# Patient Record
Sex: Female | Born: 1950 | Race: White | Hispanic: No | Smoking: Never smoker
Health system: Southern US, Community
[De-identification: ages and names within clinical notes are randomized; demographics above are authoritative.]

## PROBLEM LIST (undated history)

## (undated) DIAGNOSIS — R739 Hyperglycemia, unspecified: Secondary | ICD-10-CM

## (undated) DIAGNOSIS — C4490 Unspecified malignant neoplasm of skin, unspecified: Secondary | ICD-10-CM

## (undated) DIAGNOSIS — K219 Gastro-esophageal reflux disease without esophagitis: Secondary | ICD-10-CM

## (undated) DIAGNOSIS — K635 Polyp of colon: Secondary | ICD-10-CM

## (undated) DIAGNOSIS — E559 Vitamin D deficiency, unspecified: Secondary | ICD-10-CM

## (undated) HISTORY — PX: DEBRIDEMENT TENNIS ELBOW: SHX1442

## (undated) HISTORY — PX: EYE SURGERY: SHX253

## (undated) HISTORY — DX: Gastro-esophageal reflux disease without esophagitis: K21.9

## (undated) HISTORY — DX: Unspecified malignant neoplasm of skin, unspecified: C44.90

## (undated) HISTORY — DX: Hyperglycemia, unspecified: R73.9

## (undated) HISTORY — DX: Polyp of colon: K63.5

## (undated) HISTORY — PX: MOHS SURGERY: SUR867

## (undated) HISTORY — DX: Vitamin D deficiency, unspecified: E55.9

## (undated) HISTORY — PX: BREAST BIOPSY: SHX20

---
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
## 2005-04-12 ENCOUNTER — Ambulatory Visit: Payer: Self-pay | Admitting: Unknown Physician Specialty

## 2005-05-11 ENCOUNTER — Ambulatory Visit: Payer: Self-pay | Admitting: Obstetrics and Gynecology

## 2005-12-05 ENCOUNTER — Ambulatory Visit: Payer: Self-pay | Admitting: Unknown Physician Specialty

## 2006-09-18 ENCOUNTER — Ambulatory Visit: Payer: Self-pay

## 2006-10-04 ENCOUNTER — Ambulatory Visit: Payer: Self-pay | Admitting: Physician Assistant

## 2006-10-29 ENCOUNTER — Encounter: Payer: Self-pay | Admitting: General Practice

## 2007-12-06 ENCOUNTER — Ambulatory Visit: Payer: Self-pay

## 2007-12-24 ENCOUNTER — Ambulatory Visit: Payer: Self-pay | Admitting: Otolaryngology

## 2007-12-25 ENCOUNTER — Ambulatory Visit: Payer: Self-pay

## 2008-12-30 ENCOUNTER — Ambulatory Visit: Payer: Self-pay

## 2010-03-29 ENCOUNTER — Ambulatory Visit: Payer: Self-pay

## 2011-05-22 ENCOUNTER — Ambulatory Visit: Payer: Self-pay

## 2011-07-20 ENCOUNTER — Ambulatory Visit: Payer: Self-pay | Admitting: Unknown Physician Specialty

## 2011-07-21 LAB — PATHOLOGY REPORT

## 2012-04-01 LAB — BASIC METABOLIC PANEL
BUN: 16 mg/dL (ref 4–21)
Sodium: 140 mmol/L (ref 137–147)

## 2012-04-01 LAB — LIPID PANEL
Cholesterol: 192 mg/dL (ref 0–200)
HDL: 54 mg/dL (ref 35–70)
Triglycerides: 75 mg/dL (ref 40–160)

## 2012-04-01 LAB — HM PAP SMEAR

## 2012-04-01 LAB — HEPATIC FUNCTION PANEL: Bilirubin, Total: 0.4 mg/dL

## 2012-05-28 ENCOUNTER — Ambulatory Visit: Payer: Self-pay

## 2012-08-02 ENCOUNTER — Encounter: Payer: Self-pay | Admitting: *Deleted

## 2012-08-05 ENCOUNTER — Ambulatory Visit: Payer: Self-pay | Admitting: Internal Medicine

## 2012-08-12 ENCOUNTER — Ambulatory Visit (INDEPENDENT_AMBULATORY_CARE_PROVIDER_SITE_OTHER): Payer: Managed Care, Other (non HMO) | Admitting: Internal Medicine

## 2012-08-12 ENCOUNTER — Encounter: Payer: Self-pay | Admitting: Internal Medicine

## 2012-08-12 VITALS — BP 122/76 | HR 70 | Temp 98.5°F | Ht 64.0 in | Wt 177.0 lb

## 2012-08-12 DIAGNOSIS — Z13220 Encounter for screening for lipoid disorders: Secondary | ICD-10-CM

## 2012-08-12 DIAGNOSIS — K219 Gastro-esophageal reflux disease without esophagitis: Secondary | ICD-10-CM

## 2012-08-12 DIAGNOSIS — R739 Hyperglycemia, unspecified: Secondary | ICD-10-CM

## 2012-08-12 DIAGNOSIS — Z86010 Personal history of colonic polyps: Secondary | ICD-10-CM

## 2012-08-12 DIAGNOSIS — R7309 Other abnormal glucose: Secondary | ICD-10-CM

## 2012-08-12 DIAGNOSIS — E559 Vitamin D deficiency, unspecified: Secondary | ICD-10-CM

## 2012-08-12 DIAGNOSIS — C4490 Unspecified malignant neoplasm of skin, unspecified: Secondary | ICD-10-CM

## 2012-08-13 ENCOUNTER — Encounter: Payer: Self-pay | Admitting: Internal Medicine

## 2012-08-13 DIAGNOSIS — K219 Gastro-esophageal reflux disease without esophagitis: Secondary | ICD-10-CM | POA: Insufficient documentation

## 2012-08-13 DIAGNOSIS — Z86010 Personal history of colonic polyps: Secondary | ICD-10-CM | POA: Insufficient documentation

## 2012-08-13 DIAGNOSIS — R739 Hyperglycemia, unspecified: Secondary | ICD-10-CM | POA: Insufficient documentation

## 2012-08-13 DIAGNOSIS — E559 Vitamin D deficiency, unspecified: Secondary | ICD-10-CM | POA: Insufficient documentation

## 2012-08-13 DIAGNOSIS — C4490 Unspecified malignant neoplasm of skin, unspecified: Secondary | ICD-10-CM | POA: Insufficient documentation

## 2012-08-13 NOTE — Assessment & Plan Note (Signed)
Symptoms controlled if she watches what she eats.  Takes ranitidine.  Follow.

## 2012-08-13 NOTE — Assessment & Plan Note (Signed)
Followed by dermatology

## 2012-08-13 NOTE — Assessment & Plan Note (Signed)
Check vitamin D level 

## 2012-08-13 NOTE — Progress Notes (Signed)
  Subjective:    Patient ID: Lori Guerrero, female    DOB: 1950/10/06, 62 y.o.   MRN: [id number]  HPI 62 year old female with past history of GERD, hyperglycemia and colonic polyps who comes in today to follow up on these issues as well as to establish care.  Has seen Dr Marguerite Olea previously.  Also sees GYN.  (seeing Acquanetta Belling).  Up to date with her gyn exams.  Last pelvic - 9/13.  Has never had an abnormal pap smear.  She reports noticing some occasional heart fluttering.  Rare.  Has had a stress test previously.  Negative.  No change in symptoms.  No sob.  No chest pain.  No acid reflux now.  Was previously on medication.  If she watches what she eats, no symptoms.  Has some issues with her left knee.  Has seen Dr Ernest Pine.   Takes glucosamine.  Helps.  Weight bearing - bothers.  Bowels regular.     Past Medical History  Diagnosis Date  . GERD (gastroesophageal reflux disease)   . Hyperglycemia   . Vitamin D deficiency   . Hyperplastic colon polyp   . Skin cancer     Current Outpatient Prescriptions on File Prior to Visit  Medication Sig Dispense Refill  . Multiple Vitamins-Minerals (MULTIVITAMIN PO) Take by mouth daily.      . naproxen sodium (ANAPROX) 220 MG tablet Take 220 mg by mouth as needed. 1 to 2 capsules as needed      . ranitidine (ZANTAC) 150 MG capsule Take 150 mg by mouth daily as needed.        No current facility-administered medications on file prior to visit.    Review of Systems Patient denies any headache, lightheadedness or dizziness.  No sinus or allergy symptoms.  No chest pain, tightness or significant palpitations.  No increased shortness of breath, cough or congestion.  No nausea or vomiting.  No significant acid reflux.  No abdominal pain or cramping.  No bowel change, such as diarrhea, constipation, BRBPR or melana.  No urine change.        Objective:   Physical Exam Filed Vitals:   08/12/12 0934  BP: 122/76  Pulse: 70  Temp: 98.5 F (66.52 C)   62  year old female in no acute distress.   HEENT:  Nares- clear.  Oropharynx - without lesions. NECK:  Supple.  Nontender.  No audible bruit.  HEART:  Appears to be regular. LUNGS:  No crackles or wheezing audible.  Respirations even and unlabored.  RADIAL PULSE:  Equal bilaterally.    BREASTS: performed through gyn.  ABDOMEN:  Soft, nontender.  Bowel sounds present and normal.  No audible abdominal bruit.  GU:  Performed through gyn.    EXTREMITIES:  No increased edema present.  DP pulses palpable and equal bilaterally.       MSK:  No significant pain to palpation over the knee.  No swelling.      Assessment & Plan:  MSK.  Intermittent knee pain.  Has seen Dr Ernest Pine.  Stable.  Desires no further intervention.   CARDIOVASCULAR.  Has had stress test.  Negative.  Occasional fluttering.  Rare.  No recent change in symptoms.  Follow.    HEALTH MAINTENANCE.  Had her physical with gyn 9/13.  Mammogram 05/28/12.  Colonoscopy 07/20/11.  Due follow up colonoscopy 2018.

## 2012-08-13 NOTE — Assessment & Plan Note (Signed)
Low carb diet and exercise.  Check metabolic panel and a1c.   

## 2012-08-13 NOTE — Assessment & Plan Note (Signed)
Up to date with colonoscopy.  See above.  Due follow up colonoscopy 2018.

## 2012-08-14 ENCOUNTER — Other Ambulatory Visit (INDEPENDENT_AMBULATORY_CARE_PROVIDER_SITE_OTHER): Payer: Managed Care, Other (non HMO)

## 2012-08-14 DIAGNOSIS — R7309 Other abnormal glucose: Secondary | ICD-10-CM

## 2012-08-14 DIAGNOSIS — Z13220 Encounter for screening for lipoid disorders: Secondary | ICD-10-CM

## 2012-08-14 DIAGNOSIS — R739 Hyperglycemia, unspecified: Secondary | ICD-10-CM

## 2012-08-14 LAB — CBC WITH DIFFERENTIAL/PLATELET
Basophils Absolute: 0 10*3/uL (ref 0.0–0.1)
HCT: 43.8 % (ref 36.0–46.0)
Hemoglobin: 15.1 g/dL — ABNORMAL HIGH (ref 12.0–15.0)
Lymphs Abs: 1.3 10*3/uL (ref 0.7–4.0)
MCHC: 34.4 g/dL (ref 30.0–36.0)
MCV: 93.3 fl (ref 78.0–100.0)
Monocytes Absolute: 0.3 10*3/uL (ref 0.1–1.0)
Monocytes Relative: 5.6 % (ref 3.0–12.0)
Neutro Abs: 3 10*3/uL (ref 1.4–7.7)
Platelets: 253 10*3/uL (ref 150.0–400.0)
RDW: 13.2 % (ref 11.5–14.6)

## 2012-08-14 LAB — BASIC METABOLIC PANEL
CO2: 27 mEq/L (ref 19–32)
Calcium: 9.4 mg/dL (ref 8.4–10.5)
Creatinine, Ser: 1 mg/dL (ref 0.4–1.2)
GFR: 62.16 mL/min (ref >60.00)
Sodium: 139 mEq/L (ref 135–145)

## 2012-08-14 LAB — LIPID PANEL
Cholesterol: 200 mg/dL (ref 0–200)
LDL Cholesterol: 128 mg/dL — ABNORMAL HIGH (ref 0–99)
Total CHOL/HDL Ratio: 4

## 2012-08-19 ENCOUNTER — Encounter: Payer: Self-pay | Admitting: *Deleted

## 2012-08-26 ENCOUNTER — Telehealth: Payer: Self-pay | Admitting: Internal Medicine

## 2012-08-26 NOTE — Telephone Encounter (Signed)
Called patient back and gave her the number for billing.

## 2012-08-26 NOTE — Telephone Encounter (Signed)
Pt states she received a bill after we filed her visit with [employer].  Pt states ins states it is the way it was billed/coding issue.  Offered pt billing phone number but  She refused as she states it is something Dr. Stann Mainland have to code differently.  Please advise.  Please call pt on cell [phone].

## 2013-03-12 ENCOUNTER — Ambulatory Visit: Payer: Managed Care, Other (non HMO) | Admitting: Internal Medicine

## 2013-04-06 ENCOUNTER — Encounter: Payer: Self-pay | Admitting: Internal Medicine

## 2013-04-14 ENCOUNTER — Ambulatory Visit (INDEPENDENT_AMBULATORY_CARE_PROVIDER_SITE_OTHER): Payer: Managed Care, Other (non HMO) | Admitting: Internal Medicine

## 2013-04-14 ENCOUNTER — Encounter: Payer: Self-pay | Admitting: Internal Medicine

## 2013-04-14 VITALS — BP 122/80 | HR 64 | Temp 98.1°F | Ht 64.0 in | Wt 174.8 lb

## 2013-04-14 DIAGNOSIS — Z8379 Family history of other diseases of the digestive system: Secondary | ICD-10-CM

## 2013-04-14 DIAGNOSIS — Z860100 Personal history of colon polyps, unspecified: Secondary | ICD-10-CM

## 2013-04-14 DIAGNOSIS — C4490 Unspecified malignant neoplasm of skin, unspecified: Secondary | ICD-10-CM

## 2013-04-14 DIAGNOSIS — R109 Unspecified abdominal pain: Secondary | ICD-10-CM | POA: Insufficient documentation

## 2013-04-14 DIAGNOSIS — R7309 Other abnormal glucose: Secondary | ICD-10-CM

## 2013-04-14 DIAGNOSIS — R739 Hyperglycemia, unspecified: Secondary | ICD-10-CM

## 2013-04-14 DIAGNOSIS — E559 Vitamin D deficiency, unspecified: Secondary | ICD-10-CM

## 2013-04-14 DIAGNOSIS — K219 Gastro-esophageal reflux disease without esophagitis: Secondary | ICD-10-CM

## 2013-04-14 DIAGNOSIS — R1030 Lower abdominal pain, unspecified: Secondary | ICD-10-CM | POA: Insufficient documentation

## 2013-04-14 LAB — BASIC METABOLIC PANEL
BUN: 17 mg/dL (ref 6–23)
Calcium: 9.1 mg/dL (ref 8.4–10.5)
Chloride: 109 mEq/L (ref 96–112)
GFR: 74.25 mL/min (ref >60.00)
Glucose, Bld: 109 mg/dL — ABNORMAL HIGH (ref 70–99)
Sodium: 142 mEq/L (ref 135–145)

## 2013-04-14 LAB — HEPATIC FUNCTION PANEL
ALT: 22 U/L (ref 0–35)
Albumin: 3.8 g/dL (ref 3.5–5.2)
Alkaline Phosphatase: 91 U/L (ref 39–117)
Total Bilirubin: 0.7 mg/dL (ref 0.3–1.2)

## 2013-04-14 LAB — HEMOGLOBIN A1C: Hgb A1c MFr Bld: 5.9 % (ref 4.6–6.5)

## 2013-04-14 NOTE — Assessment & Plan Note (Signed)
Followed by dermatology

## 2013-04-14 NOTE — Assessment & Plan Note (Signed)
Low carb diet and exercise.  Check metabolic panel and a1c.   

## 2013-04-14 NOTE — Progress Notes (Signed)
  Subjective:    Patient ID: Lori Guerrero, female    DOB: 10-Oct-1950, 62 y.o.   MRN: [id number]  HPI 62 year old female with past history of GERD, hyperglycemia and colonic polyps who comes in today for a scheduled follow up.  Sees GYN.  (seeing Acquanetta Belling).  Up to date with her gyn exams.  Last pelvic - 9/14.  Has never had an abnormal pap smear.  Has had a stress test previously.  Negative.  No change in symptoms.  No sob.  No chest pain.  No acid reflux now.  Was previously on medication.  If she watches what she eats, no symptoms.   Bowels regular.  She reports some intermittent lower abdominal pain.  Flares intermittently.  May last hours and then resolves.  No precipitating factors.  No bowel change associated.  Just evaluated by gyn.  States everything checked out fine.  She also reports her sister and daughter have some liver condition.  She does not know name.      Past Medical History  Diagnosis Date  . GERD (gastroesophageal reflux disease)   . Hyperglycemia   . Vitamin D deficiency   . Hyperplastic colon polyp   . Skin cancer     Current Outpatient Prescriptions on File Prior to Visit  Medication Sig Dispense Refill  . Multiple Vitamins-Minerals (MULTIVITAMIN PO) Take by mouth daily.      . naproxen sodium (ANAPROX) 220 MG tablet Take 220 mg by mouth as needed. 1 to 2 capsules as needed      . ranitidine (ZANTAC) 150 MG capsule Take 150 mg by mouth daily as needed.       Marland Kitchen GARCINIA CAMBOGIA-CHROMIUM PO Take 2 by mouth three times a day       No current facility-administered medications on file prior to visit.    Review of Systems Patient denies any headache, lightheadedness or dizziness.  No sinus or allergy symptoms.  No chest pain, tightness or palpitations.  No increased shortness of breath, cough or congestion.  No nausea or vomiting.  No significant acid reflux. Intermittent flares of lower abdominal discomfort.   No bowel change, such as diarrhea, constipation, BRBPR  or melana.  No urine change.  Recent gyn evaluation ok.      Objective:   Physical Exam  Filed Vitals:   04/14/13 0827  BP: 122/80  Pulse: 64  Temp: 98.1 F (36.7 C)   Blood pressure recheck:  120/76, pulse 38  62 year old female in no acute distress.   HEENT:  Nares- clear.  Oropharynx - without lesions. NECK:  Supple.  Nontender.  No audible bruit.  HEART:  Appears to be regular. LUNGS:  No crackles or wheezing audible.  Respirations even and unlabored.  RADIAL PULSE:  Equal bilaterally.   ABDOMEN:  Soft, nontender.  Bowel sounds present and normal.  No audible abdominal bruit.   EXTREMITIES:  No increased edema present.  DP pulses palpable and equal bilaterally.      MSK:  No significant pain to palpation over the knee.  No swelling.      Assessment & Plan:  MSK.  Intermittent knee pain.  Has seen Dr Ernest Pine.  Stable.  Desires no further intervention.   CARDIOVASCULAR.  Has had stress test.  Negative.  No recent change in symptoms.  Follow.    HEALTH MAINTENANCE.  Had her physical with gyn 9/14.  Mammogram 05/28/12.  Colonoscopy 07/20/11.  Due follow up colonoscopy 2018.

## 2013-04-14 NOTE — Assessment & Plan Note (Signed)
Persistent intermittent pain.  Last episode - last week.  Discussed further w/up including pelvic ultrasound.  She thinks she has had a recent pelvic ultrasound and wants to hold on further w/up at this time.  Will notify me if she changes her mind.  Will monitor for possible triggers.

## 2013-04-14 NOTE — Assessment & Plan Note (Signed)
Check vitamin D level 

## 2013-04-14 NOTE — Assessment & Plan Note (Signed)
Up to date with colonoscopy.  See above.  Due follow up colonoscopy 2018.

## 2013-04-14 NOTE — Assessment & Plan Note (Signed)
Symptoms controlled if she watches what she eats.  Takes ranitidine.  Follow.

## 2013-04-15 ENCOUNTER — Encounter: Payer: Self-pay | Admitting: Internal Medicine

## 2013-04-21 NOTE — Telephone Encounter (Signed)
Mailed unread message to pt  

## 2013-04-24 ENCOUNTER — Telehealth: Payer: Self-pay | Admitting: Internal Medicine

## 2013-04-24 NOTE — Telephone Encounter (Signed)
noted 

## 2013-04-24 NOTE — Telephone Encounter (Signed)
Patient Information:  Caller Name: Matti  Phone: [phone]  Patient: Lori Guerrero, Lori Guerrero  Gender: Female  DOB: [birth year]  Age: 62 Years  PCP: Dale Isleton  Office Follow Up:  Does the office need to follow up with this patient?: No  Instructions For The Office: N/A   Symptoms  Reason For Call & Symptoms: Patient calling, Reports she has insect bite  on right foot from  10/18; it is swollen and itchy. Dime sized red puffy area reported.  She mentioned that she had  been pulling weeds in the yard when this happened.  Emergent symptoms ruled out.  Home care per Insect Bite guideline due to Itchy Insect bite.  Caller voiced understanding.  Reviewed Health History In EMR: Yes  Reviewed Medications In EMR: Yes  Reviewed Allergies In EMR: Yes  Reviewed Surgeries / Procedures: Yes  Date of Onset of Symptoms: 04/19/2013  Treatments Tried: Witch Hazel  Treatments Tried Worked: No  Guideline(s) Used:  IT sales professional  Disposition Per Guideline:   Home Care  Reason For Disposition Reached:   Itchy insect bite  Advice Given:  Local Treatment - Itchy Insect Bites  Apply calamine lotion or a baking soda paste.  If the itch is severe, use 1% hydrocortisone cream. Apply 4 times a day until the itch is less severe, then switch to calamine lotion.  Oral Antihistamine Medication for Severe Itching:   Antihistamines may cause sleepiness. Do not drink, drive, or operate dangerous machinery while taking antihistamines.  Expected Course:  Most insect bites are itchy and puffy for several days.  Call Back If:  Severe pain lasts over 2 hours after pain medicine  Bite looks infected (redness, red streaks, increased tenderness)  Redness getting larger and more than 48 hours after the bite  Infected scab doesn't look better after 48 hours of antibiotic ointment  You become worse.  Patient Will Follow Care Advice:  YES

## 2013-04-24 NOTE — Telephone Encounter (Signed)
FYI

## 2013-04-25 ENCOUNTER — Encounter: Payer: Self-pay | Admitting: Internal Medicine

## 2013-05-12 NOTE — Telephone Encounter (Signed)
Mailed unread message to pt  

## 2013-06-10 ENCOUNTER — Ambulatory Visit: Payer: Self-pay

## 2014-04-17 ENCOUNTER — Other Ambulatory Visit: Payer: Self-pay

## 2014-06-23 ENCOUNTER — Ambulatory Visit: Payer: Self-pay

## 2014-06-23 LAB — HM MAMMOGRAPHY: HM Mammogram: NEGATIVE

## 2015-04-05 ENCOUNTER — Encounter: Payer: Self-pay | Admitting: Internal Medicine

## 2015-04-05 ENCOUNTER — Ambulatory Visit (INDEPENDENT_AMBULATORY_CARE_PROVIDER_SITE_OTHER): Payer: Managed Care, Other (non HMO) | Admitting: Internal Medicine

## 2015-04-05 VITALS — BP 120/80 | HR 50 | Temp 98.2°F | Ht 63.75 in | Wt 170.5 lb

## 2015-04-05 DIAGNOSIS — R739 Hyperglycemia, unspecified: Secondary | ICD-10-CM | POA: Diagnosis not present

## 2015-04-05 DIAGNOSIS — K219 Gastro-esophageal reflux disease without esophagitis: Secondary | ICD-10-CM | POA: Diagnosis not present

## 2015-04-05 DIAGNOSIS — E559 Vitamin D deficiency, unspecified: Secondary | ICD-10-CM

## 2015-04-05 DIAGNOSIS — Z658 Other specified problems related to psychosocial circumstances: Secondary | ICD-10-CM | POA: Diagnosis not present

## 2015-04-05 DIAGNOSIS — Z0000 Encounter for general adult medical examination without abnormal findings: Secondary | ICD-10-CM

## 2015-04-05 DIAGNOSIS — F439 Reaction to severe stress, unspecified: Secondary | ICD-10-CM | POA: Insufficient documentation

## 2015-04-05 DIAGNOSIS — Z86010 Personal history of colonic polyps: Secondary | ICD-10-CM

## 2015-04-05 MED ORDER — ALPRAZOLAM 0.25 MG PO TABS: 0.2500 mg | ORAL_TABLET | Freq: Every day | ORAL | Status: DC | PRN

## 2015-04-05 NOTE — Assessment & Plan Note (Addendum)
Low carb diet and exercise.  Follow met b and a1c.  Has adjusted her diet.  Lost weight.   

## 2015-04-05 NOTE — Progress Notes (Signed)
Patient ID: Lori Guerrero, female   DOB: 14-Feb-1951, 64 y.o.   MRN: [id number]   Subjective:    Patient ID: Lori Guerrero, female    DOB: 07-Apr-1951, 64 y.o.   MRN: [id number]  HPI  Patient with past history of GERD, hyperglycemia and colonic polyps.  She comes in today to follow up on these issues as well as for a complete physical exam.  She reports she is doing relatively well.  Her husband of 35 years passed away 16-Nov-2013.  She still has some grief over this.  Increased stress at work.  The best way she deals with stress is exercise.  No chest pain or tightness.  No sob. No acid reflux symptoms reported.  No abdominal pain or cramping.  No bowel change.  States at times she feels she can't remember as well, but relates this more to concentration and stress.  Feels needs some occasional xanax to have if needed.     Past Medical History  Diagnosis Date  . GERD (gastroesophageal reflux disease)   . Hyperglycemia   . Vitamin D deficiency   . Hyperplastic colon polyp   . Skin cancer    Past Surgical History  Procedure Laterality Date  . Debridement tennis elbow    . Breast biopsy    . Mohs surgery      skin cancer   Family History  Problem Relation Age of Onset  . Diabetes Mother   . Heart disease Mother   . Cancer Paternal Grandfather     colon  . Cancer Paternal Aunt     Breast cancer   Social History   Social History  . Marital Status: Widowed    Spouse Name: N/A  . Number of Children: 1  . Years of Education: N/A   Social History Main Topics  . Smoking status: Never Smoker   . Smokeless tobacco: Never Used  . Alcohol Use: 0.0 oz/week    0 Standard drinks or equivalent per week     Comment: occassionally  . Drug Use: No  . Sexual Activity: Not Asked   Other Topics Concern  . None   Social History Narrative    Outpatient Encounter Prescriptions as of 04/05/2015  Medication Sig  . Multiple Vitamins-Minerals (MULTIVITAMIN PO) Take by mouth daily.  .  naproxen sodium (ANAPROX) 220 MG tablet Take 220 mg by mouth as needed. 1 to 2 capsules as needed  . ranitidine (ZANTAC) 150 MG capsule Take 150 mg by mouth daily as needed.   . ALPRAZolam (XANAX) 0.25 MG tablet Take 1 tablet (0.25 mg total) by mouth daily as needed for anxiety.  Marland Kitchen GARCINIA CAMBOGIA-CHROMIUM PO Take 2 by mouth three times a day   No facility-administered encounter medications on file as of 04/05/2015.    Review of Systems  Constitutional: Negative for appetite change and unexpected weight change.  HENT: Negative for congestion and sinus pressure.   Eyes: Negative for pain and visual disturbance.  Respiratory: Negative for cough, chest tightness and shortness of breath.   Cardiovascular: Negative for chest pain, palpitations and leg swelling.  Gastrointestinal: Negative for nausea, vomiting, abdominal pain and diarrhea.  Genitourinary: Negative for frequency and difficulty urinating.  Musculoskeletal: Negative for back pain and joint swelling.  Skin: Negative for color change and rash.  Neurological: Negative for dizziness, light-headedness and headaches.  Hematological: Negative for adenopathy. Does not bruise/bleed easily.  Psychiatric/Behavioral: Negative for dysphoric mood and agitation.  Increased stress as outlined.         Objective:    Physical Exam  Constitutional: She is oriented to person, place, and time. She appears well-developed and well-nourished.  HENT:  Nose: Nose normal.  Mouth/Throat: Oropharynx is clear and moist.  Eyes: Right eye exhibits no discharge. Left eye exhibits no discharge. No scleral icterus.  Neck: Neck supple. No thyromegaly present.  Cardiovascular: Normal rate and regular rhythm.   Pulmonary/Chest: Breath sounds normal. No accessory muscle usage. No tachypnea. No respiratory distress. She has no decreased breath sounds. She has no wheezes. She has no rhonchi. Right breast exhibits no inverted nipple, no mass, no nipple  discharge and no tenderness (no axillary adenopathy). Left breast exhibits no inverted nipple, no mass, no nipple discharge and no tenderness (no axilarry adenopathy).  Abdominal: Soft. Bowel sounds are normal. There is no tenderness.  Genitourinary:  Performed by gyn.   Musculoskeletal: She exhibits no edema or tenderness.  Lymphadenopathy:    She has no cervical adenopathy.  Neurological: She is alert and oriented to person, place, and time.  Skin: Skin is warm. No rash noted. No erythema.  Psychiatric: She has a normal mood and affect. Her behavior is normal.    BP 120/80 mmHg  Pulse 50  Temp(Src) 98.2 F (36.8 C) (Oral)  Ht 5' 3.75" (1.619 m)  Wt 170 lb 8 oz (77.338 kg)  BMI 29.51 kg/m2  SpO2 98% Wt Readings from Last 3 Encounters:  04/05/15 170 lb 8 oz (77.338 kg)  04/14/13 174 lb 12 oz (79.266 kg)  08/12/12 177 lb (80.287 kg)     Lab Results  Component Value Date   WBC 4.7 08/14/2012   HGB 15.1* 08/14/2012   HCT 43.8 08/14/2012   PLT 253.0 08/14/2012   GLUCOSE 109* 04/14/2013   CHOL 200 08/14/2012   TRIG 112.0 08/14/2012   HDL 50.00 08/14/2012   LDLCALC 128* 08/14/2012   ALT 22 04/14/2013   AST 23 04/14/2013   NA 142 04/14/2013   K 3.9 04/14/2013   CL 109 04/14/2013   CREATININE 0.8 04/14/2013   BUN 17 04/14/2013   CO2 25 04/14/2013   HGBA1C 5.9 04/14/2013       Assessment & Plan:   Problem List Items Addressed This Visit    GERD (gastroesophageal reflux disease) - Primary    On zantac.  Upper symptoms controlled.        Health care maintenance    Was scheduled for her physical today.  Gets her pelvic, pap smears and mammograms through [facility].  Obtain records.  States she is up to date.  Follow.  Will let me know if wants to start getting physicals here.  Colonoscopy 07/20/11.  F/u colonoscopy recommended 2018.        Hyperglycemia    Low carb diet and exercise.  Follow met b and a1c.  Has adjusted her diet.  Lost weight.        Personal history  of colonic polyps    Up to date with colonoscopy.  Last 07/20/11.  Due f/u colonoscopy 2018.        Stress    Increased stress as outlined.  Gave her [employer] - for counseling.  rx for xanax to have if needed.        Vitamin D deficiency    Recheck vitamin D level with next labs.           Einar Pheasant, MD

## 2015-04-05 NOTE — Assessment & Plan Note (Signed)
Increased stress as outlined.  Gave her [employer] - for counseling.  rx for xanax to have if needed.

## 2015-04-05 NOTE — Progress Notes (Signed)
Pre-visit discussion using our clinic review tool. No additional management support is needed unless otherwise documented below in the visit note.  

## 2015-04-05 NOTE — Assessment & Plan Note (Signed)
On zantac.  Upper symptoms controlled.

## 2015-04-05 NOTE — Assessment & Plan Note (Signed)
Recheck vitamin D level with next labs.  

## 2015-04-05 NOTE — Assessment & Plan Note (Signed)
Was scheduled for her physical today.  Gets her pelvic, pap smears and mammograms through [facility].  Obtain records.  States she is up to date.  Follow.  Will let me know if wants to start getting physicals here.  Colonoscopy 07/20/11.  F/u colonoscopy recommended 2018.

## 2015-04-05 NOTE — Assessment & Plan Note (Signed)
Up to date with colonoscopy.  Last 07/20/11.  Due f/u colonoscopy 2018.

## 2015-04-13 ENCOUNTER — Encounter: Payer: Self-pay | Admitting: Internal Medicine

## 2015-04-15 ENCOUNTER — Other Ambulatory Visit: Payer: Managed Care, Other (non HMO)

## 2015-04-22 ENCOUNTER — Other Ambulatory Visit (INDEPENDENT_AMBULATORY_CARE_PROVIDER_SITE_OTHER): Payer: Managed Care, Other (non HMO)

## 2015-04-22 ENCOUNTER — Telehealth: Payer: Self-pay | Admitting: *Deleted

## 2015-04-22 DIAGNOSIS — K219 Gastro-esophageal reflux disease without esophagitis: Secondary | ICD-10-CM | POA: Diagnosis not present

## 2015-04-22 DIAGNOSIS — Z13220 Encounter for screening for lipoid disorders: Secondary | ICD-10-CM

## 2015-04-22 DIAGNOSIS — R739 Hyperglycemia, unspecified: Secondary | ICD-10-CM | POA: Diagnosis not present

## 2015-04-22 DIAGNOSIS — E559 Vitamin D deficiency, unspecified: Secondary | ICD-10-CM

## 2015-04-22 LAB — HEMOGLOBIN A1C: HEMOGLOBIN A1C: 5.7 % (ref 4.6–6.5)

## 2015-04-22 LAB — CBC WITH DIFFERENTIAL/PLATELET
BASOS PCT: 0.7 % (ref 0.0–3.0)
Basophils Absolute: 0 10*3/uL (ref 0.0–0.1)
EOS ABS: 0.1 10*3/uL (ref 0.0–0.7)
Eosinophils Relative: 1.6 % (ref 0.0–5.0)
HEMATOCRIT: 42.5 % (ref 36.0–46.0)
Hemoglobin: 14.5 g/dL (ref 12.0–15.0)
LYMPHS PCT: 27 % (ref 12.0–46.0)
Lymphs Abs: 1.2 10*3/uL (ref 0.7–4.0)
MCHC: 34 g/dL (ref 30.0–36.0)
MCV: 94.3 fl (ref 78.0–100.0)
Monocytes Absolute: 0.2 10*3/uL (ref 0.1–1.0)
Monocytes Relative: 5.1 % (ref 3.0–12.0)
NEUTROS ABS: 3 10*3/uL (ref 1.4–7.7)
Neutrophils Relative %: 65.6 % (ref 43.0–77.0)
Platelets: 244 10*3/uL (ref 150.0–400.0)
RBC: 4.51 Mil/uL (ref 3.87–5.11)
RDW: 12.8 % (ref 11.5–15.5)
WBC: 4.5 10*3/uL (ref 4.0–10.5)

## 2015-04-22 LAB — BASIC METABOLIC PANEL
BUN: 17 mg/dL (ref 6–23)
CO2: 23 mEq/L (ref 19–32)
Calcium: 9.3 mg/dL (ref 8.4–10.5)
Chloride: 106 mEq/L (ref 96–112)
Creatinine, Ser: 0.82 mg/dL (ref 0.40–1.20)
GFR: 74.8 mL/min (ref >60.00)
Glucose, Bld: 97 mg/dL (ref 70–99)
POTASSIUM: 4.1 meq/L (ref 3.5–5.1)
SODIUM: 140 meq/L (ref 135–145)

## 2015-04-22 LAB — LIPID PANEL
CHOLESTEROL: 208 mg/dL — AB (ref 0–200)
HDL: 57.9 mg/dL (ref >39.00)
LDL Cholesterol: 134 mg/dL — ABNORMAL HIGH (ref 0–99)
NONHDL: 149.61
TRIGLYCERIDES: 77 mg/dL (ref 0.0–149.0)
Total CHOL/HDL Ratio: 4
VLDL: 15.4 mg/dL (ref 0.0–40.0)

## 2015-04-22 LAB — HEPATIC FUNCTION PANEL
ALBUMIN: 4.2 g/dL (ref 3.5–5.2)
ALK PHOS: 103 U/L (ref 39–117)
ALT: 22 U/L (ref 0–35)
AST: 23 U/L (ref 0–37)
Bilirubin, Direct: 0.1 mg/dL (ref 0.0–0.3)
TOTAL PROTEIN: 6.5 g/dL (ref 6.0–8.3)
Total Bilirubin: 0.5 mg/dL (ref 0.2–1.2)

## 2015-04-22 NOTE — Telephone Encounter (Signed)
Order placed for labs.

## 2015-04-22 NOTE — Telephone Encounter (Signed)
Labs and dx?  

## 2015-04-23 ENCOUNTER — Encounter: Payer: Self-pay | Admitting: Internal Medicine

## 2015-04-23 LAB — VITAMIN D 25 HYDROXY (VIT D DEFICIENCY, FRACTURES): VITD: 38.6 ng/mL (ref 30.00–100.00)

## 2015-04-23 LAB — TSH: TSH: 1.39 u[IU]/mL (ref 0.35–4.50)

## 2015-07-09 ENCOUNTER — Ambulatory Visit: Payer: Managed Care, Other (non HMO) | Admitting: Internal Medicine

## 2015-11-22 ENCOUNTER — Encounter: Payer: Self-pay | Admitting: Internal Medicine

## 2015-11-22 NOTE — Telephone Encounter (Signed)
Patient was called and stated the area is above anal sphincter. She stated that pain and bleeding have been happening for 2 weeks. I scheduled her for tomorrow at 530 p.m with Mable Paris, NP.

## 2015-11-23 ENCOUNTER — Ambulatory Visit: Payer: Managed Care, Other (non HMO) | Admitting: Family

## 2015-11-25 ENCOUNTER — Ambulatory Visit: Payer: Managed Care, Other (non HMO) | Admitting: Internal Medicine

## 2016-04-11 ENCOUNTER — Encounter: Payer: Managed Care, Other (non HMO) | Admitting: Internal Medicine

## 2016-04-25 ENCOUNTER — Encounter: Payer: Self-pay | Admitting: Internal Medicine

## 2016-04-25 ENCOUNTER — Ambulatory Visit (INDEPENDENT_AMBULATORY_CARE_PROVIDER_SITE_OTHER): Payer: Managed Care, Other (non HMO) | Admitting: Internal Medicine

## 2016-04-25 VITALS — BP 120/78 | HR 70 | Temp 98.4°F | Ht 63.5 in | Wt 175.0 lb

## 2016-04-25 DIAGNOSIS — Z0000 Encounter for general adult medical examination without abnormal findings: Secondary | ICD-10-CM

## 2016-04-25 DIAGNOSIS — F439 Reaction to severe stress, unspecified: Secondary | ICD-10-CM

## 2016-04-25 DIAGNOSIS — K625 Hemorrhage of anus and rectum: Secondary | ICD-10-CM

## 2016-04-25 DIAGNOSIS — Z124 Encounter for screening for malignant neoplasm of cervix: Secondary | ICD-10-CM

## 2016-04-25 DIAGNOSIS — R739 Hyperglycemia, unspecified: Secondary | ICD-10-CM | POA: Diagnosis not present

## 2016-04-25 DIAGNOSIS — Z1231 Encounter for screening mammogram for malignant neoplasm of breast: Secondary | ICD-10-CM | POA: Diagnosis not present

## 2016-04-25 DIAGNOSIS — E559 Vitamin D deficiency, unspecified: Secondary | ICD-10-CM | POA: Diagnosis not present

## 2016-04-25 DIAGNOSIS — Z1159 Encounter for screening for other viral diseases: Secondary | ICD-10-CM

## 2016-04-25 DIAGNOSIS — Z86010 Personal history of colonic polyps: Secondary | ICD-10-CM

## 2016-04-25 DIAGNOSIS — Z1211 Encounter for screening for malignant neoplasm of colon: Secondary | ICD-10-CM

## 2016-04-25 DIAGNOSIS — Z1239 Encounter for other screening for malignant neoplasm of breast: Secondary | ICD-10-CM

## 2016-04-25 LAB — CBC WITH DIFFERENTIAL/PLATELET
BASOS PCT: 0.6 % (ref 0.0–3.0)
Basophils Absolute: 0 10*3/uL (ref 0.0–0.1)
EOS ABS: 0.1 10*3/uL (ref 0.0–0.7)
Eosinophils Relative: 2.2 % (ref 0.0–5.0)
HCT: 41.6 % (ref 36.0–46.0)
Hemoglobin: 14.3 g/dL (ref 12.0–15.0)
LYMPHS ABS: 1 10*3/uL (ref 0.7–4.0)
Lymphocytes Relative: 21.1 % (ref 12.0–46.0)
MCHC: 34.4 g/dL (ref 30.0–36.0)
MCV: 93.7 fl (ref 78.0–100.0)
MONO ABS: 0.2 10*3/uL (ref 0.1–1.0)
Monocytes Relative: 4.8 % (ref 3.0–12.0)
NEUTROS ABS: 3.3 10*3/uL (ref 1.4–7.7)
Neutrophils Relative %: 71.3 % (ref 43.0–77.0)
PLATELETS: 249 10*3/uL (ref 150.0–400.0)
RBC: 4.44 Mil/uL (ref 3.87–5.11)
RDW: 13.2 % (ref 11.5–15.5)
WBC: 4.6 10*3/uL (ref 4.0–10.5)

## 2016-04-25 LAB — LIPID PANEL
CHOL/HDL RATIO: 3
CHOLESTEROL: 189 mg/dL (ref 0–200)
HDL: 54.1 mg/dL (ref >39.00)
LDL Cholesterol: 113 mg/dL — ABNORMAL HIGH (ref 0–99)
NonHDL: 134.75
TRIGLYCERIDES: 108 mg/dL (ref 0.0–149.0)
VLDL: 21.6 mg/dL (ref 0.0–40.0)

## 2016-04-25 LAB — COMPREHENSIVE METABOLIC PANEL
ALT: 19 U/L (ref 0–35)
AST: 20 U/L (ref 0–37)
Albumin: 4.1 g/dL (ref 3.5–5.2)
Alkaline Phosphatase: 95 U/L (ref 39–117)
BUN: 20 mg/dL (ref 6–23)
CHLORIDE: 108 meq/L (ref 96–112)
CO2: 25 meq/L (ref 19–32)
CREATININE: 0.82 mg/dL (ref 0.40–1.20)
Calcium: 9.4 mg/dL (ref 8.4–10.5)
GFR: 74.56 mL/min (ref >60.00)
Glucose, Bld: 103 mg/dL — ABNORMAL HIGH (ref 70–99)
Potassium: 4.1 mEq/L (ref 3.5–5.1)
SODIUM: 139 meq/L (ref 135–145)
Total Bilirubin: 0.4 mg/dL (ref 0.2–1.2)
Total Protein: 6.6 g/dL (ref 6.0–8.3)

## 2016-04-25 LAB — TSH: TSH: 1.67 u[IU]/mL (ref 0.35–4.50)

## 2016-04-25 LAB — VITAMIN D 25 HYDROXY (VIT D DEFICIENCY, FRACTURES): VITD: 36.47 ng/mL (ref 30.00–100.00)

## 2016-04-25 LAB — HEMOGLOBIN A1C: Hgb A1c MFr Bld: 5.7 % (ref 4.6–6.5)

## 2016-04-25 NOTE — Progress Notes (Signed)
Pre visit review using our clinic review tool, if applicable. No additional management support is needed unless otherwise documented below in the visit note. 

## 2016-04-25 NOTE — Assessment & Plan Note (Signed)
Was scheduled for physical today 04/25/16.  Gets her breast exams, mammograms and pap smears at [facility]  Obtain recoreds.  Colonoscopy 07/20/11.  Recommended f/u in 07/2016.  Overdue mammogram per our records.

## 2016-04-25 NOTE — Progress Notes (Signed)
Patient ID: KIERSTEN BLAESER, female   DOB: June 20, 1951, 65 y.o.   MRN: [id number]   Subjective:    Patient ID: Lori Guerrero, female    DOB: 11/05/1950, 65 y.o.   MRN: [id number]  HPI  Patient here for a physical exam.  She is doing relatively well.  Tries to stay active.  No chest pain.  No sob.  No acid reflux.  No abdominal pain or cramping.  Bowels stable.  Has noticed blood in her stool at times.  Due for colonoscopy.  Gets her gyn exams through gyn.     Past Medical History:  Diagnosis Date  . GERD (gastroesophageal reflux disease)   . Hyperglycemia   . Hyperplastic colon polyp   . Skin cancer   . Vitamin D deficiency    Past Surgical History:  Procedure Laterality Date  . BREAST BIOPSY    . DEBRIDEMENT TENNIS ELBOW    . MOHS SURGERY     skin cancer   Family History  Problem Relation Age of Onset  . Diabetes Mother   . Heart disease Mother   . Cancer Paternal Grandfather     colon  . Cancer Paternal Aunt     Breast cancer   Social History   Social History  . Marital status: Widowed    Spouse name: N/A  . Number of children: 1  . Years of education: N/A   Social History Main Topics  . Smoking status: Never Smoker  . Smokeless tobacco: Never Used  . Alcohol use 0.0 oz/week     Comment: occassionally  . Drug use: No  . Sexual activity: Not Asked   Other Topics Concern  . None   Social History Narrative  . None    Outpatient Encounter Prescriptions as of 04/25/2016  Medication Sig  . ALPRAZolam (XANAX) 0.25 MG tablet Take 1 tablet (0.25 mg total) by mouth daily as needed for anxiety.  . Multiple Vitamins-Minerals (MULTIVITAMIN PO) Take by mouth daily.  . naproxen sodium (ANAPROX) 220 MG tablet Take 220 mg by mouth as needed. 1 to 2 capsules as needed  . ranitidine (ZANTAC) 150 MG capsule Take 150 mg by mouth daily as needed.   . [DISCONTINUED] GARCINIA CAMBOGIA-CHROMIUM PO Take 2 by mouth three times a day   No facility-administered encounter  medications on file as of 04/25/2016.     Review of Systems  Constitutional: Negative for appetite change and unexpected weight change.  HENT: Negative for congestion and sinus pressure.   Eyes: Negative for pain and visual disturbance.  Respiratory: Negative for cough, chest tightness and shortness of breath.   Cardiovascular: Negative for chest pain, palpitations and leg swelling.  Gastrointestinal: Negative for abdominal pain, diarrhea, nausea and vomiting.  Genitourinary: Negative for difficulty urinating and dysuria.  Musculoskeletal: Negative for back pain and joint swelling.  Skin: Negative for color change and rash.  Neurological: Negative for dizziness, light-headedness and headaches.  Hematological: Negative for adenopathy. Does not bruise/bleed easily.  Psychiatric/Behavioral: Negative for decreased concentration and dysphoric mood.       Objective:    Physical Exam  Constitutional: She is oriented to person, place, and time. She appears well-developed and well-nourished. No distress.  HENT:  Nose: Nose normal.  Mouth/Throat: Oropharynx is clear and moist.  Eyes: Right eye exhibits no discharge. Left eye exhibits no discharge. No scleral icterus.  Neck: Neck supple. No thyromegaly present.  Cardiovascular: Normal rate and regular rhythm.   Pulmonary/Chest: Breath sounds normal. No accessory  muscle usage. No tachypnea. No respiratory distress. She has no decreased breath sounds. She has no wheezes. She has no rhonchi. Right breast exhibits no inverted nipple, no mass, no nipple discharge and no tenderness (no axillary adenopathy). Left breast exhibits no inverted nipple, no mass, no nipple discharge and no tenderness (no axilarry adenopathy).  Abdominal: Soft. Bowel sounds are normal. There is no tenderness.  Musculoskeletal: She exhibits no edema or tenderness.  Lymphadenopathy:    She has no cervical adenopathy.  Neurological: She is alert and oriented to person, place,  and time.  Skin: Skin is warm. No rash noted. No erythema.  Psychiatric: She has a normal mood and affect. Her behavior is normal.    BP 120/78   Pulse 70   Temp 98.4 F (36.9 C) (Oral)   Ht 5' 3.5" (1.613 m)   Wt 175 lb (79.4 kg)   SpO2 96%   BMI 30.51 kg/m  Wt Readings from Last 3 Encounters:  04/25/16 175 lb (79.4 kg)  04/05/15 170 lb 8 oz (77.3 kg)  04/14/13 174 lb 12 oz (79.3 kg)     Lab Results  Component Value Date   WBC 4.5 04/22/2015   HGB 14.5 04/22/2015   HCT 42.5 04/22/2015   PLT 244.0 04/22/2015   GLUCOSE 97 04/22/2015   CHOL 208 (H) 04/22/2015   TRIG 77.0 04/22/2015   HDL 57.90 04/22/2015   LDLCALC 134 (H) 04/22/2015   ALT 22 04/22/2015   AST 23 04/22/2015   NA 140 04/22/2015   K 4.1 04/22/2015   CL 106 04/22/2015   CREATININE 0.82 04/22/2015   BUN 17 04/22/2015   CO2 23 04/22/2015   TSH 1.39 04/22/2015   HGBA1C 5.7 04/22/2015       Assessment & Plan:   Problem List Items Addressed This Visit    None    Visit Diagnoses   None.      Einar Pheasant, MD

## 2016-04-26 ENCOUNTER — Encounter: Payer: Self-pay | Admitting: Internal Medicine

## 2016-04-26 LAB — HEPATITIS C ANTIBODY: HCV Ab: NEGATIVE

## 2016-04-30 ENCOUNTER — Encounter: Payer: Self-pay | Admitting: Internal Medicine

## 2016-04-30 NOTE — Assessment & Plan Note (Signed)
Continue vitamin d supplements.  Follow vitamin D level.   

## 2016-04-30 NOTE — Assessment & Plan Note (Addendum)
Last colonoscopy 07/2011.  Due f/u colonoscopy 07/2016.   Has noticed some bleeding intermittently.  Get back in with GI for evaluation.

## 2016-04-30 NOTE — Assessment & Plan Note (Signed)
Handling stress.  Does not feel needs anything more at this time.  Follow.   

## 2016-04-30 NOTE — Assessment & Plan Note (Signed)
Low carb diet and exercise.  Follow met b and a1c.   

## 2016-05-04 ENCOUNTER — Encounter: Payer: Self-pay | Admitting: Internal Medicine

## 2016-05-20 ENCOUNTER — Encounter: Payer: Self-pay | Admitting: Internal Medicine

## 2016-05-30 ENCOUNTER — Other Ambulatory Visit: Payer: Self-pay | Admitting: Internal Medicine

## 2016-05-30 ENCOUNTER — Ambulatory Visit
Admission: RE | Admit: 2016-05-30 | Discharge: 2016-05-30 | Disposition: A | Discharge status: Home / Self Care | Payer: Managed Care, Other (non HMO) | Source: Ambulatory Visit | Attending: Internal Medicine | Admitting: Internal Medicine

## 2016-05-30 DIAGNOSIS — Z1231 Encounter for screening mammogram for malignant neoplasm of breast: Secondary | ICD-10-CM | POA: Diagnosis present

## 2016-05-30 DIAGNOSIS — Z1239 Encounter for other screening for malignant neoplasm of breast: Secondary | ICD-10-CM

## 2016-07-25 LAB — HM COLONOSCOPY

## 2016-09-10 ENCOUNTER — Encounter: Payer: Self-pay | Admitting: Internal Medicine

## 2016-09-11 NOTE — Telephone Encounter (Signed)
Called patient left message for her to call , advised her that we were closing early today and would re open at 10:00am .

## 2016-09-11 NOTE — Telephone Encounter (Signed)
Pt called and wanted to let Dr. Nicki Reaper know [facility]'d [facility] with Shingles. Pt would like to know more information about shingles. Also does Dr. Nicki Reaper want to be seen. Please advise, thank you!  Call pt @ [phone]

## 2016-09-11 NOTE — Telephone Encounter (Signed)
Please call pt and let her know that the acyclovir is the medication she should be taking for the diagnosis.  She is taking the right medication.  Shingles is a virus and has to run its course.  If she desires me to check her we can schedule an appt.  Let her know we are closing early today.

## 2016-09-11 NOTE — Telephone Encounter (Signed)
Spoke with patient states she was given Acyclovir 800 mg 1 q 5 hours and Ibuprofen.   She states she is almost out of the Ibuprofen 800 mg  can you give some more due to still having back pain.   Educated patient on shingles as well. Please advise .

## 2016-09-11 NOTE — Telephone Encounter (Signed)
Noted.  Please f/u with pt.  Thanks

## 2016-09-12 NOTE — Telephone Encounter (Signed)
Called and spoke with patient taking ibuprofen 800 mg every 8 hours , only has  # 15 left . She is getting ready to run out of medication.   She is taking ibuprofen on regular schedule for pain would you refill medication . Please advise.

## 2016-09-12 NOTE — Telephone Encounter (Signed)
Please call and notify pt that I would like to see her on 09/14/16 at 11:00 to evaluate and see what is needed.  Please call pt and notify her of appt and place on schedule.  Thanks.

## 2016-09-12 NOTE — Telephone Encounter (Signed)
Called and spoke with patient of appointment 09/14/16 @11 :00am.  Would you please place her on schedule.  Thanks

## 2016-09-14 ENCOUNTER — Encounter: Payer: Self-pay | Admitting: Internal Medicine

## 2016-09-14 ENCOUNTER — Ambulatory Visit (INDEPENDENT_AMBULATORY_CARE_PROVIDER_SITE_OTHER): Payer: Managed Care, Other (non HMO) | Admitting: Internal Medicine

## 2016-09-14 VITALS — BP 116/72 | HR 65 | Temp 98.6°F | Resp 16 | Ht 64.0 in | Wt 178.2 lb

## 2016-09-14 DIAGNOSIS — B029 Zoster without complications: Secondary | ICD-10-CM | POA: Diagnosis not present

## 2016-09-14 DIAGNOSIS — K219 Gastro-esophageal reflux disease without esophagitis: Secondary | ICD-10-CM

## 2016-09-14 MED ORDER — GABAPENTIN 100 MG PO CAPS: ORAL_CAPSULE | ORAL | 1 refills | Status: DC

## 2016-09-14 NOTE — Progress Notes (Signed)
Pre-visit discussion using our clinic review tool. No additional management support is needed unless otherwise documented below in the visit note.  

## 2016-09-14 NOTE — Progress Notes (Signed)
Patient ID: Lori Guerrero, female   DOB: 1951/06/25, 66 y.o.   MRN: [id number]   Subjective:    Patient ID: Lori Guerrero, female    DOB: [birth year], 66 y.o.   MRN: [id number]  HPI  Patient here as a work in with recent diagnosis of shingles.  She was evaluated at urgent care.  Was having some back pain.  Thought from lifting.  Then developed some itching and then noticed rash.  To urgent care.  Diagnosed with shingles.  Placed on antiviral medication.  Taking ibuprofen 800mg  tid.  She is still having some pain.  Discussed taking gabapentin for the pain - instead of ibuprofen.  Starting to have some minimal stomach issues.  No fever.  Eating.     Past Medical History:  Diagnosis Date  . GERD (gastroesophageal reflux disease)   . Hyperglycemia   . Hyperplastic colon polyp   . Skin cancer   . Vitamin D deficiency    Past Surgical History:  Procedure Laterality Date  . BREAST BIOPSY     25 years ago  . DEBRIDEMENT TENNIS ELBOW    . MOHS SURGERY     skin cancer   Family History  Problem Relation Age of Onset  . Diabetes Mother   . Heart disease Mother   . Cancer Paternal Grandfather     colon  . Cancer Paternal Aunt     Breast cancer  . Breast cancer Paternal Aunt 90   Social History   Social History  . Marital status: Widowed    Spouse name: N/A  . Number of children: 1  . Years of education: N/A   Social History Main Topics  . Smoking status: Never Smoker  . Smokeless tobacco: Never Used  . Alcohol use 0.0 oz/week     Comment: occassionally  . Drug use: No  . Sexual activity: Not Asked   Other Topics Concern  . None   Social History Narrative  . None    Outpatient Encounter Prescriptions as of 09/14/2016  Medication Sig  . ALPRAZolam (XANAX) 0.25 MG tablet Take 1 tablet (0.25 mg total) by mouth daily as needed for anxiety.  . Multiple Vitamins-Minerals (MULTIVITAMIN PO) Take by mouth daily.  . naproxen sodium (ANAPROX) 220 MG tablet Take 220 mg by mouth as  needed. 1 to 2 capsules as needed  . ranitidine (ZANTAC) 150 MG capsule Take 150 mg by mouth daily as needed.   . gabapentin (NEURONTIN) 100 MG capsule Take one capsule bid prn.   No facility-administered encounter medications on file as of 09/14/2016.     Review of Systems  Constitutional: Negative for appetite change and fever.  Gastrointestinal: Negative for diarrhea and vomiting.       Pain from back and radiating around.    Musculoskeletal: Positive for back pain. Negative for joint swelling and myalgias.  Skin: Positive for rash.  Psychiatric/Behavioral: Negative for agitation and dysphoric mood.       Objective:    Physical Exam  Constitutional: She appears well-developed and well-nourished. No distress.  Cardiovascular: Normal rate and regular rhythm.   Pulmonary/Chest: Breath sounds normal. No respiratory distress. She has no wheezes.  Abdominal: Soft. Bowel sounds are normal. There is no tenderness.  Skin:  Erythematous based ras with small vesicles that appears c/w shingles - extending from back around to abdomen.      BP 116/72 (BP Location: Left Arm, Patient Position: Sitting, Cuff Size: Normal)   Pulse 65   Temp  98.6 F (37 C) (Oral)   Resp 16   Ht 5\' 4"  (1.626 m)   Wt 178 lb 3.2 oz (80.8 kg)   SpO2 97%   BMI 30.59 kg/m  Wt Readings from Last 3 Encounters:  09/14/16 178 lb 3.2 oz (80.8 kg)  04/25/16 175 lb (79.4 kg)  04/05/15 170 lb 8 oz (77.3 kg)     Lab Results  Component Value Date   WBC 4.6 04/25/2016   HGB 14.3 04/25/2016   HCT 41.6 04/25/2016   PLT 249.0 04/25/2016   GLUCOSE 103 (H) 04/25/2016   CHOL 189 04/25/2016   TRIG 108.0 04/25/2016   HDL 54.10 04/25/2016   LDLCALC 113 (H) 04/25/2016   ALT 19 04/25/2016   AST 20 04/25/2016   NA 139 04/25/2016   K 4.1 04/25/2016   CL 108 04/25/2016   CREATININE 0.82 04/25/2016   BUN 20 04/25/2016   CO2 25 04/25/2016   TSH 1.67 04/25/2016   HGBA1C 5.7 04/25/2016       Assessment & Plan:    Problem List Items Addressed This Visit    GERD (gastroesophageal reflux disease)    Has been taking increased ibuprofen for shingles.  Stop ibuprofen.  Start zantac daily.  Follow.  Notify me if persistent problems.         Other Visit Diagnoses    Herpes zoster without complication    -  Primary   Recently diagnosed.  Treated with acyclovir.  Stop ibuprofen.  Start gabapentin. Titrate as needed.  Follow.        Einar Pheasant, MD

## 2016-09-18 ENCOUNTER — Encounter: Payer: Self-pay | Admitting: Internal Medicine

## 2016-09-18 NOTE — Assessment & Plan Note (Signed)
Has been taking increased ibuprofen for shingles.  Stop ibuprofen.  Start zantac daily.  Follow.  Notify me if persistent problems.

## 2016-10-24 ENCOUNTER — Ambulatory Visit: Payer: Managed Care, Other (non HMO) | Admitting: Internal Medicine

## 2017-03-14 ENCOUNTER — Telehealth: Payer: Self-pay | Admitting: *Deleted

## 2017-03-14 MED ORDER — ALPRAZOLAM 0.25 MG PO TABS: 0.2500 mg | ORAL_TABLET | Freq: Every day | ORAL | 0 refills | Status: DC | PRN

## 2017-03-14 NOTE — Telephone Encounter (Signed)
ok'd rx for xanax #20 with no refills.

## 2017-03-14 NOTE — Telephone Encounter (Signed)
Script faxed.
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
## 2017-04-12 LAB — HM DIABETES EYE EXAM

## 2017-04-16 ENCOUNTER — Encounter: Payer: Self-pay | Admitting: Internal Medicine

## 2017-05-01 ENCOUNTER — Other Ambulatory Visit (HOSPITAL_COMMUNITY)
Admission: RE | Admit: 2017-05-01 | Discharge: 2017-05-01 | Disposition: A | Discharge status: Home / Self Care | Payer: 59 | Source: Ambulatory Visit | Attending: Internal Medicine | Admitting: Internal Medicine

## 2017-05-01 ENCOUNTER — Ambulatory Visit (INDEPENDENT_AMBULATORY_CARE_PROVIDER_SITE_OTHER): Payer: 59 | Admitting: Internal Medicine

## 2017-05-01 ENCOUNTER — Encounter: Payer: Self-pay | Admitting: Internal Medicine

## 2017-05-01 VITALS — BP 114/78 | HR 63 | Temp 97.8°F | Resp 15 | Ht 64.17 in | Wt 175.4 lb

## 2017-05-01 DIAGNOSIS — R739 Hyperglycemia, unspecified: Secondary | ICD-10-CM

## 2017-05-01 DIAGNOSIS — Z0001 Encounter for general adult medical examination with abnormal findings: Secondary | ICD-10-CM

## 2017-05-01 DIAGNOSIS — E559 Vitamin D deficiency, unspecified: Secondary | ICD-10-CM

## 2017-05-01 DIAGNOSIS — Z124 Encounter for screening for malignant neoplasm of cervix: Secondary | ICD-10-CM

## 2017-05-01 DIAGNOSIS — Z1231 Encounter for screening mammogram for malignant neoplasm of breast: Secondary | ICD-10-CM | POA: Diagnosis not present

## 2017-05-01 DIAGNOSIS — F439 Reaction to severe stress, unspecified: Secondary | ICD-10-CM

## 2017-05-01 DIAGNOSIS — K625 Hemorrhage of anus and rectum: Secondary | ICD-10-CM | POA: Diagnosis not present

## 2017-05-01 DIAGNOSIS — Z1239 Encounter for other screening for malignant neoplasm of breast: Secondary | ICD-10-CM

## 2017-05-01 DIAGNOSIS — N898 Other specified noninflammatory disorders of vagina: Secondary | ICD-10-CM | POA: Diagnosis not present

## 2017-05-01 DIAGNOSIS — Z0000 Encounter for general adult medical examination without abnormal findings: Secondary | ICD-10-CM

## 2017-05-01 MED ORDER — NYSTATIN 100000 UNIT/GM EX CREA: 1.0000 "application " | TOPICAL_CREAM | Freq: Two times a day (BID) | CUTANEOUS | 0 refills | Status: DC

## 2017-05-01 MED ORDER — HYDROCORTISONE ACE-PRAMOXINE 1-1 % RE CREA: 1.0000 "application " | TOPICAL_CREAM | Freq: Two times a day (BID) | RECTAL | 0 refills | Status: DC

## 2017-05-01 MED ORDER — HYDROCORTISONE ACETATE 25 MG RE SUPP: 25.0000 mg | Freq: Two times a day (BID) | RECTAL | 0 refills | Status: DC

## 2017-05-01 NOTE — Progress Notes (Signed)
Patient ID: Lori Guerrero, female   DOB: 1951-05-28, 66 y.o.   MRN: [id number]   Subjective:    Patient ID: Lori Guerrero, female    DOB: 08-04-1950, 66 y.o.   MRN: [id number]  HPI  Patient here for a physical exam. Still dealing with increased stress.  Husband passed away 08/29/2013.  Discussed with her today.  Discussed counseling.  She tries to stay active.  Has been exercising.  No chest pain.  No sob.  No acid reflux.  No abdominal pain.  Bowels moving.  She does report some rectal bleeding at times.  Relates to hemorrhoids.  Just had colonoscopy 07/25/16.  Had internal hemorrhoids.  Some perivaginal irritation at times.  No vaginal discharge.     Past Medical History:  Diagnosis Date  . GERD (gastroesophageal reflux disease)   . Hyperglycemia   . Hyperplastic colon polyp   . Skin cancer   . Vitamin D deficiency    Past Surgical History:  Procedure Laterality Date  . BREAST BIOPSY     25 years ago  . DEBRIDEMENT TENNIS ELBOW    . MOHS SURGERY     skin cancer   Family History  Problem Relation Age of Onset  . Diabetes Mother   . Heart disease Mother   . Cancer Paternal Grandfather        colon  . Cancer Paternal Aunt        Breast cancer  . Breast cancer Paternal Aunt 90   Social History   Social History  . Marital status: Widowed    Spouse name: N/A  . Number of children: 1  . Years of education: N/A   Social History Main Topics  . Smoking status: Never Smoker  . Smokeless tobacco: Never Used  . Alcohol use 0.0 oz/week     Comment: occassionally  . Drug use: No  . Sexual activity: Not Asked   Other Topics Concern  . None   Social History Narrative  . None    Outpatient Encounter Prescriptions as of 05/01/2017  Medication Sig  . ALPRAZolam (XANAX) 0.25 MG tablet Take 1 tablet (0.25 mg total) by mouth daily as needed for anxiety.  . Multiple Vitamins-Minerals (MULTIVITAMIN PO) Take by mouth daily.  . naproxen sodium (ANAPROX) 220 MG tablet Take 220 mg by  mouth as needed. 1 to 2 capsules as needed  . ranitidine (ZANTAC) 150 MG capsule Take 150 mg by mouth daily as needed.   . hydrocortisone (ANUSOL-HC) 25 MG suppository Place 1 suppository (25 mg total) rectally 2 (two) times daily.  Marland Kitchen nystatin cream (MYCOSTATIN) Apply 1 application topically 2 (two) times daily.  . [DISCONTINUED] gabapentin (NEURONTIN) 100 MG capsule Take one capsule bid prn.  . [DISCONTINUED] pramoxine-hydrocortisone (ANALPRAM-HC) 1-1 % rectal cream Place 1 application rectally 2 (two) times daily.   No facility-administered encounter medications on file as of 05/01/2017.     Review of Systems  Constitutional: Negative for appetite change and unexpected weight change.  HENT: Negative for congestion and sinus pressure.   Eyes: Negative for pain and visual disturbance.  Respiratory: Negative for cough, chest tightness and shortness of breath.   Cardiovascular: Negative for chest pain, palpitations and leg swelling.  Gastrointestinal: Negative for abdominal pain, diarrhea, nausea and vomiting.  Genitourinary: Negative for difficulty urinating and dysuria.  Musculoskeletal: Negative for back pain and joint swelling.  Skin: Positive for rash. Negative for color change.  Neurological: Negative for dizziness, light-headedness and headaches.  Hematological: Negative for  adenopathy. Does not bruise/bleed easily.  Psychiatric/Behavioral: Negative for agitation and dysphoric mood.       Objective:     Blood pressure rechecked by me:  132/78  Physical Exam  Constitutional: She is oriented to person, place, and time. She appears well-developed and well-nourished. No distress.  HENT:  Nose: Nose normal.  Mouth/Throat: Oropharynx is clear and moist.  Eyes: Right eye exhibits no discharge. Left eye exhibits no discharge. No scleral icterus.  Neck: Neck supple. No thyromegaly present.  Cardiovascular: Normal rate and regular rhythm.   Pulmonary/Chest: Breath sounds normal. No  accessory muscle usage. No tachypnea. No respiratory distress. She has no decreased breath sounds. She has no wheezes. She has no rhonchi. Right breast exhibits no inverted nipple, no mass, no nipple discharge and no tenderness (no axillary adenopathy). Left breast exhibits no inverted nipple, no mass, no nipple discharge and no tenderness (no axilarry adenopathy).  Abdominal: Soft. Bowel sounds are normal. There is no tenderness.  Genitourinary:  Genitourinary Comments: Normal external genitalia.  Vaginal vault without lesions.  Cervix identified.  Pap smear performed.  Could not appreciate any adnexal masses or tenderness.  Minimal erythema - peri vaginal region.  Appears to be c/w rash.  Rectal exam - heme negative.    Musculoskeletal: She exhibits no edema or tenderness.  Lymphadenopathy:    She has no cervical adenopathy.  Neurological: She is alert and oriented to person, place, and time.  Skin: Skin is warm. No erythema.  Psychiatric: She has a normal mood and affect. Her behavior is normal.    BP 114/78 (BP Location: Left Arm, Patient Position: Sitting, Cuff Size: Large)   Pulse 63   Temp 97.8 F (36.6 C) (Oral)   Resp 15   Ht 5' 4.17" (1.63 m)   Wt 175 lb 6.4 oz (79.6 kg)   SpO2 97%   BMI 29.95 kg/m  Wt Readings from Last 3 Encounters:  05/01/17 175 lb 6.4 oz (79.6 kg)  09/14/16 178 lb 3.2 oz (80.8 kg)  04/25/16 175 lb (79.4 kg)     Lab Results  Component Value Date   WBC 4.6 04/25/2016   HGB 14.3 04/25/2016   HCT 41.6 04/25/2016   PLT 249.0 04/25/2016   GLUCOSE 103 (H) 04/25/2016   CHOL 189 04/25/2016   TRIG 108.0 04/25/2016   HDL 54.10 04/25/2016   LDLCALC 113 (H) 04/25/2016   ALT 19 04/25/2016   AST 20 04/25/2016   NA 139 04/25/2016   K 4.1 04/25/2016   CL 108 04/25/2016   CREATININE 0.82 04/25/2016   BUN 20 04/25/2016   CO2 25 04/25/2016   TSH 1.67 04/25/2016   HGBA1C 5.7 04/25/2016    Mm Screening Breast Tomo Bilateral  Result Date:  05/30/2016 CLINICAL DATA:  Screening. EXAM: 2D DIGITAL SCREENING BILATERAL MAMMOGRAM WITH CAD AND ADJUNCT TOMO COMPARISON:  Previous exam(s). ACR Breast Density Category b: There are scattered areas of fibroglandular density. FINDINGS: There are no findings suspicious for malignancy. Images were processed with CAD. IMPRESSION: No mammographic evidence of malignancy. A result letter of this screening mammogram will be mailed directly to the patient. RECOMMENDATION: Screening mammogram in one year. (Code:SM-B-01Y) BI-RADS CATEGORY  1: Negative. Electronically Signed   By: Dorise Bullion III M.D   On: 05/30/2016 17:09       Assessment & Plan:   Problem List Items Addressed This Visit    Health care maintenance    Scheduled for physical today 05/01/17.  Has been getting her  breast exams and pap smears at [facility].  Performed here today.  Colonoscopy 07/2016 with pathology revealing sessile serrated adenoma and internal hemorrhoids.  Recommended f/u colonoscopy in 3 years.   Scheduled for mammogram.  Last mammogram 05/30/16 - Birads I.        Hyperglycemia    Low carb diet and exercise.  Follow met b and a1c.        Relevant Orders   CBC with Differential/Platelet   Comprehensive metabolic panel   TSH   Hemoglobin A1c   Lipid panel   Stress    Increased stress.  Discussed counseling.  Names given.  Does not feel needs any further intervention at this time.  Follow.        Vitamin D deficiency    Follow vitamin D level.        Relevant Orders   VITAMIN D 25 Hydroxy (Vit-D Deficiency, Fractures)    Other Visit Diagnoses    Breast cancer screening    -  Primary   Relevant Orders   MM DIGITAL SCREENING BILATERAL   Cervical cancer screening       Relevant Orders   Cytology - PAP (Completed)   Rectal bleeding       Just had colonoscopy.  Internal hemorrhoids.  Annusol HC suppositories.  Follow.     Vaginal irritation       Peri vaginal rash.  Appears to be c/w yeast.  Nystatin Cream  as directed.         Einar Pheasant, MD

## 2017-05-01 NOTE — Assessment & Plan Note (Addendum)
Scheduled for physical today 05/01/17.  Has been getting her breast exams and pap smears at [facility] ([place]  Performed here today.  Colonoscopy 07/2016 with pathology revealing sessile serrated adenoma and internal hemorrhoids.  Recommended f/u colonoscopy in 3 years.   Scheduled for mammogram.  Last mammogram 05/30/16 - Birads I.

## 2017-05-02 LAB — CYTOLOGY - PAP
DIAGNOSIS: NEGATIVE
HPV (WINDOPATH): NOT DETECTED

## 2017-05-03 ENCOUNTER — Encounter: Payer: Self-pay | Admitting: Internal Medicine

## 2017-05-03 ENCOUNTER — Telehealth: Payer: Self-pay

## 2017-05-03 NOTE — Telephone Encounter (Signed)
Called patient she does not want cream. She called [facility] and they can mix up a 20mg  suppository for 42.00. I have looked at good [facility] and the lowest price  that I can find is for 64.00.

## 2017-05-03 NOTE — Telephone Encounter (Signed)
Called [facility] spoke to [facility]. Verbal was given to change patient to the 20mg  suppository #14 with no refills one bid. I have called patient and let her know about change as well.

## 2017-05-03 NOTE — Telephone Encounter (Signed)
Duplicate.  See attached.   

## 2017-05-03 NOTE — Telephone Encounter (Signed)
Ok to compound if comparable to med prescribed.

## 2017-05-03 NOTE — Telephone Encounter (Signed)
Received fax from pharmacy patient states that the cost of hydrocortisone supp was too expensive. Per pharmacy can be changed to Proctosol 2.5 % cream and it should be around $13 with insurance.

## 2017-05-03 NOTE — Telephone Encounter (Signed)
Pt would like a call back regarding the Rx pt wants to the supp not the cream. Please advise?   Call pt @ [phone] Thank you!

## 2017-05-04 ENCOUNTER — Encounter: Payer: Self-pay | Admitting: Internal Medicine

## 2017-05-04 NOTE — Assessment & Plan Note (Signed)
Increased stress.  Discussed counseling.  Names given.  Does not feel needs any further intervention at this time.  Follow.

## 2017-05-04 NOTE — Assessment & Plan Note (Signed)
Low carb diet and exercise.  Follow met b and a1c.   

## 2017-05-04 NOTE — Assessment & Plan Note (Signed)
Follow vitamin D level.  

## 2017-05-17 ENCOUNTER — Other Ambulatory Visit: Payer: 59

## 2017-05-18 ENCOUNTER — Other Ambulatory Visit (INDEPENDENT_AMBULATORY_CARE_PROVIDER_SITE_OTHER): Payer: 59

## 2017-05-18 DIAGNOSIS — R739 Hyperglycemia, unspecified: Secondary | ICD-10-CM

## 2017-05-18 DIAGNOSIS — E559 Vitamin D deficiency, unspecified: Secondary | ICD-10-CM | POA: Diagnosis not present

## 2017-05-18 LAB — COMPREHENSIVE METABOLIC PANEL
ALK PHOS: 93 U/L (ref 39–117)
ALT: 21 U/L (ref 0–35)
AST: 22 U/L (ref 0–37)
Albumin: 4 g/dL (ref 3.5–5.2)
BUN: 15 mg/dL (ref 6–23)
CO2: 25 mEq/L (ref 19–32)
Calcium: 9.4 mg/dL (ref 8.4–10.5)
Chloride: 105 mEq/L (ref 96–112)
Creatinine, Ser: 0.89 mg/dL (ref 0.40–1.20)
GFR: 67.61 mL/min (ref >60.00)
GLUCOSE: 102 mg/dL — AB (ref 70–99)
Potassium: 4.4 mEq/L (ref 3.5–5.1)
SODIUM: 139 meq/L (ref 135–145)
Total Bilirubin: 0.6 mg/dL (ref 0.2–1.2)
Total Protein: 6.5 g/dL (ref 6.0–8.3)

## 2017-05-18 LAB — LIPID PANEL
CHOL/HDL RATIO: 3
Cholesterol: 183 mg/dL (ref 0–200)
HDL: 56.3 mg/dL (ref >39.00)
LDL CALC: 110 mg/dL — AB (ref 0–99)
NonHDL: 126.22
Triglycerides: 80 mg/dL (ref 0.0–149.0)
VLDL: 16 mg/dL (ref 0.0–40.0)

## 2017-05-18 LAB — CBC WITH DIFFERENTIAL/PLATELET
BASOS ABS: 0 10*3/uL (ref 0.0–0.1)
Basophils Relative: 1.1 % (ref 0.0–3.0)
EOS PCT: 1.9 % (ref 0.0–5.0)
Eosinophils Absolute: 0.1 10*3/uL (ref 0.0–0.7)
HCT: 42.1 % (ref 36.0–46.0)
Hemoglobin: 14.2 g/dL (ref 12.0–15.0)
LYMPHS ABS: 1 10*3/uL (ref 0.7–4.0)
Lymphocytes Relative: 23.8 % (ref 12.0–46.0)
MCHC: 33.7 g/dL (ref 30.0–36.0)
MCV: 97.6 fl (ref 78.0–100.0)
MONO ABS: 0.2 10*3/uL (ref 0.1–1.0)
Monocytes Relative: 5.9 % (ref 3.0–12.0)
NEUTROS ABS: 2.8 10*3/uL (ref 1.4–7.7)
NEUTROS PCT: 67.3 % (ref 43.0–77.0)
PLATELETS: 227 10*3/uL (ref 150.0–400.0)
RBC: 4.32 Mil/uL (ref 3.87–5.11)
RDW: 12.7 % (ref 11.5–15.5)
WBC: 4.2 10*3/uL (ref 4.0–10.5)

## 2017-05-18 LAB — TSH: TSH: 1.91 u[IU]/mL (ref 0.35–4.50)

## 2017-05-18 LAB — HEMOGLOBIN A1C: HEMOGLOBIN A1C: 5.6 % (ref 4.6–6.5)

## 2017-05-18 LAB — VITAMIN D 25 HYDROXY (VIT D DEFICIENCY, FRACTURES): VITD: 40.12 ng/mL (ref 30.00–100.00)

## 2017-05-20 ENCOUNTER — Encounter: Payer: Self-pay | Admitting: Internal Medicine

## 2017-05-21 NOTE — Telephone Encounter (Signed)
See pts my chart note.  Need a copy of the colonoscopy report from Dr Dillard Essex office.  Also notify her that her cholesterol is actually slightly improved from last check and levels are ok.  Continue a low cholesterol diet and exercise.  Also, get name of her friend and info and forward info to [place].   Thanks

## 2017-05-28 NOTE — Telephone Encounter (Signed)
See my message on pt.

## 2017-06-11 ENCOUNTER — Encounter: Payer: Self-pay | Admitting: Internal Medicine

## 2017-06-15 ENCOUNTER — Ambulatory Visit
Admission: RE | Admit: 2017-06-15 | Discharge: 2017-06-15 | Disposition: A | Discharge status: Home / Self Care | Payer: 59 | Source: Ambulatory Visit | Attending: Internal Medicine | Admitting: Internal Medicine

## 2017-06-15 DIAGNOSIS — Z1231 Encounter for screening mammogram for malignant neoplasm of breast: Secondary | ICD-10-CM | POA: Diagnosis not present

## 2017-06-15 DIAGNOSIS — Z1239 Encounter for other screening for malignant neoplasm of breast: Secondary | ICD-10-CM

## 2017-08-01 ENCOUNTER — Encounter: Payer: Self-pay | Admitting: Internal Medicine

## 2017-08-01 ENCOUNTER — Ambulatory Visit (INDEPENDENT_AMBULATORY_CARE_PROVIDER_SITE_OTHER): Payer: BLUE CROSS/BLUE SHIELD | Admitting: Internal Medicine

## 2017-08-01 VITALS — BP 124/70 | HR 65 | Temp 98.3°F | Resp 16 | Wt 176.2 lb

## 2017-08-01 DIAGNOSIS — H6121 Impacted cerumen, right ear: Secondary | ICD-10-CM

## 2017-08-01 DIAGNOSIS — F439 Reaction to severe stress, unspecified: Secondary | ICD-10-CM | POA: Diagnosis not present

## 2017-08-01 DIAGNOSIS — R739 Hyperglycemia, unspecified: Secondary | ICD-10-CM

## 2017-08-01 DIAGNOSIS — R000 Tachycardia, unspecified: Secondary | ICD-10-CM | POA: Diagnosis not present

## 2017-08-01 DIAGNOSIS — K219 Gastro-esophageal reflux disease without esophagitis: Secondary | ICD-10-CM

## 2017-08-01 DIAGNOSIS — E559 Vitamin D deficiency, unspecified: Secondary | ICD-10-CM

## 2017-08-01 DIAGNOSIS — R0981 Nasal congestion: Secondary | ICD-10-CM

## 2017-08-01 MED ORDER — CARBAMIDE PEROXIDE 6.5 % OT SOLN: 5.0000 [drp] | Freq: Every day | OTIC | 0 refills | Status: DC

## 2017-08-01 NOTE — Patient Instructions (Signed)
nasacort nasal spray - 2 sprays each nostril one time per day.  Do this in the evening.    Saline nasal spray - flush nose at least 2-3x/day

## 2017-08-01 NOTE — Progress Notes (Signed)
Patient ID: Lori Guerrero, female   DOB: 03/28/1951, 67 y.o.   MRN: [id number]   Subjective:    Patient ID: Lori Guerrero, female    DOB: December 23, 1950, 68 y.o.   MRN: [id number]  HPI  Patient here for a scheduled follow up. She reports she is doing relatively well.  Overall coping with stress relatively well.  Does not feel needs anything more at this time.  No chest pain.  No sob.  No acid reflux.  No abdominal pain.  Bowels moving.  No urine change.  Does report increased drainage.  Started three days ago.  Irritated throat.  Some chest tightness associated with the above.  No chest pain or sob with increased activity or exertion.  Occasional heart racing, she relates to stress.     Past Medical History:  Diagnosis Date  . GERD (gastroesophageal reflux disease)   . Hyperglycemia   . Hyperplastic colon polyp   . Skin cancer   . Vitamin D deficiency    Past Surgical History:  Procedure Laterality Date  . BREAST BIOPSY     25 years ago  . DEBRIDEMENT TENNIS ELBOW    . MOHS SURGERY     skin cancer   Family History  Problem Relation Age of Onset  . Diabetes Mother   . Heart disease Mother   . Cancer Paternal Grandfather        colon  . Cancer Paternal Aunt        Breast cancer  . Breast cancer Paternal Aunt 15  . Breast cancer Maternal Aunt    Social History   Socioeconomic History  . Marital status: Widowed    Spouse name: None  . Number of children: 1  . Years of education: None  . Highest education level: None  Social Needs  . Financial resource strain: None  . Food insecurity - worry: None  . Food insecurity - inability: None  . Transportation needs - medical: None  . Transportation needs - non-medical: None  Occupational History  . None  Tobacco Use  . Smoking status: Never Smoker  . Smokeless tobacco: Never Used  Substance and Sexual Activity  . Alcohol use: Yes    Alcohol/week: 0.0 oz    Comment: occassionally  . Drug use: No  . Sexual activity: None    Other Topics Concern  . None  Social History Narrative  . None    Outpatient Encounter Medications as of 08/01/2017  Medication Sig  . ALPRAZolam (XANAX) 0.25 MG tablet Take 1 tablet (0.25 mg total) by mouth daily as needed for anxiety.  . hydrocortisone (ANUSOL-HC) 25 MG suppository Place 1 suppository (25 mg total) rectally 2 (two) times daily.  . Multiple Vitamins-Minerals (MULTIVITAMIN PO) Take by mouth daily.  . naproxen sodium (ANAPROX) 220 MG tablet Take 220 mg by mouth as needed. 1 to 2 capsules as needed  . nystatin cream (MYCOSTATIN) Apply 1 application topically 2 (two) times daily.  . ranitidine (ZANTAC) 150 MG capsule Take 150 mg by mouth daily as needed.   . carbamide peroxide (DEBROX) 6.5 % OTIC solution Place 5 drops into the right ear daily. Massage for approximately 5 minutes   No facility-administered encounter medications on file as of 08/01/2017.     Review of Systems  Constitutional: Negative for appetite change and unexpected weight change.  HENT: Positive for congestion and postnasal drip.        Irritated throat.    Respiratory: Positive for chest tightness. Negative  for cough and shortness of breath.   Cardiovascular: Negative for chest pain, palpitations and leg swelling.  Gastrointestinal: Negative for abdominal pain, diarrhea, nausea and vomiting.  Genitourinary: Negative for difficulty urinating and dysuria.  Musculoskeletal: Negative for joint swelling and myalgias.  Skin: Negative for color change and rash.  Neurological: Negative for dizziness, light-headedness and headaches.  Psychiatric/Behavioral: Negative for agitation and dysphoric mood.       Objective:    Physical Exam  Constitutional: She appears well-developed and well-nourished. No distress.  HENT:  Nose: Nose normal.  Mouth/Throat: Oropharynx is clear and moist.  Right cerumen impaction.   Neck: Neck supple. No thyromegaly present.  Cardiovascular: Normal rate and regular rhythm.   Pulmonary/Chest: Breath sounds normal. No respiratory distress. She has no wheezes.  Abdominal: Soft. Bowel sounds are normal. There is no tenderness.  Musculoskeletal: She exhibits no edema or tenderness.  Lymphadenopathy:    She has no cervical adenopathy.  Skin: No rash noted. No erythema.  Psychiatric: She has a normal mood and affect. Her behavior is normal.    BP 124/70 (BP Location: Right Arm, Patient Position: Sitting, Cuff Size: Large)   Pulse 65   Temp 98.3 F (36.8 C) (Oral)   Resp 16   Wt 176 lb 3.2 oz (79.9 kg)   SpO2 95%   BMI 30.08 kg/m  Wt Readings from Last 3 Encounters:  08/01/17 176 lb 3.2 oz (79.9 kg)  05/01/17 175 lb 6.4 oz (79.6 kg)  09/14/16 178 lb 3.2 oz (80.8 kg)     Lab Results  Component Value Date   WBC 4.2 05/18/2017   HGB 14.2 05/18/2017   HCT 42.1 05/18/2017   PLT 227.0 05/18/2017   GLUCOSE 102 (H) 05/18/2017   CHOL 183 05/18/2017   TRIG 80.0 05/18/2017   HDL 56.30 05/18/2017   LDLCALC 110 (H) 05/18/2017   ALT 21 05/18/2017   AST 22 05/18/2017   NA 139 05/18/2017   K 4.4 05/18/2017   CL 105 05/18/2017   CREATININE 0.89 05/18/2017   BUN 15 05/18/2017   CO2 25 05/18/2017   TSH 1.91 05/18/2017   HGBA1C 5.6 05/18/2017    Mm Screening Breast Tomo Bilateral  Result Date: 06/15/2017 CLINICAL DATA:  Screening. EXAM: 2D DIGITAL SCREENING BILATERAL MAMMOGRAM WITH CAD AND ADJUNCT TOMO COMPARISON:  Previous exam(s). ACR Breast Density Category b: There are scattered areas of fibroglandular density. FINDINGS: There are no findings suspicious for malignancy. Images were processed with CAD. IMPRESSION: No mammographic evidence of malignancy. A result letter of this screening mammogram will be mailed directly to the patient. RECOMMENDATION: Screening mammogram in one year. (Code:SM-B-01Y) BI-RADS CATEGORY  1: Negative. Electronically Signed   By: Marin Olp M.D.   On: 06/15/2017 09:46       Assessment & Plan:   Problem List Items Addressed  This Visit    Cerumen impaction    Cerumen impaction - right ear.  Debrox.  Return for ear irrigation.       GERD (gastroesophageal reflux disease)    No upper symptoms reported.  Follow.       Hyperglycemia    Low carb diet and exercise.  Follow met b and a1c.       Nasal congestion    Increased congestion and throat irritation.  nasacort nasal spray and saline nasal spray as outlined.  Follow.  Notify me if symptoms progress.        Stress    Increased stress as outlined.  Discussed  with her today.  Does not feel needs any further intervention.  Follow.        Vitamin D deficiency    Follow vitamin D level.        Other Visit Diagnoses    Racing heart beat    -  Primary   Notices when stressed per pt.  Some chest tightness with increased congestion recently.  Discussed further w/up.  She declines.  Declines EKG.         Einar Pheasant, MD

## 2017-08-04 ENCOUNTER — Encounter: Payer: Self-pay | Admitting: Internal Medicine

## 2017-08-04 DIAGNOSIS — R0981 Nasal congestion: Secondary | ICD-10-CM | POA: Insufficient documentation

## 2017-08-04 DIAGNOSIS — H6120 Impacted cerumen, unspecified ear: Secondary | ICD-10-CM | POA: Insufficient documentation

## 2017-08-04 NOTE — Assessment & Plan Note (Signed)
Increased congestion and throat irritation.  nasacort nasal spray and saline nasal spray as outlined.  Follow.  Notify me if symptoms progress.

## 2017-08-04 NOTE — Assessment & Plan Note (Signed)
Low carb diet and exercise.  Follow met b and a1c.  

## 2017-08-04 NOTE — Assessment & Plan Note (Signed)
Cerumen impaction - right ear.  Debrox.  Return for ear irrigation.

## 2017-08-04 NOTE — Assessment & Plan Note (Signed)
No upper symptoms reported.  Follow.   

## 2017-08-04 NOTE — Assessment & Plan Note (Signed)
Follow vitamin D level.  

## 2017-08-04 NOTE — Assessment & Plan Note (Signed)
Increased stress as outlined.  Discussed with her today.  Does not feel needs any further intervention.  Follow.   

## 2017-08-06 ENCOUNTER — Encounter: Payer: Self-pay | Admitting: Podiatry

## 2017-08-06 ENCOUNTER — Ambulatory Visit (INDEPENDENT_AMBULATORY_CARE_PROVIDER_SITE_OTHER): Payer: BLUE CROSS/BLUE SHIELD | Admitting: Podiatry

## 2017-08-06 VITALS — BP 120/62 | HR 71 | Temp 98.1°F | Resp 16

## 2017-08-06 DIAGNOSIS — M2041 Other hammer toe(s) (acquired), right foot: Secondary | ICD-10-CM

## 2017-08-06 DIAGNOSIS — Q828 Other specified congenital malformations of skin: Secondary | ICD-10-CM | POA: Diagnosis not present

## 2017-08-06 NOTE — Progress Notes (Signed)
Subjective:  Patient ID: Lori Guerrero, female    DOB: 1951/03/22,  MRN: [id number] HPI Chief Complaint  Patient presents with  . Callouses    Patient presents today for painful corn on medial side of rt 5th toe x 2-3  months.  She states its feels like something is sticking in her toe.  She has been wearing wider shoes to help with discomfort and soaking in Epson salt with some relief.  she reports it feels a little better than its been    67 y.o. female presents with the above complaint.     Past Medical History:  Diagnosis Date  . GERD (gastroesophageal reflux disease)   . Hyperglycemia   . Hyperplastic colon polyp   . Skin cancer   . Vitamin D deficiency    Past Surgical History:  Procedure Laterality Date  . BREAST BIOPSY     25 years ago  . DEBRIDEMENT TENNIS ELBOW    . MOHS SURGERY     skin cancer    Current Outpatient Medications:  .  ALPRAZolam (XANAX) 0.25 MG tablet, Take 1 tablet (0.25 mg total) by mouth daily as needed for anxiety., Disp: 20 tablet, Rfl: 0 .  carbamide peroxide (DEBROX) 6.5 % OTIC solution, Place 5 drops into the right ear daily. Massage for approximately 5 minutes, Disp: 15 mL, Rfl: 0 .  hydrocortisone (ANUSOL-HC) 25 MG suppository, Place 1 suppository (25 mg total) rectally 2 (two) times daily., Disp: 14 suppository, Rfl: 0 .  Multiple Vitamins-Minerals (MULTIVITAMIN PO), Take by mouth daily., Disp: , Rfl:  .  naproxen sodium (ANAPROX) 220 MG tablet, Take 220 mg by mouth as needed. 1 to 2 capsules as needed, Disp: , Rfl:  .  nystatin cream (MYCOSTATIN), Apply 1 application topically 2 (two) times daily., Disp: 30 g, Rfl: 0 .  ranitidine (ZANTAC) 150 MG capsule, Take 150 mg by mouth daily as needed. , Disp: , Rfl:   Allergies  Allergen Reactions  . Septra [Sulfamethoxazole-Trimethoprim]    Review of Systems Objective:   Vitals:   08/06/17 0834  BP: 120/62  Pulse: 71  Resp: 16  Temp: 98.1 F (36.7 C)    General: Well developed,  nourished, in no acute distress, alert and oriented x3   Dermatological: Skin is warm, dry and supple bilateral. Nails x 10 are well maintained; remaining integument appears unremarkable at this time. There are no open sores, no preulcerative lesions, no rash or signs of infection present.  Vascular: Dorsalis Pedis artery and Posterior Tibial artery pedal pulses are 2/4 bilateral with immedate capillary fill time. Pedal hair growth present. No varicosities and no lower extremity edema present bilateral.   Neruologic: Grossly intact via light touch bilateral. Vibratory intact via tuning fork bilateral. Protective threshold with Semmes Wienstein monofilament intact to all pedal sites bilateral. Patellar and Achilles deep tendon reflexes 2+ bilateral. No Babinski or clonus noted bilateral.   Musculoskeletal: No gross boney pedal deformities bilateral. No pain, crepitus, or limitation noted with foot and ankle range of motion bilateral. Muscular strength 5/5 in all groups tested bilateral.  Gait: Unassisted, Nonantalgic.    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   Plan: Debrided reactive hyperkeratosis placed padding.  Discussed the need for surgical intervention consisting of a hammertoe repair fourth digit right foot with exostectomy to the fifth digit and hammertoe repair fifth  digit.  We did talk about a screw to the fourth digit she will follow-up with me in the near future if she feels that this is necessary surgery.     Max T. [place], [place]

## 2017-08-07 ENCOUNTER — Ambulatory Visit (INDEPENDENT_AMBULATORY_CARE_PROVIDER_SITE_OTHER): Payer: BLUE CROSS/BLUE SHIELD | Admitting: *Deleted

## 2017-08-07 DIAGNOSIS — H6121 Impacted cerumen, right ear: Secondary | ICD-10-CM | POA: Diagnosis not present

## 2017-08-07 NOTE — Progress Notes (Addendum)
Per last OV patient has cerumen impaction of right used debrox nightly. Irrigated patient  ear for 5 minutes warm water small amount of cerumen removed . Patient stated she had used bobby pen to remove some wax advised patient not to put foreign objects in ear.  Reviewed.  Dr Nicki Reaper

## 2017-11-12 ENCOUNTER — Ambulatory Visit: Payer: Self-pay | Admitting: *Deleted

## 2017-11-12 NOTE — Telephone Encounter (Signed)
Pt states that on yesterday her family noted that she had a tick on her left upper shoulder. Pt does not now how long the tick had been attached but is was removed around lunch time on yesterday by her family members. Pt states that [place] and so does her mother. Pt states she is unable to give a description of how the tick looked because it was destroyed but the pt states that her family did note that it was flat. Pt states that her family had difficulty removing the tick and even used a lit match to the area.  Pt states the head and body of the tick was removed but it did take a while. Pt states she is not having any other symptoms at this time. Pt reports that she has a nickel sized area on her left shoulder that is red and swollen. Pt given home care advice and explained to pt that Dr. Nicki Reaper would be notified. Advised pt to call the office back if site starts to look infected or other symptoms develop. Pt verbalized understanding.  Reason for Disposition . Tick bite with no complications  Answer Assessment - Initial Assessment Questions 1. TYPE of TICK: "Is it a wood tick or a deer tick?" If unsure, ask: "What size was the tick?" "Did it look more like a watermelon seed or a poppy seed?"  Tick looked flat, pt states that it was destroyed so unable to give a description 2. LOCATION: "Where is the tick bite located?"      Left upper shoulder 3. ONSET: "How long do you think the tick was attached before you removed it?" (Hours or days)     Unsure how long tick was attached but it was found on yesteraday  Approximately around  lunch time  4. TETANUS: "When was the last tetanus booster?"      2015 5. PREGNANCY: "Is there any chance you are pregnant?" "When was your last menstrual period?"     n/a  Protocols used: TICK BITE-A-AH

## 2017-11-12 NOTE — Telephone Encounter (Signed)
Monitor for any fever, rash or joint aches.  If area is of concern or feels needs to be evaluated, let us know.

## 2017-11-12 NOTE — Telephone Encounter (Signed)
Pt notified of Dr. Bary Leriche note on 5/13. Pt verbalized understanding.

## 2017-11-12 NOTE — Telephone Encounter (Signed)
FYI

## 2017-11-12 NOTE — Telephone Encounter (Signed)
Please advise 

## 2017-12-04 ENCOUNTER — Ambulatory Visit: Payer: BLUE CROSS/BLUE SHIELD | Admitting: Internal Medicine

## 2018-01-24 ENCOUNTER — Ambulatory Visit (INDEPENDENT_AMBULATORY_CARE_PROVIDER_SITE_OTHER): Payer: BLUE CROSS/BLUE SHIELD | Admitting: Family Medicine

## 2018-01-24 ENCOUNTER — Encounter: Payer: Self-pay | Admitting: Family Medicine

## 2018-01-24 ENCOUNTER — Ambulatory Visit: Payer: Self-pay | Admitting: *Deleted

## 2018-01-24 VITALS — BP 114/60 | HR 79 | Temp 98.5°F | Wt 178.4 lb

## 2018-01-24 DIAGNOSIS — K219 Gastro-esophageal reflux disease without esophagitis: Secondary | ICD-10-CM

## 2018-01-24 DIAGNOSIS — R1084 Generalized abdominal pain: Secondary | ICD-10-CM | POA: Diagnosis not present

## 2018-01-24 DIAGNOSIS — N950 Postmenopausal bleeding: Secondary | ICD-10-CM

## 2018-01-24 MED ORDER — OMEPRAZOLE 20 MG PO CPDR: 20.0000 mg | DELAYED_RELEASE_CAPSULE | Freq: Every day | ORAL | 1 refills | Status: DC

## 2018-01-24 NOTE — Telephone Encounter (Signed)
Patient is calling to discuss the abdominal pain she has been having since 7/12. It started as a full feeling and has turned into lower abdominal pain that comes and goes. Patient reports bloating and she has had 1 episode of uterine spotting- 7/4. Patient does not relate the pain to eating or her bowels- but she feels it could be related to stress.  Reason for Disposition . Age > 60 years  Answer Assessment - Initial Assessment Questions 1. LOCATION: "Where does it hurt?"      Lower abdominal pain 2. RADIATION: "Does the pain shoot anywhere else?" (e.g., chest, back)     Sometimes into back and under R breast 3. ONSET: "When did the pain begin?" (e.g., minutes, hours or days ago)      7/12- patient started feeling full- 7/4 patient had blood show up-  4. SUDDEN: "Gradual or sudden onset?"     Gradual- comes and goes- patient thinks could be stress related 5. PATTERN "Does the pain come and go, or is it constant?"    - If constant: "Is it getting better, staying the same, or worsening?"      (Note: Constant means the pain never goes away completely; most serious pain is constant and it progresses)     - If intermittent: "How long does it last?" "Do you have pain now?"     (Note: Intermittent means the pain goes away completely between bouts)     Comes and goes- does linger for long periods of time- patient states it's almost like nerve pain 6. SEVERITY: "How bad is the pain?"  (e.g., Scale 1-10; mild, moderate, or severe)   - MILD (1-3): doesn't interfere with normal activities, abdomen soft and not tender to touch    - MODERATE (4-7): interferes with normal activities or awakens from sleep, tender to touch    - SEVERE (8-10): excruciating pain, doubled over, unable to do any normal activities      Aches and hurts-7-8 7. RECURRENT SYMPTOM: "Have you ever had this type of abdominal pain before?" If so, ask: "When was the last time?" and "What happened that time?"      no 8. CAUSE: "What do  you think is causing the abdominal pain?"     Stress relates- possible ulcer, liver problems 9. RELIEVING/AGGRAVATING FACTORS: "What makes it better or worse?" (e.g., movement, antacids, bowel movement)     Comes and goes- nothing makes it better 10. OTHER SYMPTOMS: "Has there been any vomiting, diarrhea, constipation, or urine problems?"       Bloating- increase in waist size, uterine spotting- 7/4 11. PREGNANCY: "Is there any chance you are pregnant?" "When was your last menstrual period?"       n/a  Protocols used: ABDOMINAL PAIN - [facility]

## 2018-01-24 NOTE — Patient Instructions (Addendum)
Please go to the lab before you leave today. Results of your lab work will be called to you within one week or sooner if needed.  A referral to gynecology has been placed for you. You will be called within one week for an appointment. If you do not hear about your appointment, please call this office.   Omeprazole has been sent to your pharmacy for reflux. Please take this 30 minutes before a meal. Monitor symptoms and avoid triggers that can worsen reflux.  If symptoms worsen, please seek medical attention.   Abdominal Pain, Adult Many things can cause belly (abdominal) pain. Most times, belly pain is not dangerous. Many cases of belly pain can be watched and treated at home. Sometimes belly pain is serious, though. Your doctor will try to find the cause of your belly pain. Follow these instructions at home:  Take over-the-counter and prescription medicines only as told by your doctor. Do not take medicines that help you poop (laxatives) unless told to by your doctor.  Drink enough fluid to keep your pee (urine) clear or pale yellow.  Watch your belly pain for any changes.  Keep all follow-up visits as told by your doctor. This is important. Contact a doctor if:  Your belly pain changes or gets worse.  You are not hungry, or you lose weight without trying.  You are having trouble pooping (constipated) or have watery poop (diarrhea) for more than 2-3 days.  You have pain when you pee or poop.  Your belly pain wakes you up at night.  Your pain gets worse with meals, after eating, or with certain foods.  You are throwing up and cannot keep anything down.  You have a fever. Get help right away if:  Your pain does not go away as soon as your doctor says it should.  You cannot stop throwing up.  Your pain is only in areas of your belly, such as the right side or the left lower part of the belly.  You have bloody or black poop, or poop that looks like tar.  You have very bad  pain, cramping, or bloating in your belly.  You have signs of not having enough fluid or water in your body (dehydration), such as: ? Dark pee, very little pee, or no pee. ? Cracked lips. ? Dry mouth. ? Sunken eyes. ? Sleepiness. ? Weakness. This information is not intended to replace advice given to you by your health care provider. Make sure you discuss any questions you have with your health care provider. Document Released: 12/06/2007 Document Revised: 01/07/2016 Document Reviewed: 12/01/2015 Elsevier Interactive Patient Education  2018 Normal for Gastroesophageal Reflux Disease, Adult When you have gastroesophageal reflux disease (GERD), the foods you eat and your eating habits are very important. Choosing the right foods can help ease your discomfort. What guidelines do I need to follow?  Choose fruits, vegetables, whole grains, and low-fat dairy products.  Choose low-fat meat, fish, and poultry.  Limit fats such as oils, salad dressings, butter, nuts, and avocado.  Keep a food diary. This helps you identify foods that cause symptoms.  Avoid foods that cause symptoms. These may be different for everyone.  Eat small meals often instead of 3 large meals a day.  Eat your meals slowly, in a place where you are relaxed.  Limit fried foods.  Cook foods using methods other than frying.  Avoid drinking alcohol.  Avoid drinking large amounts of liquids with  your meals.  Avoid bending over or lying down until 2-3 hours after eating. What foods are not recommended? These are some foods and drinks that may make your symptoms worse: Vegetables Tomatoes. Tomato juice. Tomato and spaghetti sauce. Chili peppers. Onion and garlic. Horseradish. Fruits Oranges, grapefruit, and lemon (fruit and juice). Meats High-fat meats, fish, and poultry. This includes hot dogs, ribs, ham, sausage, salami, and bacon. Dairy Whole milk and chocolate milk. Sour cream.  Cream. Butter. Ice cream. Cream cheese. Drinks Coffee and tea. Bubbly (carbonated) drinks or energy drinks. Condiments Hot sauce. Barbecue sauce. Sweets/Desserts Chocolate and cocoa. Donuts. Peppermint and spearmint. Fats and Oils High-fat foods. This includes [place] fries and potato chips. Other Vinegar. Strong spices. This includes black pepper, white pepper, red pepper, cayenne, curry powder, cloves, ginger, and chili powder. The items listed above may not be a complete list of foods and drinks to avoid. Contact your dietitian for more information. This information is not intended to replace advice given to you by your health care provider. Make sure you discuss any questions you have with your health care provider. Document Released: 12/19/2011 Document Revised: 11/25/2015 Document Reviewed: 04/23/2013 Elsevier Interactive Patient Education  2017 [employer].

## 2018-01-24 NOTE — Progress Notes (Signed)
Subjective:    Patient ID: Lori Guerrero, female    DOB: Feb 24, 1951, 67 y.o.   MRN: [id number]  HPI  Lori Guerrero is a 67 year old female who presents today with abdominal pain that has been present for approximately one month.   Abdominal Pain: Rated as a 7 or 8 yesterday and rated as a 5 or less today.  Described as an ache when this occurs. Pain occurs intermittently  Location: She is describing generalized pain Onset/Timing: One month Duration: Varies, she cannot quantify how long but states that she notices that it is "not there" Associated Symptoms: bloating and belching can occur but not always. Aggravated: With stress and she has been experiencing increased stress at work. Alleviated by: Rest  She has been taking ibuprofen as needed for foot discomfort with certain shoes She reports drinking wine yesterday but she did not notice symptoms at that time.  Loss of appetite: No Vomiting: No Diarrhea: No Rectal bleeding: No Fever: No Weight loss: No  History of GERD, she is not taking any medication currently. She denies significant dyspepsia, hoarseness, dysphagia, or extreme weight loss, melena, or rectal bleeding.   She reports going to the beach and noticing a small drop of bright red blood in the toilet and she noticed this with wiping that she believes that this was coming from her vagina. She believes that this may have occurred twice before but she is not sure how many times over the past month. She has a history of hemorrhoids but reports that this was not related to a hemorrhoid but was noticed from her vagina. No history of trauma, no sexual activity No change in bowel/bladder function No hormone therapy No dietary supplements  No history of breast, colon, or endometrial cancer. History of hyperplastic colon polyp. She is followed every 5 years. Last colonoscopy 07/25/16 Last Pap 05/01/17 was negative for lesions or malignancy.   Review of Systems  Constitutional:  Negative for chills, fatigue and fever.  Respiratory: Negative for cough, shortness of breath and wheezing.   Cardiovascular: Negative for chest pain and palpitations.  Gastrointestinal: Positive for abdominal pain. Negative for blood in stool, constipation, diarrhea, nausea and vomiting.  Genitourinary: Positive for vaginal bleeding. Negative for dysuria, flank pain, frequency, hematuria, pelvic pain, vaginal discharge and vaginal pain.  Musculoskeletal: Negative for myalgias.  Skin: Negative for rash.  Neurological: Negative for dizziness, weakness, light-headedness and headaches.  Psychiatric/Behavioral:       Denies depressed or anxious mood. She reports increased stress      Past Medical History:  Diagnosis Date  . GERD (gastroesophageal reflux disease)   . Hyperglycemia   . Hyperplastic colon polyp   . Skin cancer   . Vitamin D deficiency      Social History   Socioeconomic History  . Marital status: Widowed    Spouse name: Not on file  . Number of children: 1  . Years of education: Not on file  . Highest education level: Not on file  Occupational History  . Not on file  Social Needs  . Financial resource strain: Not on file  . Food insecurity:    Worry: Not on file    Inability: Not on file  . Transportation needs:    Medical: Not on file    Non-medical: Not on file  Tobacco Use  . Smoking status: Never Smoker  . Smokeless tobacco: Never Used  Substance and Sexual Activity  . Alcohol use: Yes  Alcohol/week: 0.0 oz    Comment: occassionally  . Drug use: No  . Sexual activity: Not on file  Lifestyle  . Physical activity:    Days per week: Not on file    Minutes per session: Not on file  . Stress: Not on file  Relationships  . Social connections:    Talks on phone: Not on file    Gets together: Not on file    Attends religious service: Not on file    Active member of club or organization: Not on file    Attends meetings of clubs or organizations: Not  on file    Relationship status: Not on file  . Intimate partner violence:    Fear of current or ex partner: Not on file    Emotionally abused: Not on file    Physically abused: Not on file    Forced sexual activity: Not on file  Other Topics Concern  . Not on file  Social History Narrative  . Not on file    Past Surgical History:  Procedure Laterality Date  . BREAST BIOPSY     25 years ago  . DEBRIDEMENT TENNIS ELBOW    . MOHS SURGERY     skin cancer    Family History  Problem Relation Age of Onset  . Diabetes Mother   . Heart disease Mother   . Cancer Paternal Grandfather        colon  . Cancer Paternal Aunt        Breast cancer  . Breast cancer Paternal Aunt 38  . Breast cancer Maternal Aunt     Allergies  Allergen Reactions  . Septra [Sulfamethoxazole-Trimethoprim]     Current Outpatient Medications on File Prior to Visit  Medication Sig Dispense Refill  . ALPRAZolam (XANAX) 0.25 MG tablet Take 1 tablet (0.25 mg total) by mouth daily as needed for anxiety. 20 tablet 0  . carbamide peroxide (DEBROX) 6.5 % OTIC solution Place 5 drops into the right ear daily. Massage for approximately 5 minutes 15 mL 0  . hydrocortisone (ANUSOL-HC) 25 MG suppository Place 1 suppository (25 mg total) rectally 2 (two) times daily. 14 suppository 0  . Multiple Vitamins-Minerals (MULTIVITAMIN PO) Take by mouth daily.    . naproxen sodium (ANAPROX) 220 MG tablet Take 220 mg by mouth as needed. 1 to 2 capsules as needed    . nystatin cream (MYCOSTATIN) Apply 1 application topically 2 (two) times daily. 30 g 0  . ranitidine (ZANTAC) 150 MG capsule Take 150 mg by mouth daily as needed.      No current facility-administered medications on file prior to visit.     BP 114/60   Pulse 79   Temp 98.5 F (36.9 C)   Wt 178 lb 6.4 oz (80.9 kg)   SpO2 97%   BMI 30.46 kg/m     Objective:   Physical Exam  Constitutional: She is oriented to person, place, and time. She appears  well-developed and well-nourished.  HENT:  Mouth/Throat: Oropharynx is clear and moist.  Eyes: Pupils are equal, round, and reactive to light. No scleral icterus.  Neck: Neck supple.  Cardiovascular: Normal rate, regular rhythm and intact distal pulses.  Pulmonary/Chest: Effort normal and breath sounds normal. She has no wheezes. She has no rales.  Abdominal: Soft. Normal appearance and bowel sounds are normal. There is generalized tenderness. There is no rebound, no CVA tenderness, no tenderness at McBurney's point and negative Murphy's sign.  Genitourinary:  Genitourinary Comments:  Politely declined pelvic exam.   Musculoskeletal: She exhibits no edema.  Lymphadenopathy:    She has no cervical adenopathy.  Neurological: She is alert and oriented to person, place, and time.  Skin: Skin is warm and dry. Capillary refill takes less than 2 seconds. No erythema.  Psychiatric: She has a normal mood and affect. Her behavior is normal. Judgment and thought content normal.       Assessment & Plan:  1. Generalized abdominal pain Exam is reassuring. No clear etiology or trigger for symptom noted. We discussed initiating lab work with close follow up. We also discussed ultrasound of abdomen that can be obtained. She would like to have lab work first and then results will determine next step which may likely be an ultrasound. Patient will also initiate treatment for GERD and keep a diary of her symptoms with any triggers noted. Provided close verbal and written return precautions. She is also due for a follow up with her PCP and she has agreed to schedule an appointment. -CMP - Lipase  2. Postmenopausal bleeding New problem, with bloating and generalized abdominal pain, we discussed options for further evaluation including pelvic exam and referral to gynecology. She politely declined pelvic exam.  Referral placed, patient requested a female provider. Referral to [facility] with a  female provider placed. Advised her to call this office if she has not heard within one week for her appointment.  - Ambulatory referral to Gynecology  3. Gastroesophageal reflux disease, esophagitis presence not specified Intermittent symptoms occur. She has not taken medication on a regular basis recently. With recent symptom of belching and generalized tenderness, will trial omeprazole and avoidance of food triggers. We discussed triggers to avoid including caffeine, alcohol, NSAIDs, spicy food, and late night eating. Provided list of food choices for symptoms. If symptoms persist, a referral to ENT can be considered.  - omeprazole (PRILOSEC) 20 MG capsule; Take 1 capsule (20 mg total) by mouth daily.  Dispense: 30 capsule; Refill: 1  Delano Metz, FNP-C

## 2018-01-25 LAB — COMPREHENSIVE METABOLIC PANEL
ALK PHOS: 102 U/L (ref 39–117)
ALT: 22 U/L (ref 0–35)
AST: 21 U/L (ref 0–37)
Albumin: 4.2 g/dL (ref 3.5–5.2)
BUN: 21 mg/dL (ref 6–23)
CO2: 25 meq/L (ref 19–32)
CREATININE: 0.99 mg/dL (ref 0.40–1.20)
Calcium: 9.5 mg/dL (ref 8.4–10.5)
Chloride: 104 mEq/L (ref 96–112)
GFR: 59.66 mL/min — ABNORMAL LOW (ref >60.00)
GLUCOSE: 99 mg/dL (ref 70–99)
Potassium: 4.3 mEq/L (ref 3.5–5.1)
Sodium: 139 mEq/L (ref 135–145)
Total Bilirubin: 0.4 mg/dL (ref 0.2–1.2)
Total Protein: 6.9 g/dL (ref 6.0–8.3)

## 2018-01-25 LAB — LIPASE: Lipase: 48 U/L (ref 11.0–59.0)

## 2018-01-26 ENCOUNTER — Encounter: Payer: Self-pay | Admitting: Family Medicine

## 2018-01-29 NOTE — Telephone Encounter (Signed)
Patient is due for a f/u with you, how soon would you like to see patient?

## 2018-01-30 NOTE — Telephone Encounter (Signed)
Pt scheduled for labs and f/u

## 2018-01-30 NOTE — Telephone Encounter (Signed)
LMTCB

## 2018-01-30 NOTE — Telephone Encounter (Signed)
Reviewed her my chart message back to me.  Please schedule her non fasting lab appt next week.  States she is still having pain.  I cannot tell by her message if she wants me to schedule the abdominal ultrasound now or wait to see Gyn.  Please confirm doing ok and see how she wants to proceed.  Also make sure she has f/u scheduled with me after sees gyn.  Thanks

## 2018-02-05 ENCOUNTER — Telehealth: Payer: Self-pay | Admitting: Radiology

## 2018-02-05 ENCOUNTER — Other Ambulatory Visit: Payer: Self-pay | Admitting: Internal Medicine

## 2018-02-05 DIAGNOSIS — R739 Hyperglycemia, unspecified: Secondary | ICD-10-CM

## 2018-02-05 DIAGNOSIS — E559 Vitamin D deficiency, unspecified: Secondary | ICD-10-CM

## 2018-02-05 NOTE — Telephone Encounter (Signed)
Pt coming in for labs tomorrow, please place future orders. Thank you.  

## 2018-02-05 NOTE — Telephone Encounter (Signed)
Orders placed for f/u labs.  

## 2018-02-05 NOTE — Progress Notes (Signed)
Orders placed for labs

## 2018-02-06 ENCOUNTER — Other Ambulatory Visit (INDEPENDENT_AMBULATORY_CARE_PROVIDER_SITE_OTHER): Payer: BLUE CROSS/BLUE SHIELD

## 2018-02-06 ENCOUNTER — Encounter: Payer: Self-pay | Admitting: Internal Medicine

## 2018-02-06 DIAGNOSIS — R739 Hyperglycemia, unspecified: Secondary | ICD-10-CM

## 2018-02-06 DIAGNOSIS — R7989 Other specified abnormal findings of blood chemistry: Secondary | ICD-10-CM

## 2018-02-06 LAB — HEPATIC FUNCTION PANEL
ALBUMIN: 4 g/dL (ref 3.5–5.2)
ALK PHOS: 98 U/L (ref 39–117)
ALT: 19 U/L (ref 0–35)
AST: 20 U/L (ref 0–37)
Bilirubin, Direct: 0.1 mg/dL (ref 0.0–0.3)
Total Bilirubin: 0.4 mg/dL (ref 0.2–1.2)
Total Protein: 6.4 g/dL (ref 6.0–8.3)

## 2018-02-06 LAB — BASIC METABOLIC PANEL
BUN: 19 mg/dL (ref 6–23)
CHLORIDE: 107 meq/L (ref 96–112)
CO2: 27 mEq/L (ref 19–32)
Calcium: 9.2 mg/dL (ref 8.4–10.5)
Creatinine, Ser: 0.9 mg/dL (ref 0.40–1.20)
GFR: 66.59 mL/min (ref >60.00)
Glucose, Bld: 124 mg/dL — ABNORMAL HIGH (ref 70–99)
POTASSIUM: 3.8 meq/L (ref 3.5–5.1)
SODIUM: 140 meq/L (ref 135–145)

## 2018-02-06 LAB — LIPID PANEL
CHOLESTEROL: 173 mg/dL (ref 0–200)
HDL: 50 mg/dL (ref >39.00)
LDL CALC: 85 mg/dL (ref 0–99)
NonHDL: 122.72
TRIGLYCERIDES: 188 mg/dL — AB (ref 0.0–149.0)
Total CHOL/HDL Ratio: 3
VLDL: 37.6 mg/dL (ref 0.0–40.0)

## 2018-02-06 LAB — HEMOGLOBIN A1C: Hgb A1c MFr Bld: 5.8 % (ref 4.6–6.5)

## 2018-02-07 ENCOUNTER — Telehealth: Payer: Self-pay

## 2018-02-07 ENCOUNTER — Other Ambulatory Visit (INDEPENDENT_AMBULATORY_CARE_PROVIDER_SITE_OTHER): Payer: BLUE CROSS/BLUE SHIELD

## 2018-02-07 DIAGNOSIS — R7989 Other specified abnormal findings of blood chemistry: Secondary | ICD-10-CM

## 2018-02-07 LAB — URINALYSIS, ROUTINE W REFLEX MICROSCOPIC
Bilirubin Urine: NEGATIVE
Hgb urine dipstick: NEGATIVE
Ketones, ur: NEGATIVE
Nitrite: NEGATIVE
RBC / HPF: NONE SEEN (ref 0–?)
SPECIFIC GRAVITY, URINE: 1.025 (ref 1.000–1.030)
Total Protein, Urine: NEGATIVE
Urine Glucose: NEGATIVE
Urobilinogen, UA: 0.2 (ref 0.0–1.0)
pH: 5.5 (ref 5.0–8.0)

## 2018-02-07 LAB — MICROALBUMIN / CREATININE URINE RATIO
CREATININE, U: 108.5 mg/dL
Microalb Creat Ratio: 0.6 mg/g (ref 0.0–30.0)

## 2018-02-07 NOTE — Telephone Encounter (Signed)
I have ordered the urine as pt requested in her my chart message.  (see her message).  Ok to schedule her to come in for appt.  Please call pt.

## 2018-02-07 NOTE — Telephone Encounter (Signed)
Pt scheduled  

## 2018-02-07 NOTE — Telephone Encounter (Signed)
Copied from [place] [id number]. Topic: Inquiry >> Feb 07, 2018  9:22 AM Lori Guerrero wrote: Reason for CRM: pt feels she needs to give a urine sample today; pt would like lab orders created to make an appt; contact pt to advise

## 2018-02-08 ENCOUNTER — Encounter: Payer: Self-pay | Admitting: Internal Medicine

## 2018-02-11 ENCOUNTER — Ambulatory Visit (INDEPENDENT_AMBULATORY_CARE_PROVIDER_SITE_OTHER): Payer: BLUE CROSS/BLUE SHIELD

## 2018-02-11 ENCOUNTER — Encounter: Payer: Self-pay | Admitting: Obstetrics & Gynecology

## 2018-02-11 ENCOUNTER — Ambulatory Visit (INDEPENDENT_AMBULATORY_CARE_PROVIDER_SITE_OTHER): Payer: BLUE CROSS/BLUE SHIELD | Admitting: Obstetrics & Gynecology

## 2018-02-11 VITALS — BP 124/80 | Ht 63.0 in | Wt 178.0 lb

## 2018-02-11 DIAGNOSIS — N952 Postmenopausal atrophic vaginitis: Secondary | ICD-10-CM | POA: Diagnosis not present

## 2018-02-11 DIAGNOSIS — N950 Postmenopausal bleeding: Secondary | ICD-10-CM

## 2018-02-11 NOTE — Patient Instructions (Signed)
1. Postmenopausal bleeding Probably due to menopausal atrophy of the vulva with trauma from small vibrator which patient used prior to PMB in early July.  Pelvic ultrasound completely normal with a thin normal endometrial line measured at 2.1 mm.  Patient reassured.  Will observe.  Recommend gentle care of the vulva.  If has vulvar discomfort, dryness or recurrent bleeding, will schedule a visit with me to reevaluate and possibly start on an estrogen cream.  May use Replens over-the-counter for moisturizing if experiences dryness. - US Transvaginal Non-OB  2. Post-menopausal atrophic vaginitis Postmenopausal atrophic vaginitis and menopausal atrophy of the vulva.  Decision to observe at this time.  Replens over-the-counter for moisturizing as needed.  Jacqlyn Larsen, it was a pleasure meeting you today!

## 2018-02-11 NOTE — Progress Notes (Signed)
    Lori Guerrero 07/25/1950 [id number]        66 y.o.  G1P1L1   RP: Postmenopausal bleeding 12/2017  HPI: Menopausal x about 10 yrs, never took HRT.  Used a small vibrator at the vaginal intoitus to stimulate, thinking that it would keep it strong and less dry.  [place]  Went to the bathroom and saw red blood drops in the toilet and on the paper.  Had mild browning spotting a few days after that.  No PMB since then.   C/O a Rt pelvic pain intermittently since that time.  Not using the small vibrator since the episode of bleeding.    OB History  Gravida Para Term Preterm AB Living  1 1       1   SAB TAB Ectopic Multiple Live Births               # Outcome Date GA Lbr Len/2nd Weight Sex Delivery Anes PTL Lv  1 Para             Past medical history,surgical history, problem list, medications, allergies, family history and social history were all reviewed and documented in the EPIC chart.   Directed ROS with pertinent positives and negatives documented in the history of present illness/assessment and plan.  Exam:  Vitals:   02/11/18 1058  BP: 124/80  Weight: 178 lb (80.7 kg)  Height: 5\' 3"  (1.6 m)   General appearance:  Normal  Abdomen: Normal  Gynecologic exam: Vulva:  Atrophy of menopause.  Left labia minora with small fissure at base, started bleeding with touch.  Speculum:  Atrophic vaginitis.  Cervix normal, no polyp/no lesion.  Bimanual exam:  Uterus AV, normal volume, NT, mobile.  No adnexal mass, NT bilaterally.  Pelvic US today: T/V images.  Uterus anteverted homogeneous measuring 5.89 x 4.26 x 3.62 cm.  Endometrial lining is thin and normal at 2.1 mm.  Right and left ovaries normal.  No apparent mass in the right or left adnexa.  No free fluid in the posterior cul-de-sac.   Assessment/Plan:  67 y.o. G1P1   1. Postmenopausal bleeding Probably due to menopausal atrophy of the vulva with trauma from small vibrator which patient used prior to  PMB in early July.  Pelvic ultrasound completely normal with a thin normal endometrial line measured at 2.1 mm.  Patient reassured.  Will observe.  Recommend gentle care of the vulva.  If has vulvar discomfort, dryness or recurrent bleeding, will schedule a visit with me to reevaluate and possibly start on an estrogen cream.  May use Replens over-the-counter for moisturizing if experiences dryness. - US Transvaginal Non-OB  2. Post-menopausal atrophic vaginitis Postmenopausal atrophic vaginitis and menopausal atrophy of the vulva.  Decision to observe at this time.  Replens over-the-counter for moisturizing as needed.  Counseling on above issues and coordination of care more than 50% for 45 minutes.  Princess Bruins MD, 11:21 AM 02/11/2018

## 2018-02-18 ENCOUNTER — Ambulatory Visit (INDEPENDENT_AMBULATORY_CARE_PROVIDER_SITE_OTHER): Payer: BLUE CROSS/BLUE SHIELD | Admitting: Podiatry

## 2018-02-18 ENCOUNTER — Encounter: Payer: Self-pay | Admitting: Podiatry

## 2018-02-18 ENCOUNTER — Ambulatory Visit (INDEPENDENT_AMBULATORY_CARE_PROVIDER_SITE_OTHER): Payer: BLUE CROSS/BLUE SHIELD

## 2018-02-18 ENCOUNTER — Encounter

## 2018-02-18 DIAGNOSIS — M7751 Other enthesopathy of right foot: Secondary | ICD-10-CM | POA: Diagnosis not present

## 2018-02-18 DIAGNOSIS — M778 Other enthesopathies, not elsewhere classified: Secondary | ICD-10-CM

## 2018-02-18 DIAGNOSIS — M779 Enthesopathy, unspecified: Principal | ICD-10-CM

## 2018-02-18 NOTE — Progress Notes (Signed)
She presents today chief complaint of pain across the top of her foot along the lateral side.  She states that she may have injured the foot back in March but cannot recall for sure.  She states that she has sharp pains when walking and it aches a lot.  She uses a compression anklet and Tylenol.  States that she has had shots along the lateral foot years ago.  Objective: Vital signs stable alert and oriented x3.  Pulses are palpable.  Neurologic sensorium is intact all tendons are intact have full range of motion as do the joints.  She has tenderness on palpation of the sinus tarsi and on end range of motion of the subtalar joint.  No pain on medial lateral compression of calcaneus radiographs taken today do not demonstrate any type of osseous abnormalities other than what appears to be an older fracture of the cuboid bone  Assessment: Subtalar joint capsulitis cannot rule out fracture cuboid bone right foot.  Plan: After sterile Betadine skin prep injected 20 mg Kenalog 5 mg Marcaine medial aspect of the right foot.  At the sinus tarsi.  She will take her naproxen as needed.

## 2018-02-28 ENCOUNTER — Ambulatory Visit: Payer: BLUE CROSS/BLUE SHIELD | Admitting: Internal Medicine

## 2018-02-28 ENCOUNTER — Telehealth: Payer: Self-pay

## 2018-02-28 NOTE — Telephone Encounter (Signed)
Patient was seen by Lori Guerrero about a month ago for abdominal pain and she recommended ultrasound. Patient is now wanting to have the ultrasound done. She started prilosec and it was some better but sx are still not resolved. Patient is requesting that we put the order in for her to have the abdominal ultrasound done.

## 2018-02-28 NOTE — Telephone Encounter (Signed)
If still having pain, I would like to evaluate her and determine next best test and further evaluation.  Please schedule appt.

## 2018-02-28 NOTE — Telephone Encounter (Signed)
Patient states that she needed to cancel today's appointment due to a dermatological procedure she had done and she is not able to be out in the sun light or florescent lights. She also wanting to know if she can get an order for an abdominal ultra sound. Please call patient back for more information she would really like to speak with Dr. Bary Leriche nurse.

## 2018-03-01 NOTE — Telephone Encounter (Signed)
Patient is going to try to call GI and see if they will order [place]. She does not want to pay the money for an OV if she knows she needs an [place]. Also said she did not want to wait weeks to be seen. Advised patient that I would see where you could work her in and offer that appt to her.

## 2018-03-01 NOTE — Telephone Encounter (Signed)
Patient stated she would call GI and let us know what they say

## 2018-03-01 NOTE — Telephone Encounter (Signed)
If she calls GI and they order ultrasound, make sure they send me a copy.  If agrees to needs me to evaluate, ok to schedule a work in appt.

## 2018-03-06 ENCOUNTER — Other Ambulatory Visit: Payer: Self-pay | Admitting: Gastroenterology

## 2018-03-06 DIAGNOSIS — R1011 Right upper quadrant pain: Secondary | ICD-10-CM

## 2018-03-08 ENCOUNTER — Ambulatory Visit
Admission: RE | Admit: 2018-03-08 | Discharge: 2018-03-08 | Disposition: A | Discharge status: Home / Self Care | Payer: BLUE CROSS/BLUE SHIELD | Source: Ambulatory Visit | Attending: Gastroenterology | Admitting: Gastroenterology

## 2018-03-08 DIAGNOSIS — R1011 Right upper quadrant pain: Secondary | ICD-10-CM

## 2018-03-13 ENCOUNTER — Other Ambulatory Visit: Payer: Self-pay | Admitting: Family Medicine

## 2018-03-13 DIAGNOSIS — K219 Gastro-esophageal reflux disease without esophagitis: Secondary | ICD-10-CM

## 2018-04-11 ENCOUNTER — Encounter: Payer: Self-pay | Admitting: Internal Medicine

## 2018-04-11 DIAGNOSIS — R944 Abnormal results of kidney function studies: Secondary | ICD-10-CM

## 2018-04-15 ENCOUNTER — Telehealth: Payer: Self-pay | Admitting: *Deleted

## 2018-04-15 NOTE — Telephone Encounter (Signed)
Copied from [place] [id number]. Topic: General - Inquiry >> Apr 15, 2018 10:58 AM Scherrie Gerlach wrote: Reason for CRM: pt would like to know if it might be possible to come by and give a urine sample to see if her GFR has changed since last specimen? if it has improved any.  then she will know if she needs to make appointment with kidney specialist.  Thanks. Pt sent a mychart message and had not heard anything back

## 2018-04-16 ENCOUNTER — Other Ambulatory Visit: Payer: BLUE CROSS/BLUE SHIELD

## 2018-04-16 NOTE — Telephone Encounter (Signed)
See her my chart message.  I have placed the order for met b.  I sent her a my chart message explaining her kidney function was ok, but she wanted to come in for a f/u met b.  (she stated she wanted to come in for a urine check, but to check her kidney function she would need a met b). Please schedule a non fasting lab appt.  Thanks.

## 2018-04-16 NOTE — Telephone Encounter (Signed)
Per patient DPR left detailed message to call office and schedule lab appointment.

## 2018-04-17 ENCOUNTER — Telehealth: Payer: Self-pay | Admitting: *Deleted

## 2018-04-17 ENCOUNTER — Other Ambulatory Visit (INDEPENDENT_AMBULATORY_CARE_PROVIDER_SITE_OTHER): Payer: BLUE CROSS/BLUE SHIELD

## 2018-04-17 DIAGNOSIS — R944 Abnormal results of kidney function studies: Secondary | ICD-10-CM

## 2018-04-17 LAB — BASIC METABOLIC PANEL
BUN: 20 mg/dL (ref 6–23)
CHLORIDE: 108 meq/L (ref 96–112)
CO2: 24 meq/L (ref 19–32)
Calcium: 9.1 mg/dL (ref 8.4–10.5)
Creatinine, Ser: 0.91 mg/dL (ref 0.40–1.20)
GFR: 65.71 mL/min (ref >60.00)
Glucose, Bld: 106 mg/dL — ABNORMAL HIGH (ref 70–99)
Potassium: 4.3 mEq/L (ref 3.5–5.1)
Sodium: 139 mEq/L (ref 135–145)

## 2018-04-17 NOTE — Telephone Encounter (Signed)
Copied from [place] [id number]. Topic: Quick Communication - See Telephone Encounter >> Apr 17, 2018 10:18 AM Marja Kays F wrote: [facility] is calling to clarify the instruction on the omeprazole 20 mg  Best number [phone]  Ref number [id number]

## 2018-04-17 NOTE — Telephone Encounter (Signed)
Clarified prescription with [facility]. Sent in omeprazole 20mg  q day #90 with no refills per Dr. Nicki Reaper

## 2018-04-18 ENCOUNTER — Encounter: Payer: Self-pay | Admitting: Internal Medicine

## 2018-04-18 DIAGNOSIS — N182 Chronic kidney disease, stage 2 (mild): Secondary | ICD-10-CM

## 2018-04-24 NOTE — Telephone Encounter (Signed)
It appears the information has been sent. Pt sent me a my chart message.  Do you mind updating her on the  appt.  Thanks    Dr Nicki Reaper

## 2018-04-29 NOTE — Telephone Encounter (Signed)
LMTCB

## 2018-04-29 NOTE — Telephone Encounter (Signed)
Patient calling and states that she has a low gfr reading and is trying to get appointment scheduled. States that she needs to talk to someone regarding this. Would like to talk to Dr Nicki Reaper or PA. States that she is getting very frustrated with this and would like to talk to someone on the phone, not through [place]. CB#: [phone]

## 2018-04-30 NOTE — Telephone Encounter (Signed)
Copied from [place] [id number]. Topic: Quick Communication - Office Called Patient (Clinic Use ONLY) >> Apr 30, 2018 10:00 AM Lennox Solders wrote: Reason for CRM: Pt is returning [place] call

## 2018-04-30 NOTE — Telephone Encounter (Signed)
Patient did receive call from [place]. She is unsure if she wants to see them. She may want to see someone with [facility] but doesn't know yet.. Advised pt to let us know if she decides she wants to see someone else. She is going to call [facility] and see when they can get her in

## 2018-05-22 ENCOUNTER — Ambulatory Visit (INDEPENDENT_AMBULATORY_CARE_PROVIDER_SITE_OTHER): Payer: BLUE CROSS/BLUE SHIELD | Admitting: Family Medicine

## 2018-05-22 ENCOUNTER — Encounter: Payer: Self-pay | Admitting: Family Medicine

## 2018-05-22 VITALS — BP 130/62 | HR 77 | Temp 98.0°F | Ht 63.0 in | Wt 178.0 lb

## 2018-05-22 DIAGNOSIS — B0229 Other postherpetic nervous system involvement: Secondary | ICD-10-CM | POA: Diagnosis not present

## 2018-05-22 DIAGNOSIS — F439 Reaction to severe stress, unspecified: Secondary | ICD-10-CM

## 2018-05-22 DIAGNOSIS — R1011 Right upper quadrant pain: Secondary | ICD-10-CM

## 2018-05-22 LAB — CBC WITH DIFFERENTIAL/PLATELET
BASOS ABS: 0 10*3/uL (ref 0.0–0.1)
Basophils Relative: 1.2 % (ref 0.0–3.0)
EOS PCT: 2 % (ref 0.0–5.0)
Eosinophils Absolute: 0.1 10*3/uL (ref 0.0–0.7)
HCT: 42.6 % (ref 36.0–46.0)
HEMOGLOBIN: 14.7 g/dL (ref 12.0–15.0)
LYMPHS ABS: 1 10*3/uL (ref 0.7–4.0)
Lymphocytes Relative: 23.6 % (ref 12.0–46.0)
MCHC: 34.5 g/dL (ref 30.0–36.0)
MCV: 93.5 fl (ref 78.0–100.0)
Monocytes Absolute: 0.2 10*3/uL (ref 0.1–1.0)
Monocytes Relative: 5.3 % (ref 3.0–12.0)
Neutro Abs: 2.8 10*3/uL (ref 1.4–7.7)
Neutrophils Relative %: 67.9 % (ref 43.0–77.0)
Platelets: 243 10*3/uL (ref 150.0–400.0)
RBC: 4.56 Mil/uL (ref 3.87–5.11)
RDW: 13 % (ref 11.5–15.5)
WBC: 4.2 10*3/uL (ref 4.0–10.5)

## 2018-05-22 LAB — COMPREHENSIVE METABOLIC PANEL
ALK PHOS: 109 U/L (ref 39–117)
ALT: 24 U/L (ref 0–35)
AST: 20 U/L (ref 0–37)
Albumin: 4.2 g/dL (ref 3.5–5.2)
BILIRUBIN TOTAL: 0.4 mg/dL (ref 0.2–1.2)
BUN: 22 mg/dL (ref 6–23)
CO2: 24 mEq/L (ref 19–32)
CREATININE: 0.87 mg/dL (ref 0.40–1.20)
Calcium: 9.5 mg/dL (ref 8.4–10.5)
Chloride: 106 mEq/L (ref 96–112)
GFR: 69.19 mL/min (ref >60.00)
Glucose, Bld: 127 mg/dL — ABNORMAL HIGH (ref 70–99)
Potassium: 4.1 mEq/L (ref 3.5–5.1)
Sodium: 139 mEq/L (ref 135–145)
TOTAL PROTEIN: 6.4 g/dL (ref 6.0–8.3)

## 2018-05-22 LAB — LIPASE: LIPASE: 41 U/L (ref 11.0–59.0)

## 2018-05-22 LAB — AMYLASE: Amylase: 50 U/L (ref 27–131)

## 2018-05-22 MED ORDER — GABAPENTIN 100 MG PO CAPS: 300.0000 mg | ORAL_CAPSULE | Freq: Every day | ORAL | 2 refills | Status: DC

## 2018-05-22 MED ORDER — ALPRAZOLAM 0.25 MG PO TABS: 0.2500 mg | ORAL_TABLET | Freq: Every day | ORAL | 0 refills | Status: DC | PRN

## 2018-05-22 NOTE — Progress Notes (Signed)
Subjective:    Patient ID: Lori Guerrero, female    DOB: [birth year], 67 y.o.   MRN: [id number]  HPI  Presents to clinic c/o right upper ABD pain since June-July 2019.  Patient had an complete abdominal ultrasound, I reviewed the ultrasound results and it was unremarkable.  Liver, gallbladder, pancreas, spleen, kidneys all were shown to be normal on imaging.  Patient also had an upper GI endoscopy at [facility] ambulatory GI center.  Patient brought this report with her.  The endoscopy found that the esophagus was normal, a medium size hiatal hernia present, size 3 to 4 cm.  Biopsies were taken for histology, And also to test for H. Pylori -the report patient has with her does not show pathology results, but as far as she knows they were negative.  Patient also reports she did have shingles approximately a year and a half ago.  States that shingles ran along her right upper quadrant underneath breast and wrapped around to right side of the back.  Patient wonders if this pain could be lingering pain from the shingles.  Patient would also like a refill of Xanax.  She states during the holiday season she has increased stress and uses a small dose as needed.  Most recent BMPs reviewed: BMP Latest Ref Rng & Units 04/17/2018 02/06/2018 01/24/2018  Glucose 70 - 99 mg/dL 106(H) 124(H) 99  BUN 6 - 23 mg/dL 20 19 21   Creatinine 0.40 - 1.20 mg/dL 0.91 0.90 0.99  Sodium 135 - 145 mEq/L 139 140 139  Potassium 3.5 - 5.1 mEq/L 4.3 3.8 4.3  Chloride 96 - 112 mEq/L 108 107 104  CO2 19 - 32 mEq/L 24 27 25   Calcium 8.4 - 10.5 mg/dL 9.1 9.2 9.5   Hepatic function panel from August 2019 reviewed and is normal.  Patient Active Problem List   Diagnosis Date Noted  . Nasal congestion 08/04/2017  . Cerumen impaction 08/04/2017  . Stress 04/05/2015  . Health care maintenance 04/05/2015  . Lower abdominal pain 04/14/2013  . Hyperglycemia 08/13/2012  . GERD (gastroesophageal reflux disease) 08/13/2012  .  History of colonic polyps 08/13/2012  . Vitamin D deficiency 08/13/2012  . Skin cancer 08/13/2012   Social History   Tobacco Use  . Smoking status: Never Smoker  . Smokeless tobacco: Never Used  Substance Use Topics  . Alcohol use: Yes    Alcohol/week: 0.0 standard drinks    Comment: occassionally   Review of Systems   Constitutional: Negative for chills, fatigue and fever.  HENT: Negative for congestion, ear pain, sinus pain and sore throat.   Eyes: Negative.   Respiratory: Negative for cough, shortness of breath and wheezing.   Cardiovascular: Negative for chest pain, palpitations and leg swelling.  Gastrointestinal: R upper ABD pain Genitourinary: Negative for dysuria, frequency and urgency.  Musculoskeletal: Negative for arthralgias and myalgias.  Skin: Negative for color change, pallor and rash.  Neurological: Negative for syncope, light-headedness and headaches.  Psychiatric/Behavioral: The patient is not nervous/anxious.       Objective:   Physical Exam  Constitutional: She is oriented to person, place, and time.  Non-toxic appearance. She does not appear ill.  HENT:  Head: Normocephalic and atraumatic.  Eyes: EOM are normal. No scleral icterus.  Cardiovascular: Normal rate and regular rhythm.  Pulmonary/Chest: Effort normal and breath sounds normal. No respiratory distress. She has no wheezes. She has no rhonchi. She has no rales.  Abdominal: Soft. Normal appearance and bowel sounds are normal.  She exhibits no distension, no ascites and no mass. There is no splenomegaly or hepatomegaly. There is tenderness in the right upper quadrant. There is no rigidity, no rebound and no guarding. No hernia.  Neurological: She is alert and oriented to person, place, and time.  Skin: Skin is warm and dry. No pallor.  Psychiatric: She has a normal mood and affect. Her behavior is normal.  Nursing note and vitals reviewed.     Vitals:   05/22/18 0811  BP: 130/62  Pulse: 77    Temp: 98 F (36.7 C)  SpO2: 96%    Assessment & Plan:   Right upper quadrant abdominal pain-patient reassured that all of her testing results from her ultrasound, recent lab work, upper endoscopy are reassuring that all of her abdominal organs are looking normal.  We will get new lab work today including CBC, CMP, amylase and lipase due to continued pain.  Patient will continue her omeprazole as she has been taking.  Postherpetic neuralgia-it is possible the pain is related to her past shingles.  We will trial a very low dose of gabapentin at bedtime to see if this makes any difference for patient.  Patient is weary of gabapentin due to seeing how it makes her mother seem tired, but she is agreeable to take the low-dose before bed and see how it goes.  Stress-patient has increased stress and anxiety around the holiday season.  Xanax refill given.  [facility] PMP registry checked and is appropriate for this refill.  Patient uses this medication sparingly.  Keep regularly scheduled follow-up with PCP as planned.  Return to clinic sooner if any issues arise or if current symptoms persist or worsen.

## 2018-05-23 ENCOUNTER — Encounter: Payer: Self-pay | Admitting: Internal Medicine

## 2018-05-23 ENCOUNTER — Other Ambulatory Visit: Payer: Self-pay | Admitting: Internal Medicine

## 2018-05-23 DIAGNOSIS — Z1239 Encounter for other screening for malignant neoplasm of breast: Secondary | ICD-10-CM

## 2018-05-24 NOTE — Telephone Encounter (Signed)
OK to call and schedule patient for screening mammogram or would you like for her to be evaluated?

## 2018-05-27 NOTE — Telephone Encounter (Signed)
Pt scheduled and aware of appt time

## 2018-05-27 NOTE — Telephone Encounter (Signed)
I have placed order for diagnostic mammogram (with orders for ultrasound).  See her my chart message.  She has noticed right breast larger and is due screening mammogram.  Please schedule and notify pt of appt date and time.  Thanks.

## 2018-06-04 ENCOUNTER — Telehealth: Payer: Self-pay | Admitting: *Deleted

## 2018-06-04 NOTE — Telephone Encounter (Signed)
Faxed notes and labs as requested.

## 2018-06-04 NOTE — Telephone Encounter (Signed)
Copied from [place] [id number]. Topic: General - Other >> Jun 04, 2018  9:13 AM Bea Graff, NT wrote: Reason for CRM: [facility] with [facility] calling to request lab report and office notes for this pt. Pt stated she is to be seen for low kidney function. Fax#: [phone] CB#: [phone] ext: [phone]

## 2018-06-19 ENCOUNTER — Ambulatory Visit
Admission: RE | Admit: 2018-06-19 | Discharge: 2018-06-19 | Disposition: A | Discharge status: Home / Self Care | Payer: BLUE CROSS/BLUE SHIELD | Source: Ambulatory Visit | Attending: Internal Medicine | Admitting: Internal Medicine

## 2018-06-19 DIAGNOSIS — Z1239 Encounter for other screening for malignant neoplasm of breast: Secondary | ICD-10-CM

## 2018-06-22 ENCOUNTER — Other Ambulatory Visit: Payer: Self-pay | Admitting: Internal Medicine

## 2018-06-22 DIAGNOSIS — K219 Gastro-esophageal reflux disease without esophagitis: Secondary | ICD-10-CM

## 2018-09-17 ENCOUNTER — Encounter: Payer: Self-pay | Admitting: Internal Medicine

## 2018-09-19 ENCOUNTER — Ambulatory Visit: Payer: BLUE CROSS/BLUE SHIELD | Admitting: Internal Medicine

## 2018-11-12 ENCOUNTER — Encounter: Payer: Self-pay | Admitting: *Deleted

## 2018-12-04 NOTE — Telephone Encounter (Signed)
Mailed unread mychart message

## 2018-12-09 ENCOUNTER — Encounter: Payer: Self-pay | Admitting: Internal Medicine

## 2018-12-12 NOTE — Telephone Encounter (Signed)
Lm for pt to back and schedule an appt

## 2019-04-18 ENCOUNTER — Ambulatory Visit (INDEPENDENT_AMBULATORY_CARE_PROVIDER_SITE_OTHER): Payer: 59 | Admitting: Internal Medicine

## 2019-04-18 ENCOUNTER — Other Ambulatory Visit: Payer: Self-pay

## 2019-04-18 DIAGNOSIS — Z86010 Personal history of colonic polyps: Secondary | ICD-10-CM | POA: Diagnosis not present

## 2019-04-18 DIAGNOSIS — L989 Disorder of the skin and subcutaneous tissue, unspecified: Secondary | ICD-10-CM

## 2019-04-18 DIAGNOSIS — R739 Hyperglycemia, unspecified: Secondary | ICD-10-CM

## 2019-04-18 DIAGNOSIS — K219 Gastro-esophageal reflux disease without esophagitis: Secondary | ICD-10-CM | POA: Diagnosis not present

## 2019-04-18 DIAGNOSIS — F439 Reaction to severe stress, unspecified: Secondary | ICD-10-CM

## 2019-04-18 DIAGNOSIS — M79601 Pain in right arm: Secondary | ICD-10-CM

## 2019-04-18 NOTE — Progress Notes (Signed)
Patient ID: Lori Guerrero, female   DOB: 05/26/1951, 68 y.o.   MRN: [id number]   Virtual Visit via telephone Note  This visit type was conducted due to national recommendations for restrictions regarding the COVID-19 pandemic (e.g. social distancing).  This format is felt to be most appropriate for this patient at this time.  All issues noted in this document were discussed and addressed.  No physical exam was performed (except for noted visual exam findings with Video Visits).   I connected with Mehak Roskelley by a video enabled telephone and verified that I am speaking with the correct person using two identifiers. Location patient: home Location provider: work Persons participating in the virtual visit: patient, provider  I discussed the limitations, risks, security and privacy concerns of performing an evaluation and management service by telephone and the availability of in person appointments.  The patient expressed understanding and agreed to proceed.   Reason for visit: scheduled follow up.   HPI: Increased stress with covid.  Discussed with her today.  Trying to stay active.  No chest pain.  No sob.  Off omeprazole - noticed acid and sore throat.  No dysphagia.  No abdominal pain.  Bowels moving.  Working in her yard.  Scraped her leg.  Some redness related to this.  Improved now.  No fever.  Also has noticed her right breast is larger than her left.  No nodules. Some right arm discomfort.  Involved - upper arm to shoulder.  Massage therapy.  Also has noticed some skin lesions right side of her neck.  Plans to get dermatology to evaluate.  No diarrhea.     ROS: See pertinent positives and negatives per HPI.  Past Medical History:  Diagnosis Date  . GERD (gastroesophageal reflux disease)   . Hyperglycemia   . Hyperplastic colon polyp   . Skin cancer   . Vitamin D deficiency     Past Surgical History:  Procedure Laterality Date  . BREAST BIOPSY     25 years ago  . DEBRIDEMENT  TENNIS ELBOW    . MOHS SURGERY     skin cancer    Family History  Problem Relation Age of Onset  . Heart disease Mother   . Cancer Paternal Grandfather        colon  . Cancer Paternal Aunt        Breast cancer  . Breast cancer Paternal Aunt 85  . Breast cancer Maternal Aunt   . Diabetes Maternal Grandmother     SOCIAL HX: reviewed.    Current Outpatient Medications:  .  ALPRAZolam (XANAX) 0.25 MG tablet, Take 1 tablet (0.25 mg total) by mouth daily as needed for anxiety., Disp: 20 tablet, Rfl: 0 .  gabapentin (NEURONTIN) 100 MG capsule, Take 3 capsules (300 mg total) by mouth at bedtime., Disp: 30 capsule, Rfl: 2 .  Multiple Vitamins-Minerals (MULTIVITAMIN PO), Take by mouth daily., Disp: , Rfl:  .  naproxen sodium (ANAPROX) 220 MG tablet, Take 220 mg by mouth as needed. 1 to 2 capsules as needed, Disp: , Rfl:  .  nystatin cream (MYCOSTATIN), Apply 1 application topically 2 (two) times daily., Disp: 30 g, Rfl: 0 .  omeprazole (PRILOSEC) 20 MG capsule, TAKE 1 CAPSULE DAILY, Disp: 90 capsule, Rfl: 4  EXAM:  GENERAL: alert.  Sounds to be in no acute distress.  Answering questions appropriately.    PSYCH/NEURO: pleasant and cooperative, no obvious depression or anxiety, speech and thought processing grossly intact  ASSESSMENT  AND PLAN:  Discussed the following assessment and plan:  GERD (gastroesophageal reflux disease) Omeprazole controls.  Follow.    History of colonic polyps Colonoscopy 07/2016 - recommended f/u in 3 years.    Hyperglycemia Low carb diet and exercise.  Follow met b and a1c.   Stress Increased stress.  Discussed with her today.  Overall she feels she is handling things relatively well.  Does not feel needs any further intervention.  Follow.    Right arm pain Discomfort as outlined.  Massage therapy - helps.  Desires no further evaluation at this time.  Follow.    Skin lesions Skin lesions right side neck:  Has appt with dermatology.      I  discussed the assessment and treatment plan with the patient. The patient was provided an opportunity to ask questions and all were answered. The patient agreed with the plan and demonstrated an understanding of the instructions.   The patient was advised to call back or seek an in-person evaluation if the symptoms worsen or if the condition fails to improve as anticipated.  I provided 30 minutes of non-face-to-face time during this encounter.   Einar Pheasant, MD

## 2019-04-23 ENCOUNTER — Encounter: Payer: Self-pay | Admitting: Internal Medicine

## 2019-04-23 DIAGNOSIS — L989 Disorder of the skin and subcutaneous tissue, unspecified: Secondary | ICD-10-CM | POA: Insufficient documentation

## 2019-04-23 DIAGNOSIS — M79601 Pain in right arm: Secondary | ICD-10-CM | POA: Insufficient documentation

## 2019-04-23 NOTE — Assessment & Plan Note (Signed)
Low carb diet and exercise.  Follow met b and a1c.  

## 2019-04-23 NOTE — Assessment & Plan Note (Signed)
Skin lesions right side neck:  Has appt with dermatology.

## 2019-04-23 NOTE — Assessment & Plan Note (Signed)
Increased stress.  Discussed with her today.  Overall she feels she is handling things relatively well.  Does not feel needs any further intervention.  Follow.

## 2019-04-23 NOTE — Assessment & Plan Note (Signed)
Colonoscopy 07/2016 - recommended f/u in 3 years.

## 2019-04-23 NOTE — Assessment & Plan Note (Signed)
Omeprazole controls.  Follow.

## 2019-04-23 NOTE — Assessment & Plan Note (Signed)
Discomfort as outlined.  Massage therapy - helps.  Desires no further evaluation at this time.  Follow.

## 2019-05-07 ENCOUNTER — Encounter: Payer: Self-pay | Admitting: Internal Medicine

## 2019-05-08 ENCOUNTER — Other Ambulatory Visit (INDEPENDENT_AMBULATORY_CARE_PROVIDER_SITE_OTHER): Payer: 59

## 2019-05-08 ENCOUNTER — Other Ambulatory Visit: Payer: Self-pay

## 2019-05-08 DIAGNOSIS — R739 Hyperglycemia, unspecified: Secondary | ICD-10-CM | POA: Diagnosis not present

## 2019-05-08 LAB — LIPID PANEL
Cholesterol: 192 mg/dL (ref 0–200)
HDL: 51.1 mg/dL (ref >39.00)
LDL Cholesterol: 125 mg/dL — ABNORMAL HIGH (ref 0–99)
NonHDL: 141.12
Total CHOL/HDL Ratio: 4
Triglycerides: 79 mg/dL (ref 0.0–149.0)
VLDL: 15.8 mg/dL (ref 0.0–40.0)

## 2019-05-08 LAB — CBC WITH DIFFERENTIAL/PLATELET
Basophils Absolute: 0 10*3/uL (ref 0.0–0.1)
Basophils Relative: 1.1 % (ref 0.0–3.0)
Eosinophils Absolute: 0.1 10*3/uL (ref 0.0–0.7)
Eosinophils Relative: 2.1 % (ref 0.0–5.0)
HCT: 41.3 % (ref 36.0–46.0)
Hemoglobin: 14 g/dL (ref 12.0–15.0)
Lymphocytes Relative: 23.7 % (ref 12.0–46.0)
Lymphs Abs: 0.8 10*3/uL (ref 0.7–4.0)
MCHC: 34 g/dL (ref 30.0–36.0)
MCV: 93.8 fl (ref 78.0–100.0)
Monocytes Absolute: 0.2 10*3/uL (ref 0.1–1.0)
Monocytes Relative: 6.5 % (ref 3.0–12.0)
Neutro Abs: 2.2 10*3/uL (ref 1.4–7.7)
Neutrophils Relative %: 66.6 % (ref 43.0–77.0)
Platelets: 222 10*3/uL (ref 150.0–400.0)
RBC: 4.4 Mil/uL (ref 3.87–5.11)
RDW: 13.2 % (ref 11.5–15.5)
WBC: 3.3 10*3/uL — ABNORMAL LOW (ref 4.0–10.5)

## 2019-05-08 LAB — COMPREHENSIVE METABOLIC PANEL
ALT: 16 U/L (ref 0–35)
AST: 19 U/L (ref 0–37)
Albumin: 4.1 g/dL (ref 3.5–5.2)
Alkaline Phosphatase: 92 U/L (ref 39–117)
BUN: 19 mg/dL (ref 6–23)
CO2: 23 mEq/L (ref 19–32)
Calcium: 9.1 mg/dL (ref 8.4–10.5)
Chloride: 107 mEq/L (ref 96–112)
Creatinine, Ser: 0.86 mg/dL (ref 0.40–1.20)
GFR: 65.78 mL/min (ref >60.00)
Glucose, Bld: 110 mg/dL — ABNORMAL HIGH (ref 70–99)
Potassium: 4.1 mEq/L (ref 3.5–5.1)
Sodium: 140 mEq/L (ref 135–145)
Total Bilirubin: 0.6 mg/dL (ref 0.2–1.2)
Total Protein: 6.1 g/dL (ref 6.0–8.3)

## 2019-05-08 LAB — HEMOGLOBIN A1C: Hgb A1c MFr Bld: 5.8 % (ref 4.6–6.5)

## 2019-05-08 LAB — TSH: TSH: 1.84 u[IU]/mL (ref 0.35–4.50)

## 2019-05-09 ENCOUNTER — Ambulatory Visit (INDEPENDENT_AMBULATORY_CARE_PROVIDER_SITE_OTHER): Payer: 59 | Admitting: Internal Medicine

## 2019-05-09 VITALS — BP 126/78 | HR 62 | Temp 96.8°F | Resp 16 | Ht 63.0 in | Wt 176.4 lb

## 2019-05-09 DIAGNOSIS — E2839 Other primary ovarian failure: Secondary | ICD-10-CM | POA: Diagnosis not present

## 2019-05-09 DIAGNOSIS — Z0000 Encounter for general adult medical examination without abnormal findings: Secondary | ICD-10-CM

## 2019-05-09 DIAGNOSIS — N6489 Other specified disorders of breast: Secondary | ICD-10-CM | POA: Diagnosis not present

## 2019-05-09 DIAGNOSIS — R739 Hyperglycemia, unspecified: Secondary | ICD-10-CM

## 2019-05-09 DIAGNOSIS — E559 Vitamin D deficiency, unspecified: Secondary | ICD-10-CM

## 2019-05-09 DIAGNOSIS — K219 Gastro-esophageal reflux disease without esophagitis: Secondary | ICD-10-CM

## 2019-05-09 DIAGNOSIS — Z1231 Encounter for screening mammogram for malignant neoplasm of breast: Secondary | ICD-10-CM

## 2019-05-09 DIAGNOSIS — F439 Reaction to severe stress, unspecified: Secondary | ICD-10-CM

## 2019-05-09 DIAGNOSIS — M79601 Pain in right arm: Secondary | ICD-10-CM

## 2019-05-09 NOTE — Progress Notes (Signed)
Patient ID: Lori Guerrero, female   DOB: 1951/05/23, 68 y.o.   MRN: [id number]   Subjective:    Patient ID: Lori Guerrero, female    DOB: 23-Mar-1951, 68 y.o.   MRN: [id number]  HPI  Patient here for her physical exam.  She reports she is doing relatively well.  has noticed her right breast is larger than left.  No distinct nodules. No nipple discharge reported.  Also noted some pain in her right arm and into her back.  Some discomfort with reaching posteriorly.  No known injury.   No chest pain. No sob.  No acid reflux.  No abdominal pain.  Bowels moving.     Past Medical History:  Diagnosis Date  . GERD (gastroesophageal reflux disease)   . Hyperglycemia   . Hyperplastic colon polyp   . Skin cancer   . Vitamin D deficiency    Past Surgical History:  Procedure Laterality Date  . BREAST BIOPSY     25 years ago  . DEBRIDEMENT TENNIS ELBOW    . MOHS SURGERY     skin cancer   Family History  Problem Relation Age of Onset  . Heart disease Mother   . Cancer Paternal Grandfather        colon  . Cancer Paternal Aunt        Breast cancer  . Breast cancer Paternal Aunt 28  . Breast cancer Maternal Aunt   . Diabetes Maternal Grandmother    Social History   Socioeconomic History  . Marital status: Widowed    Spouse name: Not on file  . Number of children: 1  . Years of education: Not on file  . Highest education level: Not on file  Occupational History  . Not on file  Social Needs  . Financial resource strain: Not on file  . Food insecurity    Worry: Not on file    Inability: Not on file  . Transportation needs    Medical: Not on file    Non-medical: Not on file  Tobacco Use  . Smoking status: Never Smoker  . Smokeless tobacco: Never Used  Substance and Sexual Activity  . Alcohol use: Yes    Alcohol/week: 0.0 standard drinks    Comment: occassionally  . Drug use: No  . Sexual activity: Not Currently    Comment: 1st intercourse- 19, partners- 2, widow   Lifestyle   . Physical activity    Days per week: Not on file    Minutes per session: Not on file  . Stress: Not on file  Relationships  . Social Herbalist on phone: Not on file    Gets together: Not on file    Attends religious service: Not on file    Active member of club or organization: Not on file    Attends meetings of clubs or organizations: Not on file    Relationship status: Not on file  Other Topics Concern  . Not on file  Social History Narrative  . Not on file    Outpatient Encounter Medications as of 05/09/2019  Medication Sig  . ALPRAZolam (XANAX) 0.25 MG tablet Take 1 tablet (0.25 mg total) by mouth daily as needed for anxiety.  . gabapentin (NEURONTIN) 100 MG capsule Take 3 capsules (300 mg total) by mouth at bedtime.  . Multiple Vitamins-Minerals (MULTIVITAMIN PO) Take by mouth daily.  . naproxen sodium (ANAPROX) 220 MG tablet Take 220 mg by mouth as needed. 1 to 2  capsules as needed  . nystatin cream (MYCOSTATIN) Apply 1 application topically 2 (two) times daily.  Marland Kitchen omeprazole (PRILOSEC) 20 MG capsule TAKE 1 CAPSULE DAILY   No facility-administered encounter medications on file as of 05/09/2019.    Review of Systems  Constitutional: Negative for appetite change and unexpected weight change.  HENT: Negative for congestion and sinus pressure.   Eyes: Negative for pain and visual disturbance.  Respiratory: Negative for cough, chest tightness and shortness of breath.   Cardiovascular: Negative for chest pain, palpitations and leg swelling.  Gastrointestinal: Negative for abdominal pain, diarrhea, nausea and vomiting.  Genitourinary: Negative for difficulty urinating and dysuria.  Musculoskeletal: Negative for joint swelling and myalgias.       Right arm/back discomfort as outlined.    Skin: Negative for color change and rash.  Neurological: Negative for dizziness, light-headedness and headaches.  Hematological: Negative for adenopathy. Does not bruise/bleed  easily.  Psychiatric/Behavioral: Negative for agitation and dysphoric mood.       Objective:    Physical Exam Constitutional:      General: She is not in acute distress.    Appearance: Normal appearance.  HENT:     Head: Normocephalic and atraumatic.     Right Ear: External ear normal.     Left Ear: External ear normal.  Eyes:     General: No scleral icterus.       Right eye: No discharge.        Left eye: No discharge.     Conjunctiva/sclera: Conjunctivae normal.  Neck:     Musculoskeletal: Neck supple. No muscular tenderness.     Thyroid: No thyromegaly.  Cardiovascular:     Rate and Rhythm: Normal rate and regular rhythm.  Pulmonary:     Effort: No respiratory distress.     Breath sounds: Normal breath sounds. No wheezing.     Comments: Breasts:  Right breast larger than left.  No nipple discharge or nipple retraction present.  Could not appreciate any distinct nodules or axillary adenopathy present.   Abdominal:     General: Bowel sounds are normal.     Palpations: Abdomen is soft.     Tenderness: There is no abdominal tenderness.  Musculoskeletal:        General: No swelling or tenderness.  Lymphadenopathy:     Cervical: No cervical adenopathy.  Skin:    Findings: No erythema or rash.  Neurological:     Mental Status: She is alert.  Psychiatric:        Behavior: Behavior normal.     BP 126/78   Pulse 62   Temp (!) 96.8 F (36 C)   Resp 16   Ht 5' 3"  (1.6 m)   Wt 176 lb 6.4 oz (80 kg)   SpO2 99%   BMI 31.25 kg/m  Wt Readings from Last 3 Encounters:  05/09/19 176 lb 6.4 oz (80 kg)  05/22/18 178 lb (80.7 kg)  02/11/18 178 lb (80.7 kg)     Lab Results  Component Value Date   WBC 3.3 (L) 05/08/2019   HGB 14.0 05/08/2019   HCT 41.3 05/08/2019   PLT 222.0 05/08/2019   GLUCOSE 110 (H) 05/08/2019   CHOL 192 05/08/2019   TRIG 79.0 05/08/2019   HDL 51.10 05/08/2019   LDLCALC 125 (H) 05/08/2019   ALT 16 05/08/2019   AST 19 05/08/2019   NA 140  05/08/2019   K 4.1 05/08/2019   CL 107 05/08/2019   CREATININE 0.86 05/08/2019  BUN 19 05/08/2019   CO2 23 05/08/2019   TSH 1.84 05/08/2019   HGBA1C 5.8 05/08/2019   MICROALBUR <0.7 02/07/2018    Mm Diag Breast Tomo Bilateral  Result Date: 06/19/2018 CLINICAL DATA:  Patient presents complaining that her right breast appears larger than her left.No reported breast lumps or pain. EXAM: DIGITAL DIAGNOSTIC BILATERAL MAMMOGRAM WITH CAD AND TOMO COMPARISON:  Previous exam(s). ACR Breast Density Category b: There are scattered areas of fibroglandular density. FINDINGS: There are no discrete masses, areas of architectural distortion, areas of significant asymmetry or suspicious calcifications. No mammographic change. Mammographic images were processed with CAD. IMPRESSION: Negative exam.  No evidence of breast carcinoma. RECOMMENDATION: Screening mammogram in one year.(Code:SM-B-01Y) I have discussed the findings and recommendations with the patient. Results were also provided in writing at the conclusion of the visit. If applicable, a reminder letter will be sent to the patient regarding the next appointment. BI-RADS CATEGORY  1: Negative. Electronically Signed   By: Lajean Manes M.D.   On: 06/19/2018 10:38       Assessment & Plan:   Problem List Items Addressed This Visit    Breast asymmetry    Right breast larger than left.  Schedule diagnostic mammogram.  Follow.        Relevant Orders   MM DIAG BREAST TOMO BILATERAL   US BREAST LTD UNI LEFT INC AXILLA   US BREAST LTD UNI RIGHT INC AXILLA   GERD (gastroesophageal reflux disease)    Controlled.       Health care maintenance    Physical today 05/09/19.  Schedule mammogram as outlined.  Colonoscopy 07/2016 - sessile serrated adenoma and internal hemorrhoids.  Recommended f/u colonoscopy in 3 years.        Hyperglycemia    Low carb diet and exercise.  Follow met b and a1c.       Right arm pain    Right arm/shoulder and back pain.   Discussed further w/up and evaluation.  Discussed ortho referral.  She declines at this time.  Wants to monitor.  Follow. Notify me if changes her mind.       Stress    Increased stress.  Overall handling things relatively well.  Follow.        Vitamin D deficiency    Follow vitamin D level.         Other Visit Diagnoses    Visit for screening mammogram    -  Primary   Relevant Orders   MM 3D SCREEN BREAST BILATERAL   Estrogen deficiency       Relevant Orders   DG Bone Density   Encounter for screening mammogram for malignant neoplasm of breast       Relevant Orders   MM DIAG BREAST TOMO BILATERAL   US BREAST LTD UNI LEFT INC AXILLA   US BREAST LTD UNI RIGHT INC AXILLA       Einar Pheasant, MD

## 2019-05-12 ENCOUNTER — Encounter: Payer: Self-pay | Admitting: Internal Medicine

## 2019-05-12 NOTE — Assessment & Plan Note (Signed)
Controlled.  

## 2019-05-12 NOTE — Assessment & Plan Note (Signed)
Follow vitamin D level.  

## 2019-05-12 NOTE — Assessment & Plan Note (Signed)
Right arm/shoulder and back pain.  Discussed further w/up and evaluation.  Discussed ortho referral.  She declines at this time.  Wants to monitor.  Follow. Notify me if changes her mind.

## 2019-05-12 NOTE — Assessment & Plan Note (Signed)
Low carb diet and exercise.  Follow met b and a1c.  

## 2019-05-12 NOTE — Assessment & Plan Note (Signed)
Physical today 05/09/19.  Schedule mammogram as outlined.  Colonoscopy 07/2016 - sessile serrated adenoma and internal hemorrhoids.  Recommended f/u colonoscopy in 3 years.

## 2019-05-12 NOTE — Assessment & Plan Note (Signed)
Increased stress.  Overall handling things relatively well.  Follow.

## 2019-05-12 NOTE — Assessment & Plan Note (Signed)
Right breast larger than left.  Schedule diagnostic mammogram.  Follow.

## 2019-05-14 ENCOUNTER — Encounter: Payer: Self-pay | Admitting: Internal Medicine

## 2019-05-14 DIAGNOSIS — D72819 Decreased white blood cell count, unspecified: Secondary | ICD-10-CM

## 2019-05-15 NOTE — Telephone Encounter (Signed)
Order placed for f/u cbc.  Schedule within the next 1-2 weeks.

## 2019-05-15 NOTE — Telephone Encounter (Signed)
When does she need to be scheduled? I did not see a note about this.

## 2019-05-16 ENCOUNTER — Other Ambulatory Visit: Payer: 59

## 2019-05-16 ENCOUNTER — Other Ambulatory Visit: Payer: Self-pay

## 2019-05-16 ENCOUNTER — Encounter: Payer: Self-pay | Admitting: Internal Medicine

## 2019-05-16 NOTE — Telephone Encounter (Signed)
Pt scheduled  

## 2019-05-20 ENCOUNTER — Other Ambulatory Visit (INDEPENDENT_AMBULATORY_CARE_PROVIDER_SITE_OTHER): Payer: 59

## 2019-05-20 ENCOUNTER — Other Ambulatory Visit: Payer: Self-pay

## 2019-05-20 DIAGNOSIS — D72819 Decreased white blood cell count, unspecified: Secondary | ICD-10-CM | POA: Diagnosis not present

## 2019-05-20 LAB — CBC WITH DIFFERENTIAL/PLATELET
Basophils Absolute: 0 10*3/uL (ref 0.0–0.1)
Basophils Relative: 0.4 % (ref 0.0–3.0)
Eosinophils Absolute: 0 10*3/uL (ref 0.0–0.7)
Eosinophils Relative: 0.4 % (ref 0.0–5.0)
HCT: 41.8 % (ref 36.0–46.0)
Hemoglobin: 14.3 g/dL (ref 12.0–15.0)
Lymphocytes Relative: 11.6 % — ABNORMAL LOW (ref 12.0–46.0)
Lymphs Abs: 1.1 10*3/uL (ref 0.7–4.0)
MCHC: 34.2 g/dL (ref 30.0–36.0)
MCV: 94.8 fl (ref 78.0–100.0)
Monocytes Absolute: 0.6 10*3/uL (ref 0.1–1.0)
Monocytes Relative: 6 % (ref 3.0–12.0)
Neutro Abs: 7.9 10*3/uL — ABNORMAL HIGH (ref 1.4–7.7)
Neutrophils Relative %: 81.6 % — ABNORMAL HIGH (ref 43.0–77.0)
Platelets: 295 10*3/uL (ref 150.0–400.0)
RBC: 4.41 Mil/uL (ref 3.87–5.11)
RDW: 13.2 % (ref 11.5–15.5)
WBC: 9.7 10*3/uL (ref 4.0–10.5)

## 2019-05-21 ENCOUNTER — Encounter: Payer: Self-pay | Admitting: Internal Medicine

## 2019-05-26 ENCOUNTER — Encounter: Payer: 59 | Admitting: Internal Medicine

## 2019-06-10 ENCOUNTER — Encounter: Payer: Self-pay | Admitting: Internal Medicine

## 2019-06-10 DIAGNOSIS — F439 Reaction to severe stress, unspecified: Secondary | ICD-10-CM

## 2019-06-11 MED ORDER — ALPRAZOLAM 0.25 MG PO TABS: 0.2500 mg | ORAL_TABLET | Freq: Every day | ORAL | 0 refills | Status: DC | PRN

## 2019-06-11 NOTE — Telephone Encounter (Signed)
rx sent in for xanax #20 with no refills.

## 2019-06-23 ENCOUNTER — Ambulatory Visit
Admission: RE | Admit: 2019-06-23 | Discharge: 2019-06-23 | Disposition: A | Discharge status: Home / Self Care | Payer: 59 | Source: Ambulatory Visit | Attending: Internal Medicine | Admitting: Internal Medicine

## 2019-06-23 ENCOUNTER — Encounter: Payer: Self-pay | Admitting: Internal Medicine

## 2019-06-23 DIAGNOSIS — N6489 Other specified disorders of breast: Secondary | ICD-10-CM

## 2019-06-23 DIAGNOSIS — Z1231 Encounter for screening mammogram for malignant neoplasm of breast: Secondary | ICD-10-CM

## 2019-06-23 DIAGNOSIS — E2839 Other primary ovarian failure: Secondary | ICD-10-CM | POA: Diagnosis not present

## 2019-06-24 NOTE — Telephone Encounter (Signed)
Can schedule an appt to discuss her bone density.

## 2019-06-25 ENCOUNTER — Ambulatory Visit: Payer: 59 | Admitting: Internal Medicine

## 2019-06-26 ENCOUNTER — Encounter: Payer: Self-pay | Admitting: Internal Medicine

## 2019-06-26 ENCOUNTER — Ambulatory Visit (INDEPENDENT_AMBULATORY_CARE_PROVIDER_SITE_OTHER): Payer: 59 | Admitting: Internal Medicine

## 2019-06-26 DIAGNOSIS — K219 Gastro-esophageal reflux disease without esophagitis: Secondary | ICD-10-CM | POA: Diagnosis not present

## 2019-06-26 DIAGNOSIS — M549 Dorsalgia, unspecified: Secondary | ICD-10-CM | POA: Diagnosis not present

## 2019-06-26 NOTE — Progress Notes (Signed)
Patient ID: Lori Guerrero, female   DOB: 1950/08/21, 68 y.o.   MRN: [id number]   Virtual Visit via Note  This visit type was conducted due to national recommendations for restrictions regarding the COVID-19 pandemic (e.g. social distancing).  This format is felt to be most appropriate for this patient at this time.  All issues noted in this document were discussed and addressed.  No physical exam was performed (except for noted visual exam findings with Video Visits).   I connected with Kanon Thrall by a video enabled telemedicine application and verified that I am speaking with the correct person using two identifiers. Location patient: home Location provider: work Persons participating in the virtual visit: patient, provider  I discussed the limitations, risks, security and privacy concerns of performing an evaluation and management service by video and the availability of in person appointments. The patient expressed understanding and agreed to proceed.   Reason for visit: work in appt.  HPI: She had questions about her bone density.  Had questions and concerns regarding the report.  Discussed bone density results.  Bone density is normal.  Questions answered.  Recently was down on her knees and reached sideways.  Felt something pulling.  Has noticed some left side back discomfort.  No numbness or tingling.  Discussed stretches.  Discussed taking low dose tylenol/antiinflammatories.  No chest pain or tightness reported.  No abdominal pain reported.     ROS: See pertinent positives and negatives per HPI.  Past Medical History:  Diagnosis Date  . GERD (gastroesophageal reflux disease)   . Hyperglycemia   . Hyperplastic colon polyp   . Skin cancer   . Vitamin D deficiency     Past Surgical History:  Procedure Laterality Date  . BREAST BIOPSY     25 years ago  . DEBRIDEMENT TENNIS ELBOW    . MOHS SURGERY     skin cancer    Family History  Problem Relation Age of Onset  . Heart  disease Mother   . Cancer Paternal Grandfather        colon  . Cancer Paternal Aunt        Breast cancer  . Breast cancer Paternal Aunt 40  . Breast cancer Maternal Aunt   . Diabetes Maternal Grandmother     SOCIAL HX: reviewed.    Current Outpatient Medications:  .  ALPRAZolam (XANAX) 0.25 MG tablet, Take 1 tablet (0.25 mg total) by mouth daily as needed for anxiety., Disp: 20 tablet, Rfl: 0 .  Multiple Vitamins-Minerals (MULTIVITAMIN PO), Take by mouth daily., Disp: , Rfl:  .  nystatin cream (MYCOSTATIN), Apply 1 application topically 2 (two) times daily., Disp: 30 g, Rfl: 0 .  omeprazole (PRILOSEC) 20 MG capsule, TAKE 1 CAPSULE DAILY, Disp: 90 capsule, Rfl: 4  EXAM:  GENERAL: alert, oriented, appears well and in no acute distress  HEENT: atraumatic, conjunttiva clear, no obvious abnormalities on inspection of external nose and ears  NECK: normal movements of the head and neck  LUNGS: on inspection no signs of respiratory distress, breathing rate appears normal, no obvious gross SOB, gasping or wheezing  CV: no obvious cyanosis  PSYCH/NEURO: pleasant and cooperative, no obvious depression or anxiety, speech and thought processing grossly intact  ASSESSMENT AND PLAN:  Discussed the following assessment and plan:  Back pain Back pain as outlined.  Noticed a pulling sensation when occurred.  Continue low dose antiinflammatories and tylenol.  Stretches.  Call with update.  Notify if persistent pain.  GERD (gastroesophageal reflux disease) No upper symptoms reported.      I discussed the assessment and treatment plan with the patient. The patient was provided an opportunity to ask questions and all were answered. The patient agreed with the plan and demonstrated an understanding of the instructions.   The patient was advised to call back or seek an in-person evaluation if the symptoms worsen or if the condition fails to improve as anticipated.  I spent 27 minutes of  virtual face time with patient during this visit.  Time spent discussing bone density results.  Time also spent discussing her current symptoms and concerns.  Discussed treatment.     Einar Pheasant, MD

## 2019-06-30 ENCOUNTER — Encounter: Payer: Self-pay | Admitting: Internal Medicine

## 2019-06-30 DIAGNOSIS — M549 Dorsalgia, unspecified: Secondary | ICD-10-CM | POA: Insufficient documentation

## 2019-06-30 NOTE — Assessment & Plan Note (Signed)
Back pain as outlined.  Noticed a pulling sensation when occurred.  Continue low dose antiinflammatories and tylenol.  Stretches.  Call with update.  Notify if persistent pain.

## 2019-06-30 NOTE — Assessment & Plan Note (Signed)
No upper symptoms reported.   

## 2019-08-23 ENCOUNTER — Ambulatory Visit: Payer: 59 | Attending: Internal Medicine

## 2019-08-23 DIAGNOSIS — Z23 Encounter for immunization: Secondary | ICD-10-CM

## 2019-08-23 NOTE — Progress Notes (Signed)
   Covid-19 Vaccination Clinic  Name:  DYANNA KAMBER    MRN: [id number] DOB: 03/31/1951  08/23/2019  Ms. Tanna was observed post Covid-19 immunization for 15 minutes without incidence. She was provided with Vaccine Information Sheet and instruction to access the V-Safe system.   Ms. Cockrill was instructed to call 911 with any severe reactions post vaccine: Marland Kitchen Difficulty breathing  . Swelling of your face and throat  . A fast heartbeat  . A bad rash all over your body  . Dizziness and weakness    Immunizations Administered    Name Date Dose VIS Date Route   Pfizer COVID-19 Vaccine 08/23/2019 10:18 AM 0.3 mL 06/13/2019 Intramuscular   Manufacturer: [employer] [place]   Lot: [id number]   [place]: [id number]

## 2019-09-16 ENCOUNTER — Ambulatory Visit: Payer: 59 | Attending: Internal Medicine

## 2019-09-16 DIAGNOSIS — Z23 Encounter for immunization: Secondary | ICD-10-CM

## 2019-09-16 NOTE — Progress Notes (Signed)
   Covid-19 Vaccination Clinic  Name:  Lori Guerrero    MRN: [id number] DOB: [birth year]  09/16/2019  [name] was observed post Covid-19 immunization for 15 minutes without incident. She was provided with Vaccine Information Sheet and instruction to access the V-Safe system.   [name] was instructed to call 911 with any severe reactions post vaccine: Marland Kitchen Difficulty breathing  . Swelling of face and throat  . A fast heartbeat  . A bad rash all over body  . Dizziness and weakness   Immunizations Administered    Name Date Dose VIS Date Route   Pfizer COVID-19 Vaccine 09/16/2019  8:28 AM 0.3 mL 06/13/2019 Intramuscular   Manufacturer: [place]   Lot: [id number]   [place]: [id number]

## 2019-09-17 ENCOUNTER — Telehealth: Payer: 59 | Admitting: Internal Medicine

## 2019-10-06 ENCOUNTER — Ambulatory Visit: Payer: 59 | Admitting: Internal Medicine

## 2019-10-08 ENCOUNTER — Encounter: Payer: Self-pay | Admitting: Internal Medicine

## 2019-10-08 ENCOUNTER — Ambulatory Visit (INDEPENDENT_AMBULATORY_CARE_PROVIDER_SITE_OTHER): Payer: 59 | Admitting: Internal Medicine

## 2019-10-08 ENCOUNTER — Other Ambulatory Visit: Payer: Self-pay

## 2019-10-08 VITALS — BP 116/68 | HR 73 | Temp 97.1°F | Resp 16 | Wt 177.8 lb

## 2019-10-08 DIAGNOSIS — R0602 Shortness of breath: Secondary | ICD-10-CM

## 2019-10-08 DIAGNOSIS — K219 Gastro-esophageal reflux disease without esophagitis: Secondary | ICD-10-CM | POA: Diagnosis not present

## 2019-10-08 DIAGNOSIS — R739 Hyperglycemia, unspecified: Secondary | ICD-10-CM

## 2019-10-08 DIAGNOSIS — M5489 Other dorsalgia: Secondary | ICD-10-CM

## 2019-10-08 DIAGNOSIS — R42 Dizziness and giddiness: Secondary | ICD-10-CM

## 2019-10-08 LAB — BASIC METABOLIC PANEL
BUN: 19 mg/dL (ref 6–23)
CO2: 25 mEq/L (ref 19–32)
Calcium: 9.2 mg/dL (ref 8.4–10.5)
Chloride: 107 mEq/L (ref 96–112)
Creatinine, Ser: 0.83 mg/dL (ref 0.40–1.20)
GFR: 68.44 mL/min (ref >60.00)
Glucose, Bld: 103 mg/dL — ABNORMAL HIGH (ref 70–99)
Potassium: 3.9 mEq/L (ref 3.5–5.1)
Sodium: 139 mEq/L (ref 135–145)

## 2019-10-08 LAB — CBC WITH DIFFERENTIAL/PLATELET
Basophils Absolute: 0 10*3/uL (ref 0.0–0.1)
Basophils Relative: 1.3 % (ref 0.0–3.0)
Eosinophils Absolute: 0.1 10*3/uL (ref 0.0–0.7)
Eosinophils Relative: 1.6 % (ref 0.0–5.0)
HCT: 42.5 % (ref 36.0–46.0)
Hemoglobin: 14.7 g/dL (ref 12.0–15.0)
Lymphocytes Relative: 24 % (ref 12.0–46.0)
Lymphs Abs: 0.9 10*3/uL (ref 0.7–4.0)
MCHC: 34.6 g/dL (ref 30.0–36.0)
MCV: 95.2 fl (ref 78.0–100.0)
Monocytes Absolute: 0.2 10*3/uL (ref 0.1–1.0)
Monocytes Relative: 6 % (ref 3.0–12.0)
Neutro Abs: 2.5 10*3/uL (ref 1.4–7.7)
Neutrophils Relative %: 67.1 % (ref 43.0–77.0)
Platelets: 198 10*3/uL (ref 150.0–400.0)
RBC: 4.46 Mil/uL (ref 3.87–5.11)
RDW: 12.3 % (ref 11.5–15.5)
WBC: 3.7 10*3/uL — ABNORMAL LOW (ref 4.0–10.5)

## 2019-10-08 LAB — URINALYSIS, ROUTINE W REFLEX MICROSCOPIC
Bilirubin Urine: NEGATIVE
Hgb urine dipstick: NEGATIVE
Ketones, ur: NEGATIVE
Nitrite: NEGATIVE
RBC / HPF: NONE SEEN (ref 0–?)
Specific Gravity, Urine: 1.02 (ref 1.000–1.030)
Total Protein, Urine: NEGATIVE
Urine Glucose: NEGATIVE
Urobilinogen, UA: 0.2 (ref 0.0–1.0)
pH: 6.5 (ref 5.0–8.0)

## 2019-10-08 LAB — HEPATIC FUNCTION PANEL
ALT: 24 U/L (ref 0–35)
AST: 24 U/L (ref 0–37)
Albumin: 4 g/dL (ref 3.5–5.2)
Alkaline Phosphatase: 83 U/L (ref 39–117)
Bilirubin, Direct: 0.1 mg/dL (ref 0.0–0.3)
Total Bilirubin: 0.6 mg/dL (ref 0.2–1.2)
Total Protein: 6.4 g/dL (ref 6.0–8.3)

## 2019-10-08 LAB — LIPID PANEL
Cholesterol: 194 mg/dL (ref 0–200)
HDL: 53.2 mg/dL
LDL Cholesterol: 120 mg/dL — ABNORMAL HIGH (ref 0–99)
NonHDL: 140.81
Total CHOL/HDL Ratio: 4
Triglycerides: 104 mg/dL (ref 0.0–149.0)
VLDL: 20.8 mg/dL (ref 0.0–40.0)

## 2019-10-08 LAB — HEMOGLOBIN A1C: Hgb A1c MFr Bld: 5.6 % (ref 4.6–6.5)

## 2019-10-08 NOTE — Progress Notes (Signed)
Patient ID: Lori Guerrero, female   DOB: 1951/01/26, 69 y.o.   MRN: [id number]   Subjective:    Patient ID: Lori Guerrero, female    DOB: 1950-11-07, 69 y.o.   MRN: [id number]  HPI This visit occurred during the SARS-CoV-2 public health emergency.  Safety protocols were in place, including screening questions prior to the visit, additional usage of staff PPE, and extensive cleaning of exam room while observing appropriate contact time as indicated for disinfecting solutions.  Patient here for a scheduled follow up. She has multiple concerns. Reports occasional fatigue.  Occurs mid afternoon.  Noticed some sob with exertion.  No chest pain.  Has had some dizziness. Notices room spinning when lying down.  Saw [facility].  Some decreased appetite.  Some left side back pain.  Planning for EGD and colonoscopy.      Past Medical History:  Diagnosis Date  . GERD (gastroesophageal reflux disease)   . Hyperglycemia   . Hyperplastic colon polyp   . Skin cancer   . Vitamin D deficiency    Past Surgical History:  Procedure Laterality Date  . BREAST BIOPSY     25 years ago  . DEBRIDEMENT TENNIS ELBOW    . MOHS SURGERY     skin cancer   Family History  Problem Relation Age of Onset  . Heart disease Mother   . Cancer Paternal Grandfather        colon  . Cancer Paternal Aunt        Breast cancer  . Breast cancer Paternal Aunt 36  . Breast cancer Maternal Aunt   . Diabetes Maternal Grandmother    Social History   Socioeconomic History  . Marital status: Widowed    Spouse name: Not on file  . Number of children: 1  . Years of education: Not on file  . Highest education level: Not on file  Occupational History  . Not on file  Tobacco Use  . Smoking status: Never Smoker  . Smokeless tobacco: Never Used  Substance and Sexual Activity  . Alcohol use: Yes    Alcohol/week: 0.0 standard drinks    Comment: occassionally  . Drug use: No  . Sexual activity: Not Currently    Comment: 1st  intercourse- 19, partners- 2, widow   Other Topics Concern  . Not on file  Social History Narrative  . Not on file   Social Determinants of Health   Financial Resource Strain:   . Difficulty of Paying Living Expenses:   Food Insecurity:   . Worried About Charity fundraiser in the Last Year:   . Arboriculturist in the Last Year:   Transportation Needs:   . Film/video editor (Medical):   Marland Kitchen Lack of Transportation (Non-Medical):   Physical Activity:   . Days of Exercise per Week:   . Minutes of Exercise per Session:   Stress:   . Feeling of Stress :   Social Connections:   . Frequency of Communication with Friends and Family:   . Frequency of Social Gatherings with Friends and Family:   . Attends Religious Services:   . Active Member of Clubs or Organizations:   . Attends Archivist Meetings:   Marland Kitchen Marital Status:     Outpatient Encounter Medications as of 10/08/2019  Medication Sig  . ALPRAZolam (XANAX) 0.25 MG tablet Take 1 tablet (0.25 mg total) by mouth daily as needed for anxiety.  . Multiple Vitamins-Minerals (MULTIVITAMIN PO) Take  by mouth daily.  Marland Kitchen nystatin cream (MYCOSTATIN) Apply 1 application topically 2 (two) times daily.  Marland Kitchen omeprazole (PRILOSEC) 20 MG capsule TAKE 1 CAPSULE DAILY   No facility-administered encounter medications on file as of 10/08/2019.    Review of Systems  Constitutional: Positive for appetite change and fatigue. Negative for unexpected weight change.  HENT: Negative for congestion and sinus pressure.   Respiratory: Positive for shortness of breath. Negative for cough and chest tightness.        SOB with exertion.   Cardiovascular: Negative for chest pain, palpitations and leg swelling.  Gastrointestinal: Negative for abdominal pain, diarrhea, nausea and vomiting.  Genitourinary: Negative for difficulty urinating and dysuria.  Musculoskeletal: Negative for joint swelling and myalgias.  Skin: Negative for color change and rash.    Neurological: Positive for dizziness. Negative for headaches.  Psychiatric/Behavioral: Negative for agitation and dysphoric mood.       Objective:    Physical Exam Constitutional:      General: She is not in acute distress.    Appearance: Normal appearance.  HENT:     Guerrero: Normocephalic and atraumatic.     Right Ear: External ear normal.     Left Ear: External ear normal.  Eyes:     General: No scleral icterus.       Right eye: No discharge.        Left eye: No discharge.     Conjunctiva/sclera: Conjunctivae normal.  Neck:     Thyroid: No thyromegaly.  Cardiovascular:     Rate and Rhythm: Normal rate and regular rhythm.  Pulmonary:     Effort: No respiratory distress.     Breath sounds: Normal breath sounds. No wheezing.  Abdominal:     General: Bowel sounds are normal.     Palpations: Abdomen is soft.     Tenderness: There is no abdominal tenderness.  Musculoskeletal:        General: No swelling or tenderness.     Cervical back: Neck supple. No tenderness.  Lymphadenopathy:     Cervical: No cervical adenopathy.  Skin:    Findings: No erythema or rash.  Neurological:     Mental Status: She is alert.  Psychiatric:        Mood and Affect: Mood normal.        Behavior: Behavior normal.     BP 116/68   Pulse 73   Temp (!) 97.1 F (36.2 C)   Resp 16   Wt 177 lb 12.8 oz (80.6 kg)   SpO2 98%   BMI 31.50 kg/m  Wt Readings from Last 3 Encounters:  10/08/19 177 lb 12.8 oz (80.6 kg)  06/26/19 176 lb (79.8 kg)  05/09/19 176 lb 6.4 oz (80 kg)     Lab Results  Component Value Date   WBC 3.7 (L) 10/08/2019   HGB 14.7 10/08/2019   HCT 42.5 10/08/2019   PLT 198.0 10/08/2019   GLUCOSE 103 (H) 10/08/2019   CHOL 194 10/08/2019   TRIG 104.0 10/08/2019   HDL 53.20 10/08/2019   LDLCALC 120 (H) 10/08/2019   ALT 24 10/08/2019   AST 24 10/08/2019   NA 139 10/08/2019   K 3.9 10/08/2019   CL 107 10/08/2019   CREATININE 0.83 10/08/2019   BUN 19 10/08/2019   CO2  25 10/08/2019   TSH 1.84 05/08/2019   HGBA1C 5.6 10/08/2019   MICROALBUR <0.7 02/07/2018    DG Bone Density  Result Date: 06/23/2019 EXAM: DUAL X-RAY ABSORPTIOMETRY (DXA) FOR  BONE MINERAL DENSITY IMPRESSION: Technologist: MTB Your patient Tanzie Rothschild completed a BMD test on 06/23/2019 using the [employer] (analysis version: 14.10) manufactured by [employer]. The following summarizes the results of our evaluation. PATIENT BIOGRAPHICAL: Name: Kathrynne, Kulinski Patient ID: [id number] Birth Date: 1951-01-08 Height: 63.0 in. Gender: Female Exam Date: 06/23/2019 Weight: 176.4 lbs. Indications: Caucasian, Height Loss, Parent Hip Fracture, Postmenopausal, Vitamin D Deficiency Fractures: Treatments: Multi-Vitamin, Prilosec ASSESSMENT: The BMD measured at Femur Neck Right is 0.926 g/cm2 with a T-score of -0.8. This patient is considered normal according to [facility]) criteria. The scan quality is good. L-3 and L-4 were excluded due to degenerative changes. Site Region Measured Measured WHO Young Adult BMD Date       Age      Classification T-score AP Spine L1-L2 [age] 67.3 Normal -0.2 1.155 g/cm2 DualFemur Neck Right 06/23/2019 67.3 Normal -0.8 0.926 g/cm2 DualFemur Total Mean 06/23/2019 67.3 Normal -0.2 0.988 g/cm2 Left Forearm Radius 33% 06/23/2019 67.3 Normal -0.2 0.857 g/cm2 World Health Organization [facility]) criteria for post-menopausal, Caucasian Women: Normal:       T-score at or above -1 SD Osteopenia:   T-score between -1 and -2.5 SD Osteoporosis: T-score at or below -2.5 SD RECOMMENDATIONS: 1. All patients should optimize calcium and vitamin D intake. 2. Consider FDA-approved medical therapies in postmenopausal women and men aged 58 years and older, based on the following: a. A hip or vertebral(clinical or morphometric) fracture b. T-score < -2.5 at the femoral neck or spine after appropriate evaluation to exclude secondary causes c. Low bone mass (T-score between -1.0 and  -2.5 at the femoral neck or spine) and a 10-year probability of a hip fracture > 3% or a 10-year probability of a major osteoporosis-related fracture > 20% based on the [place]-adapted WHO algorithm d. Clinician judgment and/or patient preferences may indicate treatment for people with 10-year fracture probabilities above or below these levels FOLLOW-UP: People with diagnosed cases of osteoporosis or at high risk for fracture should have regular bone mineral density tests. For patients eligible for Medicare, routine testing is allowed once every 2 years. The testing frequency can be increased to one year for patients who have rapidly progressing disease, those who are receiving or discontinuing medical therapy to restore bone mass, or have additional risk factors. I have reviewed this report, and agree with the above findings. [facility] Radiology Electronically Signed   By: Lowella Grip III M.D.   On: 06/23/2019 14:59   MM DIAG BREAST TOMO BILATERAL  Result Date: 06/23/2019 CLINICAL DATA:  Patient states that RIGHT breast seems larger than LEFT for greater than a year. EXAM: DIGITAL DIAGNOSTIC BILATERAL MAMMOGRAM WITH CAD AND TOMO COMPARISON:  Previous exams including diagnostic mammogram dated 06/19/2018. ACR Breast Density Category b: There are scattered areas of fibroglandular density. FINDINGS: There are no new dominant masses, suspicious calcifications or secondary signs of malignancy within either breast. By mammogram, patient's appear symmetric in size. Mammographic images were processed with CAD. IMPRESSION: No evidence of malignancy within either breast. RECOMMENDATION: 1.  Screening mammogram in one year.(Code:SM-B-01Y) 2. The patient was instructed to return sooner if the area that she feels becomes larger and/or firmer to palpation, or if a new palpable abnormality is identified in either breast. I have discussed the findings and recommendations with the patient. If applicable, a reminder letter  will be sent to the patient regarding the next appointment. BI-RADS CATEGORY  1: Negative. Electronically Signed   By: Cherlynn Kaiser  Enriqueta Shutter M.D.   On: 06/23/2019 14:37       Assessment & Plan:   Problem List Items Addressed This Visit    Back pain    Some low back pain.  No significant pain.  Check urine to confirm no infection.  Tylenol as directed.  Notify me if persistent pain.        Relevant Orders   Urinalysis, Routine w reflex microscopic (Completed)   Urine Culture (Completed)   Dizziness    Saw [facility].  Check labs as outlined.        GERD (gastroesophageal reflux disease)    On omeprazole.  Follow.        Hyperglycemia - Primary    Low carb diet and exercise.  Follow met b and a1c.       Relevant Orders   CBC with Differential/Platelet (Completed)   Hemoglobin A1c (Completed)   Hepatic function panel (Completed)   Lipid panel (Completed)   Basic metabolic panel (Completed)   SOB (shortness of breath) on exertion    Noticed some occasional fatigue with sob with exertion.  Check routine labs.  EKG - SR with no acute ischemic changes - ventricular rate - 60. Consider cardiology referral to confirm if any further cardiac w/up warranted.            Einar Pheasant, MD

## 2019-10-09 LAB — URINE CULTURE
MICRO NUMBER:: 10336961
SPECIMEN QUALITY:: ADEQUATE

## 2019-10-10 ENCOUNTER — Other Ambulatory Visit: Payer: Self-pay | Admitting: Internal Medicine

## 2019-10-10 DIAGNOSIS — D72819 Decreased white blood cell count, unspecified: Secondary | ICD-10-CM

## 2019-10-10 NOTE — Progress Notes (Signed)
Order placed for f/u lab.   

## 2019-10-18 ENCOUNTER — Encounter: Payer: Self-pay | Admitting: Internal Medicine

## 2019-10-18 ENCOUNTER — Telehealth: Payer: Self-pay | Admitting: Internal Medicine

## 2019-10-18 DIAGNOSIS — R0602 Shortness of breath: Secondary | ICD-10-CM | POA: Insufficient documentation

## 2019-10-18 DIAGNOSIS — R42 Dizziness and giddiness: Secondary | ICD-10-CM | POA: Insufficient documentation

## 2019-10-18 NOTE — Assessment & Plan Note (Signed)
Saw [facility].  Check labs as outlined.

## 2019-10-18 NOTE — Telephone Encounter (Signed)
My chart message sent to pt for update.   

## 2019-10-18 NOTE — Assessment & Plan Note (Signed)
On omeprazole.  Follow.  

## 2019-10-18 NOTE — Assessment & Plan Note (Signed)
Some low back pain.  No significant pain.  Check urine to confirm no infection.  Tylenol as directed.  Notify me if persistent pain.

## 2019-10-18 NOTE — Assessment & Plan Note (Signed)
Low carb diet and exercise.  Follow met b and a1c.  

## 2019-10-18 NOTE — Assessment & Plan Note (Addendum)
Noticed some occasional fatigue with sob with exertion.  Check routine labs.  EKG - SR with no acute ischemic changes - ventricular rate - 60. Consider cardiology referral to confirm if any further cardiac w/up warranted.

## 2019-10-20 LAB — HM COLONOSCOPY

## 2019-11-03 ENCOUNTER — Encounter: Payer: Self-pay | Admitting: Internal Medicine

## 2019-11-03 ENCOUNTER — Telehealth: Payer: Self-pay | Admitting: Internal Medicine

## 2019-11-03 NOTE — Telephone Encounter (Signed)
Pt called and wants to know if she is supposed to have labs before her appointment on 11/06/19? Her lab appt is scheduled for 11/18/19 and she thinks this is wrong. Please call patient.

## 2019-11-03 NOTE — Telephone Encounter (Signed)
Patient stated that she felt like she is stopped up in her nose and having a hard time breathing through her nose. Does not feel bad. Using flonase. Has seasonal allergies and was outside a lot this weekend. No other acute symptoms. Will be evaluated if symptoms worsen or persist.

## 2019-11-03 NOTE — Telephone Encounter (Signed)
Advised patient that she can do her f/u cbc at her appt on 5/6 and cancel 5/18. No need for both appts.

## 2019-11-06 ENCOUNTER — Encounter: Payer: Self-pay | Admitting: Internal Medicine

## 2019-11-06 ENCOUNTER — Ambulatory Visit
Admission: RE | Admit: 2019-11-06 | Discharge: 2019-11-06 | Disposition: A | Discharge status: Home / Self Care | Payer: 59 | Source: Ambulatory Visit | Attending: Internal Medicine | Admitting: Internal Medicine

## 2019-11-06 ENCOUNTER — Ambulatory Visit (INDEPENDENT_AMBULATORY_CARE_PROVIDER_SITE_OTHER): Payer: 59 | Admitting: Internal Medicine

## 2019-11-06 ENCOUNTER — Other Ambulatory Visit: Payer: Self-pay

## 2019-11-06 VITALS — BP 138/76 | HR 81 | Temp 97.8°F | Resp 16 | Ht 63.0 in | Wt 181.4 lb

## 2019-11-06 DIAGNOSIS — R1030 Lower abdominal pain, unspecified: Secondary | ICD-10-CM

## 2019-11-06 DIAGNOSIS — F439 Reaction to severe stress, unspecified: Secondary | ICD-10-CM

## 2019-11-06 DIAGNOSIS — R0602 Shortness of breath: Secondary | ICD-10-CM

## 2019-11-06 DIAGNOSIS — M79675 Pain in left toe(s): Secondary | ICD-10-CM

## 2019-11-06 DIAGNOSIS — K219 Gastro-esophageal reflux disease without esophagitis: Secondary | ICD-10-CM

## 2019-11-06 DIAGNOSIS — R739 Hyperglycemia, unspecified: Secondary | ICD-10-CM | POA: Diagnosis not present

## 2019-11-06 DIAGNOSIS — Z86010 Personal history of colonic polyps: Secondary | ICD-10-CM

## 2019-11-06 LAB — BASIC METABOLIC PANEL
BUN: 21 mg/dL (ref 6–23)
CO2: 25 mEq/L (ref 19–32)
Calcium: 9.3 mg/dL (ref 8.4–10.5)
Chloride: 106 mEq/L (ref 96–112)
Creatinine, Ser: 0.92 mg/dL (ref 0.40–1.20)
GFR: 60.76 mL/min (ref >60.00)
Glucose, Bld: 94 mg/dL (ref 70–99)
Potassium: 4.2 mEq/L (ref 3.5–5.1)
Sodium: 139 mEq/L (ref 135–145)

## 2019-11-06 LAB — CBC WITH DIFFERENTIAL/PLATELET
Basophils Absolute: 0.1 10*3/uL (ref 0.0–0.1)
Basophils Relative: 0.9 % (ref 0.0–3.0)
Eosinophils Absolute: 0.1 10*3/uL (ref 0.0–0.7)
Eosinophils Relative: 1.9 % (ref 0.0–5.0)
HCT: 41.7 % (ref 36.0–46.0)
Hemoglobin: 14.2 g/dL (ref 12.0–15.0)
Lymphocytes Relative: 17.6 % (ref 12.0–46.0)
Lymphs Abs: 1 10*3/uL (ref 0.7–4.0)
MCHC: 34 g/dL (ref 30.0–36.0)
MCV: 94.1 fl (ref 78.0–100.0)
Monocytes Absolute: 0.3 10*3/uL (ref 0.1–1.0)
Monocytes Relative: 5.6 % (ref 3.0–12.0)
Neutro Abs: 4.3 10*3/uL (ref 1.4–7.7)
Neutrophils Relative %: 74 % (ref 43.0–77.0)
Platelets: 227 10*3/uL (ref 150.0–400.0)
RBC: 4.43 Mil/uL (ref 3.87–5.11)
RDW: 12.7 % (ref 11.5–15.5)
WBC: 5.8 10*3/uL (ref 4.0–10.5)

## 2019-11-06 MED ORDER — MUPIROCIN 2 % EX OINT: TOPICAL_OINTMENT | CUTANEOUS | 0 refills | Status: DC

## 2019-11-06 MED ORDER — IOHEXOL 300 MG/ML  SOLN
100.0000 mL | Freq: Once | INTRAMUSCULAR | Status: AC | PRN
  Administered 2019-11-06: 15:00:00 100 mL via INTRAVENOUS

## 2019-11-06 NOTE — Progress Notes (Signed)
Patient ID: Lori Guerrero, female   DOB: Aug 22, 1950, 69 y.o.   MRN: [id number]   Subjective:    Patient ID: Lori Guerrero, female    DOB: 1951/01/13, 69 y.o.   MRN: [id number]  HPI This visit occurred during the SARS-CoV-2 public health emergency.  Safety protocols were in place, including screening questions prior to the visit, additional usage of staff PPE, and extensive cleaning of exam room while observing appropriate contact time as indicated for disinfecting solutions.  Patient here for a scheduled follow up.  She reports noticing some increased LLQ pain.  Unclear etiology.  She has been exercising.  Did pick up a 90 pound weight 3-4x.  Has noticed some nausea and increased burping.  Discussed omeprazole.  Bowels moving.  No fever.  Last visit noticed some increased sob.  EKG - SR with no acute ischemic changes.  Discussed further cardiac w/up then.  She reports still noticing some increased sob.  Some occasional heart racing.  Agrees to further cardiac w/up.  Declines EKG today since just had one last visit.  No chest pain.  Also reports that she had pedicure one week ago.  Noticed some discomfort and infection - left toe.  Has been soaking in epsom salts.  Has also applied peroxide and using essential oils.    Past Medical History:  Diagnosis Date  . GERD (gastroesophageal reflux disease)   . Hyperglycemia   . Hyperplastic colon polyp   . Skin cancer   . Vitamin D deficiency    Past Surgical History:  Procedure Laterality Date  . BREAST BIOPSY     25 years ago  . DEBRIDEMENT TENNIS ELBOW    . MOHS SURGERY     skin cancer   Family History  Problem Relation Age of Onset  . Heart disease Mother   . Cancer Paternal Grandfather        colon  . Cancer Paternal Aunt        Breast cancer  . Breast cancer Paternal Aunt 24  . Breast cancer Maternal Aunt   . Diabetes Maternal Grandmother    Social History   Socioeconomic History  . Marital status: Widowed    Spouse name: Not on  file  . Number of children: 1  . Years of education: Not on file  . Highest education level: Not on file  Occupational History  . Not on file  Tobacco Use  . Smoking status: Never Smoker  . Smokeless tobacco: Never Used  Substance and Sexual Activity  . Alcohol use: Yes    Alcohol/week: 0.0 standard drinks    Comment: occassionally  . Drug use: No  . Sexual activity: Not Currently    Comment: 1st intercourse- 19, partners- 2, widow   Other Topics Concern  . Not on file  Social History Narrative  . Not on file   Social Determinants of Health   Financial Resource Strain:   . Difficulty of Paying Living Expenses:   Food Insecurity:   . Worried About Charity fundraiser in the Last Year:   . Arboriculturist in the Last Year:   Transportation Needs:   . Film/video editor (Medical):   Marland Kitchen Lack of Transportation (Non-Medical):   Physical Activity:   . Days of Exercise per Week:   . Minutes of Exercise per Session:   Stress:   . Feeling of Stress :   Social Connections:   . Frequency of Communication with Friends and Family:   .  Frequency of Social Gatherings with Friends and Family:   . Attends Religious Services:   . Active Member of Clubs or Organizations:   . Attends Archivist Meetings:   Marland Kitchen Marital Status:     Outpatient Encounter Medications as of 11/06/2019  Medication Sig  . ALPRAZolam (XANAX) 0.25 MG tablet Take 1 tablet (0.25 mg total) by mouth daily as needed for anxiety.  . Multiple Vitamins-Minerals (MULTIVITAMIN PO) Take by mouth daily.  . mupirocin ointment (BACTROBAN) 2 % Apply to affected area bid  . nystatin cream (MYCOSTATIN) Apply 1 application topically 2 (two) times daily.  Marland Kitchen omeprazole (PRILOSEC) 20 MG capsule TAKE 1 CAPSULE DAILY   No facility-administered encounter medications on file as of 11/06/2019.   Review of Systems  Constitutional: Negative for appetite change and unexpected weight change.  HENT: Negative for congestion and  sinus pressure.   Respiratory: Positive for shortness of breath. Negative for cough and chest tightness.   Cardiovascular: Negative for chest pain, palpitations and leg swelling.  Gastrointestinal: Positive for abdominal pain and nausea. Negative for diarrhea and vomiting.  Genitourinary: Negative for difficulty urinating and dysuria.  Musculoskeletal: Negative for joint swelling and myalgias.  Skin: Negative for color change and rash.       Toe infection as outlined.   Neurological: Negative for dizziness, light-headedness and headaches.  Psychiatric/Behavioral: Negative for agitation and dysphoric mood.       Objective:    Physical Exam Vitals reviewed.  Constitutional:      General: She is not in acute distress.    Appearance: Normal appearance.  HENT:     Head: Normocephalic and atraumatic.     Right Ear: External ear normal.     Left Ear: External ear normal.  Eyes:     General: No scleral icterus.       Right eye: No discharge.        Left eye: No discharge.     Conjunctiva/sclera: Conjunctivae normal.  Neck:     Thyroid: No thyromegaly.  Cardiovascular:     Rate and Rhythm: Normal rate and regular rhythm.  Pulmonary:     Effort: No respiratory distress.     Breath sounds: Normal breath sounds. No wheezing.  Abdominal:     General: Bowel sounds are normal.     Palpations: Abdomen is soft.     Comments: LLQ tenderness to palpation.  Some bruising noted - LLQ region - resolving.    Musculoskeletal:        General: No swelling or tenderness.     Cervical back: Neck supple. No tenderness.  Lymphadenopathy:     Cervical: No cervical adenopathy.  Skin:    Findings: No erythema or rash.     Comments: Minimal erythema - left toe.    Neurological:     Mental Status: She is alert.  Psychiatric:        Mood and Affect: Mood normal.        Behavior: Behavior normal.     BP 138/76   Pulse 81   Temp 97.8 F (36.6 C)   Resp 16   Ht 5' 3"  (1.6 m)   Wt 181 lb 6.4 oz  (82.3 kg)   SpO2 98%   BMI 32.13 kg/m  Wt Readings from Last 3 Encounters:  11/06/19 181 lb 6.4 oz (82.3 kg)  10/08/19 177 lb 12.8 oz (80.6 kg)  06/26/19 176 lb (79.8 kg)     Lab Results  Component Value Date  WBC 5.8 11/06/2019   HGB 14.2 11/06/2019   HCT 41.7 11/06/2019   PLT 227.0 11/06/2019   GLUCOSE 94 11/06/2019   CHOL 194 10/08/2019   TRIG 104.0 10/08/2019   HDL 53.20 10/08/2019   LDLCALC 120 (H) 10/08/2019   ALT 24 10/08/2019   AST 24 10/08/2019   NA 139 11/06/2019   K 4.2 11/06/2019   CL 106 11/06/2019   CREATININE 0.92 11/06/2019   BUN 21 11/06/2019   CO2 25 11/06/2019   TSH 1.84 05/08/2019   HGBA1C 5.6 10/08/2019   MICROALBUR <0.7 02/07/2018    DG Bone Density  Result Date: 06/23/2019 EXAM: DUAL X-RAY ABSORPTIOMETRY (DXA) FOR BONE MINERAL DENSITY IMPRESSION: Technologist: MTB Your patient Roshawna Colclasure completed a BMD test on 06/23/2019 using the [place] (analysis version: 14.10) manufactured by [employer]. The following summarizes the results of our evaluation. PATIENT BIOGRAPHICAL: Name: Glendy, Barsanti Patient ID: [id number] Birth Date: 22-Nov-1950 Height: 63.0 in. Gender: Female Exam Date: 06/23/2019 Weight: 176.4 lbs. Indications: Caucasian, Height Loss, Parent Hip Fracture, Postmenopausal, Vitamin D Deficiency Fractures: Treatments: Multi-Vitamin, Prilosec ASSESSMENT: The BMD measured at Femur Neck Right is 0.926 g/cm2 with a T-score of -0.8. This patient is considered normal according to [facility] criteria. The scan quality is good. L-3 and L-4 were excluded due to degenerative changes. Site Region Measured Measured WHO Young Adult BMD Date       Age      Classification T-score AP Spine L1-L2 7/23/2019 67.3 Normal -0.2 1.155 g/cm2 DualFemur Neck Right 06/23/2019 67.3 Normal -0.8 0.926 g/cm2 DualFemur Total Mean 06/23/2019 67.3 Normal -0.2 0.988 g/cm2 Left Forearm Radius 33% 06/23/2019 67.3 Normal -0.2 0.857 g/cm2 World  Health Organization [facility]) criteria for post-menopausal, Caucasian Women: Normal:       T-score at or above -1 SD Osteopenia:   T-score between -1 and -2.5 SD Osteoporosis: T-score at or below -2.5 SD RECOMMENDATIONS: 1. All patients should optimize calcium and vitamin D intake. 2. Consider FDA-approved medical therapies in postmenopausal women and men aged 22 years and older, based on the following: a. A hip or vertebral(clinical or morphometric) fracture b. T-score < -2.5 at the femoral neck or spine after appropriate evaluation to exclude secondary causes c. Low bone mass (T-score between -1.0 and -2.5 at the femoral neck or spine) and a 10-year probability of a hip fracture > 3% or a 10-year probability of a major osteoporosis-related fracture > 20% based on the [place]-adapted WHO algorithm d. Clinician judgment and/or patient preferences may indicate treatment for people with 10-year fracture probabilities above or below these levels FOLLOW-UP: People with diagnosed cases of osteoporosis or at high risk for fracture should have regular bone mineral density tests. For patients eligible for Medicare, routine testing is allowed once every 2 years. The testing frequency can be increased to one year for patients who have rapidly progressing disease, those who are receiving or discontinuing medical therapy to restore bone mass, or have additional risk factors. I have reviewed this report, and agree with the above findings. [facility] Electronically Signed   By: Lowella Grip III M.D.   On: 06/23/2019 14:59   MM DIAG BREAST TOMO BILATERAL  Result Date: 06/23/2019 CLINICAL DATA:  Patient states that RIGHT breast seems larger than LEFT for greater than a year. EXAM: DIGITAL DIAGNOSTIC BILATERAL MAMMOGRAM WITH CAD AND TOMO COMPARISON:  Previous exams including diagnostic mammogram dated 06/19/2018. ACR Breast Density Category b: There are scattered areas of fibroglandular density.  FINDINGS: There are no new  dominant masses, suspicious calcifications or secondary signs of malignancy within either breast. By mammogram, patient's appear symmetric in size. Mammographic images were processed with CAD. IMPRESSION: No evidence of malignancy within either breast. RECOMMENDATION: 1.  Screening mammogram in one year.(Code:SM-B-01Y) 2. The patient was instructed to return sooner if the area that she feels becomes larger and/or firmer to palpation, or if a new palpable abnormality is identified in either breast. I have discussed the findings and recommendations with the patient. If applicable, a reminder letter will be sent to the patient regarding the next appointment. BI-RADS CATEGORY  1: Negative. Electronically Signed   By: Franki Cabot M.D.   On: 06/23/2019 14:37       Assessment & Plan:   Problem List Items Addressed This Visit    GERD (gastroesophageal reflux disease)    Discussed omeprazole.        History of colonic polyps    Colonoscopy 07/2016.  Recommended f/u in 3 years.  F/u colonoscopy 10/17/19.  Need results.        Hyperglycemia    Low carb diet and exercise.  Follow met b and a1c.        Lower abdominal pain - Primary    Left lower quadrant pain.  Given tenderness with nausea, will obtain CT abdomen and pelvis.  Could be msk in origin.  Did left heavy weight.  Check CT.  Tylenol.  Follow.        Relevant Orders   CT Abdomen Pelvis W Contrast (Completed)   CBC with Differential/Platelet (Completed)   Basic metabolic panel (Completed)   SOB (shortness of breath) on exertion    See last note for details.  Noticed sob with exertion and fatigue.  EKG - SR with no acute ischemic changes.  Persistent symptoms.  No chest pain.  Occasional increased heart rate.  Discussed further cardiology assessment.  She agrees.  Referral to cardiology for further w/up and evaluation.        Stress    Appears to be handling things relatively well.  Follow.        Toe pain    Had pedicure.  With pain  and erythema now.  Bactroban.  Follow.           I spent 40 minutes with the patient and more than 50% of the time was spent in consultation regarding the above. Time spent discussing her current concerns and symptoms.  Time also spent discussing further w/up and treatment.     Einar Pheasant, MD

## 2019-11-16 ENCOUNTER — Encounter: Payer: Self-pay | Admitting: Internal Medicine

## 2019-11-16 DIAGNOSIS — M79676 Pain in unspecified toe(s): Secondary | ICD-10-CM | POA: Insufficient documentation

## 2019-11-16 NOTE — Assessment & Plan Note (Signed)
Discussed omeprazole.

## 2019-11-16 NOTE — Assessment & Plan Note (Signed)
Had pedicure.  With pain and erythema now.  Bactroban.  Follow.

## 2019-11-16 NOTE — Assessment & Plan Note (Signed)
Left lower quadrant pain.  Given tenderness with nausea, will obtain CT abdomen and pelvis.  Could be msk in origin.  Did left heavy weight.  Check CT.  Tylenol.  Follow.

## 2019-11-16 NOTE — Assessment & Plan Note (Addendum)
Colonoscopy 07/2016.  Recommended f/u in 3 years.  F/u colonoscopy 10/17/19.  Need results.

## 2019-11-16 NOTE — Assessment & Plan Note (Signed)
Low carb diet and exercise.  Follow met b and a1c.   

## 2019-11-16 NOTE — Assessment & Plan Note (Signed)
Appears to be handling things relatively well.  Follow.  

## 2019-11-16 NOTE — Assessment & Plan Note (Signed)
See last note for details.  Noticed sob with exertion and fatigue.  EKG - SR with no acute ischemic changes.  Persistent symptoms.  No chest pain.  Occasional increased heart rate.  Discussed further cardiology assessment.  She agrees.  Referral to cardiology for further w/up and evaluation.

## 2019-11-18 ENCOUNTER — Other Ambulatory Visit: Payer: 59

## 2019-11-26 ENCOUNTER — Encounter: Payer: Self-pay | Admitting: Internal Medicine

## 2019-11-26 NOTE — Telephone Encounter (Signed)
If she is concerned about circulation, etc, - then I need to evaluate her toe/foot to confirm what further w/up is warranted.  I can work her in Friday 12:00.

## 2019-11-26 NOTE — Telephone Encounter (Signed)
Ok if that is the only day she can do.  If she can do Tuesday at 12:00 - would be better for me, but if not  - ok.   Thanks

## 2019-12-02 ENCOUNTER — Ambulatory Visit: Payer: 59 | Admitting: Internal Medicine

## 2019-12-05 ENCOUNTER — Ambulatory Visit (INDEPENDENT_AMBULATORY_CARE_PROVIDER_SITE_OTHER): Payer: 59 | Admitting: Internal Medicine

## 2019-12-05 ENCOUNTER — Other Ambulatory Visit: Payer: Self-pay

## 2019-12-05 DIAGNOSIS — R0602 Shortness of breath: Secondary | ICD-10-CM

## 2019-12-05 DIAGNOSIS — F439 Reaction to severe stress, unspecified: Secondary | ICD-10-CM

## 2019-12-05 DIAGNOSIS — R739 Hyperglycemia, unspecified: Secondary | ICD-10-CM | POA: Diagnosis not present

## 2019-12-05 DIAGNOSIS — M79675 Pain in left toe(s): Secondary | ICD-10-CM

## 2019-12-05 DIAGNOSIS — R230 Cyanosis: Secondary | ICD-10-CM

## 2019-12-05 NOTE — Progress Notes (Signed)
Patient ID: Lori Guerrero, female   DOB: Apr 27, 1951, 69 y.o.   MRN: [id number]   Subjective:    Patient ID: Lori Guerrero, female    DOB: [birth year], 69 y.o.   MRN: [id number]  HPI This visit occurred during the SARS-CoV-2 public health emergency.  Safety protocols were in place, including screening questions prior to the visit, additional usage of staff PPE, and extensive cleaning of exam room while observing appropriate contact time as indicated for disinfecting solutions.  Patient here for a work in appt.  Here for work in to discuss concerns regarding great toe - intermittently turning blue.  States has occurred for years.  No pain.  Not sure if cold.  Was questioning her circulation.  Notices when gets in the shower. No pain associated.  Recent concern regarding infection in toe.  Better.  States she has also noticed a "hump" on her left lower anterior leg.  No redness.  No swelling.  Denies any chest pain or tightness.  No sob.  Discussed cardiology referral.  She does not feel needs referral at this time.  Eating.  No nausea or vomiting reported.    Past Medical History:  Diagnosis Date  . GERD (gastroesophageal reflux disease)   . Hyperglycemia   . Hyperplastic colon polyp   . Skin cancer   . Vitamin D deficiency    Past Surgical History:  Procedure Laterality Date  . BREAST BIOPSY     25 years ago  . DEBRIDEMENT TENNIS ELBOW    . MOHS SURGERY     skin cancer   Family History  Problem Relation Age of Onset  . Heart disease Mother   . Cancer Paternal Grandfather        colon  . Cancer Paternal Aunt        Breast cancer  . Breast cancer Paternal Aunt 24  . Breast cancer Maternal Aunt   . Diabetes Maternal Grandmother    Social History   Socioeconomic History  . Marital status: Widowed    Spouse name: Not on file  . Number of children: 1  . Years of education: Not on file  . Highest education level: Not on file  Occupational History  . Not on file  Tobacco Use  .  Smoking status: Never Smoker  . Smokeless tobacco: Never Used  Substance and Sexual Activity  . Alcohol use: Yes    Alcohol/week: 0.0 standard drinks    Comment: occassionally  . Drug use: No  . Sexual activity: Not Currently    Comment: 1st intercourse- 19, partners- 2, widow   Other Topics Concern  . Not on file  Social History Narrative  . Not on file   Social Determinants of Health   Financial Resource Strain:   . Difficulty of Paying Living Expenses:   Food Insecurity:   . Worried About Charity fundraiser in the Last Year:   . Arboriculturist in the Last Year:   Transportation Needs:   . Film/video editor (Medical):   Marland Kitchen Lack of Transportation (Non-Medical):   Physical Activity:   . Days of Exercise per Week:   . Minutes of Exercise per Session:   Stress:   . Feeling of Stress :   Social Connections:   . Frequency of Communication with Friends and Family:   . Frequency of Social Gatherings with Friends and Family:   . Attends Religious Services:   . Active Member of Clubs or Organizations:   .  Attends Archivist Meetings:   Marland Kitchen Marital Status:     Outpatient Encounter Medications as of 12/05/2019  Medication Sig  . ALPRAZolam (XANAX) 0.25 MG tablet Take 1 tablet (0.25 mg total) by mouth daily as needed for anxiety.  . Multiple Vitamins-Minerals (MULTIVITAMIN PO) Take by mouth daily.  . mupirocin ointment (BACTROBAN) 2 % Apply to affected area bid  . nystatin cream (MYCOSTATIN) Apply 1 application topically 2 (two) times daily.  Marland Kitchen omeprazole (PRILOSEC) 20 MG capsule TAKE 1 CAPSULE DAILY   No facility-administered encounter medications on file as of 12/05/2019.    Review of Systems  Constitutional: Negative for appetite change and unexpected weight change.  Respiratory: Negative for cough, chest tightness and shortness of breath.   Cardiovascular: Negative for chest pain, palpitations and leg swelling.  Gastrointestinal: Negative for abdominal pain,  diarrhea, nausea and vomiting.  Musculoskeletal: Negative for joint swelling and myalgias.  Skin: Negative for color change and rash.  Neurological: Negative for dizziness, light-headedness and headaches.  Psychiatric/Behavioral: Negative for agitation and dysphoric mood.       Objective:    Physical Exam Vitals reviewed.  Constitutional:      General: She is not in acute distress.    Appearance: Normal appearance.  HENT:     Head: Normocephalic and atraumatic.     Right Ear: External ear normal.     Left Ear: External ear normal.  Eyes:     General:        Right eye: No discharge.        Left eye: No discharge.     Conjunctiva/sclera: Conjunctivae normal.  Neck:     Thyroid: No thyromegaly.  Cardiovascular:     Rate and Rhythm: Normal rate and regular rhythm.  Pulmonary:     Effort: No respiratory distress.     Breath sounds: Normal breath sounds. No wheezing.  Abdominal:     General: Bowel sounds are normal.     Palpations: Abdomen is soft.     Tenderness: There is no abdominal tenderness.  Musculoskeletal:        General: No swelling or tenderness.     Cervical back: Neck supple. No tenderness.     Comments: No pain or swelling - great toe.   DP pulses palpable and equal bilateral.    Lymphadenopathy:     Cervical: No cervical adenopathy.  Skin:    Findings: No erythema or rash.  Neurological:     Mental Status: She is alert.  Psychiatric:        Mood and Affect: Mood normal.        Behavior: Behavior normal.     BP 116/64   Pulse 76   Temp 97.6 F (36.4 C)   Resp 16   Ht 5' 3"  (1.6 m)   Wt 178 lb 9.6 oz (81 kg)   SpO2 98%   BMI 31.64 kg/m  Wt Readings from Last 3 Encounters:  12/05/19 178 lb 9.6 oz (81 kg)  11/06/19 181 lb 6.4 oz (82.3 kg)  10/08/19 177 lb 12.8 oz (80.6 kg)     Lab Results  Component Value Date   WBC 5.8 11/06/2019   HGB 14.2 11/06/2019   HCT 41.7 11/06/2019   PLT 227.0 11/06/2019   GLUCOSE 94 11/06/2019   CHOL 194  10/08/2019   TRIG 104.0 10/08/2019   HDL 53.20 10/08/2019   LDLCALC 120 (H) 10/08/2019   ALT 24 10/08/2019   AST 24 10/08/2019   NA  139 11/06/2019   K 4.2 11/06/2019   CL 106 11/06/2019   CREATININE 0.92 11/06/2019   BUN 21 11/06/2019   CO2 25 11/06/2019   TSH 1.84 05/08/2019   HGBA1C 5.6 10/08/2019   MICROALBUR <0.7 02/07/2018    CT Abdomen Pelvis W Contrast  Result Date: 11/06/2019 CLINICAL DATA:  69 year old with left lower quadrant pain. EXAM: CT ABDOMEN AND PELVIS WITH CONTRAST TECHNIQUE: Multidetector CT imaging of the abdomen and pelvis was performed using the standard protocol following bolus administration of intravenous contrast. CONTRAST:  183m OMNIPAQUE IOHEXOL 300 MG/ML  SOLN COMPARISON:  None. FINDINGS: Lower chest: Lung bases are clear. Hepatobiliary: Normal appearance of the liver, gallbladder and portal venous system. No biliary dilatation. Pancreas: Unremarkable. No pancreatic ductal dilatation or surrounding inflammatory changes. Spleen: Normal in size without focal abnormality. Adrenals/Urinary Tract: Normal adrenal glands. Normal appearance of the urinary bladder. Normal appearance of both kidneys without hydronephrosis. No suspicious renal lesions. Stomach/Bowel: Small hiatal hernia. Otherwise, normal appearance of the stomach and duodenum. Extensive diverticulosis involving the sigmoid colon without acute inflammation. No evidence for bowel obstruction. Vascular/Lymphatic: No significant vascular findings are present. No enlarged abdominal or pelvic lymph nodes. Reproductive: Uterus and bilateral adnexa are unremarkable. Other: Negative for free fluid. Negative for free air. Tiny umbilical hernia containing fat. Musculoskeletal: No acute abnormality. IMPRESSION: 1. No acute abnormality in the abdomen or pelvis. 2. Colonic diverticulosis without acute bowel inflammation. Electronically Signed   By: AMarkus DaftM.D.   On: 11/06/2019 15:05       Assessment & Plan:    Problem List Items Addressed This Visit    Blue toes    Has only noticed in left great toe.  Present for years. No pain.  Unsure of temperature change.  DP pulses palpable and equal biltaterally.  No other digits or limb issues.  Discussed referral to vascular surgery for further evaluation. She wants to monitor.        Hyperglycemia    Low carb diet and exercise.  Follow met b and a1c.        SOB (shortness of breath) on exertion    Previously described sob.  States is better now.  States when exercises and is active, reports no chest pain or sob.  Discussed referral to cardiology.  She does not feel she needs at this time.  Follow.        Stress    Increased stress.  Overall appears to be handling things relatively well.  Follow.        Toe pain    Previous pain and infection.  Better.  No pain.  No evidence of infection.  Follow.            CEinar Pheasant MD

## 2019-12-06 ENCOUNTER — Encounter: Payer: Self-pay | Admitting: Internal Medicine

## 2019-12-06 DIAGNOSIS — R230 Cyanosis: Secondary | ICD-10-CM | POA: Insufficient documentation

## 2019-12-06 NOTE — Assessment & Plan Note (Signed)
Increased stress.  Overall appears to be handling things relatively well.  Follow.   

## 2019-12-06 NOTE — Assessment & Plan Note (Signed)
Previous pain and infection.  Better.  No pain.  No evidence of infection.  Follow.

## 2019-12-06 NOTE — Assessment & Plan Note (Signed)
Low carb diet and exercise.  Follow met b and a1c.   

## 2019-12-06 NOTE — Assessment & Plan Note (Signed)
Previously described sob.  States is better now.  States when exercises and is active, reports no chest pain or sob.  Discussed referral to cardiology.  She does not feel she needs at this time.  Follow.

## 2019-12-06 NOTE — Assessment & Plan Note (Signed)
Has only noticed in left great toe.  Present for years. No pain.  Unsure of temperature change.  DP pulses palpable and equal biltaterally.  No other digits or limb issues.  Discussed referral to vascular surgery for further evaluation. She wants to monitor.

## 2020-02-02 ENCOUNTER — Encounter: Payer: Self-pay | Admitting: Internal Medicine

## 2020-02-13 ENCOUNTER — Ambulatory Visit: Payer: 59 | Admitting: Internal Medicine

## 2020-03-22 ENCOUNTER — Encounter: Payer: Self-pay | Admitting: Internal Medicine

## 2020-03-23 NOTE — Telephone Encounter (Signed)
Scheduled

## 2020-04-19 ENCOUNTER — Ambulatory Visit (INDEPENDENT_AMBULATORY_CARE_PROVIDER_SITE_OTHER): Payer: 59

## 2020-04-19 ENCOUNTER — Other Ambulatory Visit: Payer: Self-pay

## 2020-04-19 ENCOUNTER — Ambulatory Visit (INDEPENDENT_AMBULATORY_CARE_PROVIDER_SITE_OTHER): Payer: 59 | Admitting: Internal Medicine

## 2020-04-19 VITALS — BP 120/70 | HR 67 | Temp 98.4°F | Resp 16 | Ht 63.0 in | Wt 178.4 lb

## 2020-04-19 DIAGNOSIS — M5489 Other dorsalgia: Secondary | ICD-10-CM | POA: Diagnosis not present

## 2020-04-19 DIAGNOSIS — R739 Hyperglycemia, unspecified: Secondary | ICD-10-CM | POA: Diagnosis not present

## 2020-04-19 DIAGNOSIS — Z13220 Encounter for screening for lipoid disorders: Secondary | ICD-10-CM | POA: Diagnosis not present

## 2020-04-19 DIAGNOSIS — Z1231 Encounter for screening mammogram for malignant neoplasm of breast: Secondary | ICD-10-CM

## 2020-04-19 DIAGNOSIS — Z86010 Personal history of colonic polyps: Secondary | ICD-10-CM

## 2020-04-19 DIAGNOSIS — R1030 Lower abdominal pain, unspecified: Secondary | ICD-10-CM | POA: Diagnosis not present

## 2020-04-19 DIAGNOSIS — F439 Reaction to severe stress, unspecified: Secondary | ICD-10-CM

## 2020-04-19 DIAGNOSIS — K219 Gastro-esophageal reflux disease without esophagitis: Secondary | ICD-10-CM

## 2020-04-19 LAB — URINALYSIS, ROUTINE W REFLEX MICROSCOPIC
Bilirubin Urine: NEGATIVE
Hgb urine dipstick: NEGATIVE
Ketones, ur: NEGATIVE
Leukocytes,Ua: NEGATIVE
Nitrite: NEGATIVE
RBC / HPF: NONE SEEN (ref 0–?)
Specific Gravity, Urine: 1.02 (ref 1.000–1.030)
Total Protein, Urine: NEGATIVE
Urine Glucose: NEGATIVE
Urobilinogen, UA: 0.2 (ref 0.0–1.0)
pH: 6.5 (ref 5.0–8.0)

## 2020-04-19 LAB — CBC WITH DIFFERENTIAL/PLATELET
Basophils Absolute: 0.1 10*3/uL (ref 0.0–0.1)
Basophils Relative: 1.3 % (ref 0.0–3.0)
Eosinophils Absolute: 0.1 10*3/uL (ref 0.0–0.7)
Eosinophils Relative: 2.7 % (ref 0.0–5.0)
HCT: 40.1 % (ref 36.0–46.0)
Hemoglobin: 13.8 g/dL (ref 12.0–15.0)
Lymphocytes Relative: 22.4 % (ref 12.0–46.0)
Lymphs Abs: 1 10*3/uL (ref 0.7–4.0)
MCHC: 34.5 g/dL (ref 30.0–36.0)
MCV: 94.3 fl (ref 78.0–100.0)
Monocytes Absolute: 0.3 10*3/uL (ref 0.1–1.0)
Monocytes Relative: 6.4 % (ref 3.0–12.0)
Neutro Abs: 3 10*3/uL (ref 1.4–7.7)
Neutrophils Relative %: 67.2 % (ref 43.0–77.0)
Platelets: 220 10*3/uL (ref 150.0–400.0)
RBC: 4.25 Mil/uL (ref 3.87–5.11)
RDW: 13.2 % (ref 11.5–15.5)
WBC: 4.5 10*3/uL (ref 4.0–10.5)

## 2020-04-19 LAB — LIPID PANEL
Cholesterol: 191 mg/dL (ref 0–200)
HDL: 56.8 mg/dL (ref >39.00)
LDL Cholesterol: 118 mg/dL — ABNORMAL HIGH (ref 0–99)
NonHDL: 134.65
Total CHOL/HDL Ratio: 3
Triglycerides: 81 mg/dL (ref 0.0–149.0)
VLDL: 16.2 mg/dL (ref 0.0–40.0)

## 2020-04-19 LAB — BASIC METABOLIC PANEL
BUN: 19 mg/dL (ref 6–23)
CO2: 26 mEq/L (ref 19–32)
Calcium: 9.1 mg/dL (ref 8.4–10.5)
Chloride: 105 mEq/L (ref 96–112)
Creatinine, Ser: 0.84 mg/dL (ref 0.40–1.20)
GFR: 71.34 mL/min (ref >60.00)
Glucose, Bld: 105 mg/dL — ABNORMAL HIGH (ref 70–99)
Potassium: 4.3 mEq/L (ref 3.5–5.1)
Sodium: 139 mEq/L (ref 135–145)

## 2020-04-19 LAB — HEPATIC FUNCTION PANEL
ALT: 18 U/L (ref 0–35)
AST: 21 U/L (ref 0–37)
Albumin: 4 g/dL (ref 3.5–5.2)
Alkaline Phosphatase: 111 U/L (ref 39–117)
Bilirubin, Direct: 0.1 mg/dL (ref 0.0–0.3)
Total Bilirubin: 0.5 mg/dL (ref 0.2–1.2)
Total Protein: 6.5 g/dL (ref 6.0–8.3)

## 2020-04-19 LAB — HEMOGLOBIN A1C: Hgb A1c MFr Bld: 6.1 % (ref 4.6–6.5)

## 2020-04-19 LAB — LIPASE: Lipase: 42 U/L (ref 11.0–59.0)

## 2020-04-19 LAB — TSH: TSH: 2.29 u[IU]/mL (ref 0.35–4.50)

## 2020-04-19 LAB — AMYLASE: Amylase: 48 U/L (ref 27–131)

## 2020-04-19 NOTE — Progress Notes (Signed)
Patient ID: Lori Guerrero, female   DOB: Dec 23, 1950, 69 y.o.   MRN: [id number]   Subjective:    Patient ID: Lori Guerrero, female    DOB: Feb 01, 1951, 69 y.o.   MRN: [id number]  HPI This visit occurred during the SARS-CoV-2 public health emergency.  Safety protocols were in place, including screening questions prior to the visit, additional usage of staff PPE, and extensive cleaning of exam room while observing appropriate contact time as indicated for disinfecting solutions.  Patient here for a scheduled follow up.  She reports she is having mid back pain and LLQ pain with suprapubic pain.  She has had previous lower abdominal pain.  CT unrevealing - diverticulosis.  Pain has returned.  Describes mid to low back pain.  Is worsened with palpation.  No known injury.  No sob.  No chest pain.  Eating.  No nausea or vomiting.  Some previous bloating, this is not constant.  Also describes suprapubic pain and LLQ pain.  No dysuria or hematuria.  No vaginal symptoms reported.  No bowel change.   Past Medical History:  Diagnosis Date  . GERD (gastroesophageal reflux disease)   . Hyperglycemia   . Hyperplastic colon polyp   . Skin cancer   . Vitamin D deficiency    Past Surgical History:  Procedure Laterality Date  . BREAST BIOPSY     25 years ago  . DEBRIDEMENT TENNIS ELBOW    . MOHS SURGERY     skin cancer   Family History  Problem Relation Age of Onset  . Heart disease Mother   . Cancer Paternal Grandfather        colon  . Cancer Paternal Aunt        Breast cancer  . Breast cancer Paternal Aunt 56  . Breast cancer Maternal Aunt   . Diabetes Maternal Grandmother    Social History   Socioeconomic History  . Marital status: Widowed    Spouse name: Not on file  . Number of children: 1  . Years of education: Not on file  . Highest education level: Not on file  Occupational History  . Not on file  Tobacco Use  . Smoking status: Never Smoker  . Smokeless tobacco: Never Used    Vaping Use  . Vaping Use: Never used  Substance and Sexual Activity  . Alcohol use: Yes    Alcohol/week: 0.0 standard drinks    Comment: occassionally  . Drug use: No  . Sexual activity: Not Currently    Comment: 1st intercourse- 19, partners- 2, widow   Other Topics Concern  . Not on file  Social History Narrative  . Not on file   Social Determinants of Health   Financial Resource Strain:   . Difficulty of Paying Living Expenses: Not on file  Food Insecurity:   . Worried About Charity fundraiser in the Last Year: Not on file  . Ran Out of Food in the Last Year: Not on file  Transportation Needs:   . Lack of Transportation (Medical): Not on file  . Lack of Transportation (Non-Medical): Not on file  Physical Activity:   . Days of Exercise per Week: Not on file  . Minutes of Exercise per Session: Not on file  Stress:   . Feeling of Stress : Not on file  Social Connections:   . Frequency of Communication with Friends and Family: Not on file  . Frequency of Social Gatherings with Friends and Family: Not on file  .  Attends Religious Services: Not on file  . Active Member of Clubs or Organizations: Not on file  . Attends Archivist Meetings: Not on file  . Marital Status: Not on file    Outpatient Encounter Medications as of 04/19/2020  Medication Sig  . ALPRAZolam (XANAX) 0.25 MG tablet Take 1 tablet (0.25 mg total) by mouth daily as needed for anxiety.  . Multiple Vitamins-Minerals (MULTIVITAMIN PO) Take by mouth daily.  . mupirocin ointment (BACTROBAN) 2 % Apply to affected area bid  . nystatin cream (MYCOSTATIN) Apply 1 application topically 2 (two) times daily.  Marland Kitchen omeprazole (PRILOSEC) 20 MG capsule TAKE 1 CAPSULE DAILY   No facility-administered encounter medications on file as of 04/19/2020.    Review of Systems  Constitutional: Negative for appetite change and unexpected weight change.  HENT: Negative for congestion and sinus pressure.   Respiratory:  Negative for cough, chest tightness and shortness of breath.   Cardiovascular: Negative for chest pain and palpitations.  Gastrointestinal: Negative for diarrhea, nausea and vomiting.       LLQ pain.    Genitourinary: Negative for difficulty urinating and dysuria.  Musculoskeletal: Negative for joint swelling and myalgias.       Mid back pain as outlined.    Skin: Negative for color change and rash.  Neurological: Negative for dizziness, light-headedness and headaches.  Psychiatric/Behavioral: Negative for agitation and dysphoric mood.       Objective:    Physical Exam Vitals reviewed.  Constitutional:      General: She is not in acute distress.    Appearance: Normal appearance.  HENT:     Head: Normocephalic and atraumatic.     Right Ear: External ear normal.     Left Ear: External ear normal.  Eyes:     General: No scleral icterus.       Right eye: No discharge.        Left eye: No discharge.     Conjunctiva/sclera: Conjunctivae normal.  Neck:     Thyroid: No thyromegaly.  Cardiovascular:     Rate and Rhythm: Normal rate and regular rhythm.  Pulmonary:     Effort: No respiratory distress.     Breath sounds: Normal breath sounds. No wheezing.  Abdominal:     General: Bowel sounds are normal.     Palpations: Abdomen is soft.     Comments: LLQ pain to palpation.    Musculoskeletal:        General: No swelling or tenderness.     Cervical back: Neck supple. No tenderness.  Lymphadenopathy:     Cervical: No cervical adenopathy.  Skin:    Findings: No erythema or rash.  Neurological:     Mental Status: She is alert.  Psychiatric:        Mood and Affect: Mood normal.        Behavior: Behavior normal.     BP 120/70   Pulse 67   Temp 98.4 F (36.9 C) (Oral)   Resp 16   Ht $R'5\' 3"'fL$  (1.6 m)   Wt 178 lb 6.4 oz (80.9 kg)   SpO2 98%   BMI 31.60 kg/m  Wt Readings from Last 3 Encounters:  04/19/20 178 lb 6.4 oz (80.9 kg)  12/05/19 178 lb 9.6 oz (81 kg)  11/06/19 181  lb 6.4 oz (82.3 kg)     Lab Results  Component Value Date   WBC 4.5 04/19/2020   HGB 13.8 04/19/2020   HCT 40.1 04/19/2020   PLT  220.0 04/19/2020   GLUCOSE 105 (H) 04/19/2020   CHOL 191 04/19/2020   TRIG 81.0 04/19/2020   HDL 56.80 04/19/2020   LDLCALC 118 (H) 04/19/2020   ALT 18 04/19/2020   AST 21 04/19/2020   NA 139 04/19/2020   K 4.3 04/19/2020   CL 105 04/19/2020   CREATININE 0.84 04/19/2020   BUN 19 04/19/2020   CO2 26 04/19/2020   TSH 2.29 04/19/2020   HGBA1C 6.1 04/19/2020   MICROALBUR <0.7 02/07/2018    CT Abdomen Pelvis W Contrast  Result Date: 11/06/2019 CLINICAL DATA:  69 year old with left lower quadrant pain. EXAM: CT ABDOMEN AND PELVIS WITH CONTRAST TECHNIQUE: Multidetector CT imaging of the abdomen and pelvis was performed using the standard protocol following bolus administration of intravenous contrast. CONTRAST:  164mL OMNIPAQUE IOHEXOL 300 MG/ML  SOLN COMPARISON:  None. FINDINGS: Lower chest: Lung bases are clear. Hepatobiliary: Normal appearance of the liver, gallbladder and portal venous system. No biliary dilatation. Pancreas: Unremarkable. No pancreatic ductal dilatation or surrounding inflammatory changes. Spleen: Normal in size without focal abnormality. Adrenals/Urinary Tract: Normal adrenal glands. Normal appearance of the urinary bladder. Normal appearance of both kidneys without hydronephrosis. No suspicious renal lesions. Stomach/Bowel: Small hiatal hernia. Otherwise, normal appearance of the stomach and duodenum. Extensive diverticulosis involving the sigmoid colon without acute inflammation. No evidence for bowel obstruction. Vascular/Lymphatic: No significant vascular findings are present. No enlarged abdominal or pelvic lymph nodes. Reproductive: Uterus and bilateral adnexa are unremarkable. Other: Negative for free fluid. Negative for free air. Tiny umbilical hernia containing fat. Musculoskeletal: No acute abnormality. IMPRESSION: 1. No acute  abnormality in the abdomen or pelvis. 2. Colonic diverticulosis without acute bowel inflammation. Electronically Signed   By: Markus Daft M.D.   On: 11/06/2019 15:05       Assessment & Plan:   Problem List Items Addressed This Visit    Stress    Overall appears to be handling things well.  Follow.        Lower abdominal pain    Persistent LLQ pain as outlined.  Mid back pain.  Check thoracic spine and L-S spine xray.  Check urine and routine labs.  Question of hernia.  Recent CT unrevealing.  Further w/up pending results of above.        Relevant Orders   CBC with Differential/Platelet (Completed)   Hepatic function panel (Completed)   Basic metabolic panel (Completed)   Urinalysis, Routine w reflex microscopic (Completed)   Amylase (Completed)   Lipase (Completed)   Hyperglycemia    Low carb diet and exercise.  Follow met b and a1c.        Relevant Orders   TSH (Completed)   Hemoglobin A1c (Completed)   History of colonic polyps    Had f/u colonoscopy 10/2019.  Need report.       GERD (gastroesophageal reflux disease)    On omeprazole.  Upper symptoms controlled.        Back pain - Primary    Check xray as outlined.        Relevant Orders   DG Thoracic Spine 2 View (Completed)   DG Lumbar Spine 2-3 Views (Completed)    Other Visit Diagnoses    Screening cholesterol level       Relevant Orders   Lipid panel (Completed)   Encounter for screening mammogram for malignant neoplasm of breast       Relevant Orders   MM 3D SCREEN BREAST BILATERAL       Yael Coppess  Nicki Reaper, MD

## 2020-04-23 ENCOUNTER — Other Ambulatory Visit: Payer: Self-pay | Admitting: Internal Medicine

## 2020-04-23 ENCOUNTER — Ambulatory Visit: Payer: 59 | Admitting: Internal Medicine

## 2020-04-23 DIAGNOSIS — R1030 Lower abdominal pain, unspecified: Secondary | ICD-10-CM

## 2020-04-23 NOTE — Progress Notes (Signed)
Order placed for surgery referral.  

## 2020-04-25 ENCOUNTER — Encounter: Payer: Self-pay | Admitting: Internal Medicine

## 2020-04-25 NOTE — Assessment & Plan Note (Signed)
On omeprazole.  Upper symptoms controlled.   

## 2020-04-25 NOTE — Assessment & Plan Note (Signed)
Low carb diet and exercise.  Follow met b and a1c.   

## 2020-04-25 NOTE — Assessment & Plan Note (Signed)
Overall appears to be handling things well.  Follow.  ?

## 2020-04-25 NOTE — Assessment & Plan Note (Signed)
Persistent LLQ pain as outlined.  Mid back pain.  Check thoracic spine and L-S spine xray.  Check urine and routine labs.  Question of hernia.  Recent CT unrevealing.  Further w/up pending results of above.

## 2020-04-25 NOTE — Assessment & Plan Note (Signed)
Had f/u colonoscopy 10/2019.  Need report.

## 2020-04-25 NOTE — Assessment & Plan Note (Signed)
Check xray as outlined.   

## 2020-05-18 ENCOUNTER — Telehealth: Payer: Self-pay | Admitting: Internal Medicine

## 2020-05-18 ENCOUNTER — Telehealth (INDEPENDENT_AMBULATORY_CARE_PROVIDER_SITE_OTHER): Payer: 59 | Admitting: Internal Medicine

## 2020-05-18 DIAGNOSIS — R059 Cough, unspecified: Secondary | ICD-10-CM | POA: Diagnosis not present

## 2020-05-18 MED ORDER — ALBUTEROL SULFATE HFA 108 (90 BASE) MCG/ACT IN AERS: 2.0000 | INHALATION_SPRAY | Freq: Four times a day (QID) | RESPIRATORY_TRACT | 0 refills | Status: DC | PRN

## 2020-05-18 NOTE — Telephone Encounter (Signed)
Pt would like a call before her appt this afternoon she had to change to a phone visit because she is having sinus issues

## 2020-05-18 NOTE — Telephone Encounter (Signed)
Pt did not need anything from me. Lori Guerrero is going to keep appt with Dr Nicki Reaper in order to get medicine for being sick. Lori Guerrero has been using OTC meds with no relief.

## 2020-05-18 NOTE — Progress Notes (Deleted)
Patient ID: Lori Guerrero, female   DOB: [birth year], 69 y.o.   MRN: [id number]   Subjective:    Patient ID: Lori Guerrero, female    DOB: [birth year], 69 y.o.   MRN: [id number]  HPI  Patient here for ***  Past Medical History:  Diagnosis Date  . GERD (gastroesophageal reflux disease)   . Hyperglycemia   . Hyperplastic colon polyp   . Skin cancer   . Vitamin D deficiency    Past Surgical History:  Procedure Laterality Date  . BREAST BIOPSY     25 years ago  . DEBRIDEMENT TENNIS ELBOW    . MOHS SURGERY     skin cancer   Family History  Problem Relation Age of Onset  . Heart disease Mother   . Cancer Paternal Grandfather        colon  . Cancer Paternal Aunt        Breast cancer  . Breast cancer Paternal Aunt 52  . Breast cancer Maternal Aunt   . Diabetes Maternal Grandmother    Social History   Socioeconomic History  . Marital status: Widowed    Spouse name: Not on file  . Number of children: 1  . Years of education: Not on file  . Highest education level: Not on file  Occupational History  . Not on file  Tobacco Use  . Smoking status: Never Smoker  . Smokeless tobacco: Never Used  Vaping Use  . Vaping Use: Never used  Substance and Sexual Activity  . Alcohol use: Yes    Alcohol/week: 0.0 standard drinks    Comment: occassionally  . Drug use: No  . Sexual activity: Not Currently    Comment: 1st intercourse- 19, partners- 2, widow   Other Topics Concern  . Not on file  Social History Narrative  . Not on file   Social Determinants of Health   Financial Resource Strain:   . Difficulty of Paying Living Expenses: Not on file  Food Insecurity:   . Worried About Charity fundraiser in the Last Year: Not on file  . Ran Out of Food in the Last Year: Not on file  Transportation Needs:   . Lack of Transportation (Medical): Not on file  . Lack of Transportation (Non-Medical): Not on file  Physical Activity:   . Days of Exercise per Week: Not on file  .  Minutes of Exercise per Session: Not on file  Stress:   . Feeling of Stress : Not on file  Social Connections:   . Frequency of Communication with Friends and Family: Not on file  . Frequency of Social Gatherings with Friends and Family: Not on file  . Attends Religious Services: Not on file  . Active Member of Clubs or Organizations: Not on file  . Attends Archivist Meetings: Not on file  . Marital Status: Not on file     Review of Systems     Objective:    Physical Exam  Ht 5\' 3"  (1.6 m)   Wt 178 lb (80.7 kg)   BMI 31.53 kg/m  Wt Readings from Last 3 Encounters:  05/18/20 178 lb (80.7 kg)  04/19/20 178 lb 6.4 oz (80.9 kg)  12/05/19 178 lb 9.6 oz (81 kg)     Lab Results  Component Value Date   WBC 4.5 04/19/2020   HGB 13.8 04/19/2020   HCT 40.1 04/19/2020   PLT 220.0 04/19/2020   GLUCOSE 105 (H) 04/19/2020   CHOL 191  04/19/2020   TRIG 81.0 04/19/2020   HDL 56.80 04/19/2020   LDLCALC 118 (H) 04/19/2020   ALT 18 04/19/2020   AST 21 04/19/2020   NA 139 04/19/2020   K 4.3 04/19/2020   CL 105 04/19/2020   CREATININE 0.84 04/19/2020   BUN 19 04/19/2020   CO2 26 04/19/2020   TSH 2.29 04/19/2020   HGBA1C 6.1 04/19/2020   MICROALBUR <0.7 02/07/2018    CT Abdomen Pelvis W Contrast  Result Date: 11/06/2019 CLINICAL DATA:  69 year old with left lower quadrant pain. EXAM: CT ABDOMEN AND PELVIS WITH CONTRAST TECHNIQUE: Multidetector CT imaging of the abdomen and pelvis was performed using the standard protocol following bolus administration of intravenous contrast. CONTRAST:  195mL OMNIPAQUE IOHEXOL 300 MG/ML  SOLN COMPARISON:  None. FINDINGS: Lower chest: Lung bases are clear. Hepatobiliary: Normal appearance of the liver, gallbladder and portal venous system. No biliary dilatation. Pancreas: Unremarkable. No pancreatic ductal dilatation or surrounding inflammatory changes. Spleen: Normal in size without focal abnormality. Adrenals/Urinary Tract: Normal adrenal  glands. Normal appearance of the urinary bladder. Normal appearance of both kidneys without hydronephrosis. No suspicious renal lesions. Stomach/Bowel: Small hiatal hernia. Otherwise, normal appearance of the stomach and duodenum. Extensive diverticulosis involving the sigmoid colon without acute inflammation. No evidence for bowel obstruction. Vascular/Lymphatic: No significant vascular findings are present. No enlarged abdominal or pelvic lymph nodes. Reproductive: Uterus and bilateral adnexa are unremarkable. Other: Negative for free fluid. Negative for free air. Tiny umbilical hernia containing fat. Musculoskeletal: No acute abnormality. IMPRESSION: 1. No acute abnormality in the abdomen or pelvis. 2. Colonic diverticulosis without acute bowel inflammation. Electronically Signed   By: Markus Daft M.D.   On: 11/06/2019 15:05       Assessment & Plan:   Problem List Items Addressed This Visit    None       Einar Pheasant, MD

## 2020-05-21 ENCOUNTER — Encounter: Payer: Self-pay | Admitting: Internal Medicine

## 2020-05-21 LAB — COVID-19, FLU A+B AND RSV
Influenza A, NAA: NOT DETECTED
Influenza B, NAA: NOT DETECTED
RSV, NAA: NOT DETECTED
SARS-CoV-2, NAA: NOT DETECTED

## 2020-05-21 NOTE — Telephone Encounter (Signed)
If worsening symptoms , wheezing, sob - needs to be seen.  Just need more specifics about what symptoms she is having.  What is she taking now?  Any chest pain, sob or chest tightness?

## 2020-05-21 NOTE — Telephone Encounter (Signed)
Patient stated she is not worse. Feeling some better. She has not used her inhaler that was sent in earlier in the week so she is going to start. She was advised to monitor symptoms and send update on Monday. Confirmed no shortness of breath. She is using mucinex and flonase. No chest tightness.

## 2020-05-22 ENCOUNTER — Encounter: Payer: Self-pay | Admitting: Internal Medicine

## 2020-05-22 NOTE — Progress Notes (Signed)
Patient ID: Lori Guerrero, female   DOB: January 25, 1951, 69 y.o.   MRN: [id number]   Virtual Visit via telephone Note  This visit type was conducted due to national recommendations for restrictions regarding the COVID-19 pandemic (e.g. social distancing).  This format is felt to be most appropriate for this patient at this time.  All issues noted in this document were discussed and addressed.  No physical exam was performed (except for noted visual exam findings with Video Visits).   I connected with Lori Guerrero by telephone and verified that I am speaking with the correct person using two identifiers. Location patient: home Location provider: work Persons participating in the telephone visit: patient, provider  The limitations, risks, security and privacy concerns of performing an evaluation and management service by telephone and the availability of in person appointments have been discussed.  It has also been discussed with the patient that there may be a patient responsible charge related to this service. The patient expressed understanding and agreed to proceed.   Reason for visit: work in appt  HPI: Work in for [employer].  States symptoms started approximately 5 days ago.  Increased head congestion.  No fever.  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  Lost her voice initially.  Then developed some deep cough and some wheezing.  No sob.  No chest pain or chest tightness.  Mucus - mostly clear.  One time noticed some yellow mucus.  No sore throat.  Eating.  No nausea.  Has taken a couple of zyrtec.  Just started flonase.  Has not been around anyone she is aware of who tested positive for covid.     ROS: See pertinent positives and negatives per HPI.  Past Medical History:  Diagnosis Date   GERD (gastroesophageal reflux disease)    Hyperglycemia    Hyperplastic colon polyp    Skin cancer    Vitamin D deficiency     Past Surgical History:  Procedure Laterality Date   BREAST  BIOPSY     25 years ago   DEBRIDEMENT TENNIS ELBOW     MOHS SURGERY     skin cancer    Family History  Problem Relation Age of Onset   Heart disease Mother    Cancer Paternal Grandfather        colon   Cancer Paternal Aunt        Breast cancer   Breast cancer Paternal Aunt 39   Breast cancer Maternal Aunt    Diabetes Maternal Grandmother     SOCIAL HX: reviewed.    Current Outpatient Medications:    albuterol (VENTOLIN HFA) 108 (90 Base) MCG/ACT inhaler, Inhale 2 puffs into the lungs every 6 (six) hours as needed for wheezing or shortness of breath., Disp: 18 g, Rfl: 0   ALPRAZolam (XANAX) 0.25 MG tablet, Take 1 tablet (0.25 mg total) by mouth daily as needed for anxiety., Disp: 20 tablet, Rfl: 0   Multiple Vitamins-Minerals (MULTIVITAMIN PO), Take by mouth daily., Disp: , Rfl:    mupirocin ointment (BACTROBAN) 2 %, Apply to affected area bid, Disp: 22 g, Rfl: 0   nystatin cream (MYCOSTATIN), Apply 1 application topically 2 (two) times daily., Disp: 30 g, Rfl: 0   omeprazole (PRILOSEC) 20 MG capsule, TAKE 1 CAPSULE DAILY, Disp: 90 capsule, Rfl: 4  EXAM:  GENERAL: alert.  Sounds to be in no acute distress.  Answering questions appropriately.  No sob with talking.    PSYCH/NEURO: pleasant and cooperative, no obvious depression  or anxiety, speech and thought processing grossly intact  ASSESSMENT AND PLAN:  Discussed the following assessment and plan:  Problem List Items Addressed This Visit    Cough    Cough and congestion as outlined.  Has not around anyone who has tested positive for covid (that she is aware of).  Has had covid vaccine.  No chest pain or sob.  Some increased congestion, cough and minimal wheezing.  Will obtain swab for RSV, covid and flu.  Treat with flonase nasal spray and saline nasal spray as directed.  mucinex as directed.  Albuterol inhaler to have if needed.  Follow symptoms closely.  Call with update.  Any worsening symptoms, will need  to be evaluated.        Relevant Orders   COVID-19, Flu A+B and RSV (Completed)       I discussed the assessment and treatment plan with the patient. The patient was provided an opportunity to ask questions and all were answered. The patient agreed with the plan and demonstrated an understanding of the instructions.   The patient was advised to call back or seek an in-person evaluation if the symptoms worsen or if the condition fails to improve as anticipated.  I provided 23 minutes of non-face-to-face time during this encounter.   Einar Pheasant, MD

## 2020-05-22 NOTE — Assessment & Plan Note (Signed)
Cough and congestion as outlined.  Has not around anyone who has tested positive for covid (that she is aware of).  Has had covid vaccine.  No chest pain or sob.  Some increased congestion, cough and minimal wheezing.  Will obtain swab for RSV, covid and flu.  Treat with flonase nasal spray and saline nasal spray as directed.  mucinex as directed.  Albuterol inhaler to have if needed.  Follow symptoms closely.  Call with update.  Any worsening symptoms, will need to be evaluated.

## 2020-05-22 NOTE — Telephone Encounter (Signed)
Agree with using the inhaler, etc.  Agree with if any worsening symptoms needs to be evaluated.  Please confirm doing better - f/u.  Thanks.

## 2020-05-24 NOTE — Telephone Encounter (Signed)
LMTCB

## 2020-05-26 NOTE — Telephone Encounter (Signed)
LMTCB

## 2020-06-01 NOTE — Telephone Encounter (Signed)
Left detailed message for patient to call if not better or if she needs anything.

## 2020-06-10 ENCOUNTER — Other Ambulatory Visit: Payer: Self-pay | Admitting: Internal Medicine

## 2020-06-23 ENCOUNTER — Other Ambulatory Visit: Payer: Self-pay

## 2020-06-23 ENCOUNTER — Ambulatory Visit
Admission: RE | Admit: 2020-06-23 | Discharge: 2020-06-23 | Disposition: A | Discharge status: Home / Self Care | Payer: 59 | Source: Ambulatory Visit | Attending: Internal Medicine | Admitting: Internal Medicine

## 2020-06-23 DIAGNOSIS — Z1231 Encounter for screening mammogram for malignant neoplasm of breast: Secondary | ICD-10-CM | POA: Diagnosis not present

## 2020-06-29 ENCOUNTER — Encounter: Payer: Self-pay | Admitting: Internal Medicine

## 2020-06-29 ENCOUNTER — Other Ambulatory Visit: Payer: Self-pay | Admitting: Internal Medicine

## 2020-06-29 DIAGNOSIS — F439 Reaction to severe stress, unspecified: Secondary | ICD-10-CM

## 2020-06-29 DIAGNOSIS — K219 Gastro-esophageal reflux disease without esophagitis: Secondary | ICD-10-CM

## 2020-06-29 MED ORDER — ALPRAZOLAM 0.25 MG PO TABS: 0.2500 mg | ORAL_TABLET | Freq: Every day | ORAL | 0 refills | Status: DC | PRN

## 2020-06-29 MED ORDER — OMEPRAZOLE 20 MG PO CPDR: 20.0000 mg | DELAYED_RELEASE_CAPSULE | Freq: Every day | ORAL | 1 refills | Status: DC

## 2020-07-01 ENCOUNTER — Encounter: Payer: Self-pay | Admitting: Internal Medicine

## 2020-07-28 ENCOUNTER — Encounter: Payer: Self-pay | Admitting: *Deleted

## 2020-07-29 ENCOUNTER — Encounter: Payer: Self-pay | Admitting: Internal Medicine

## 2020-07-30 NOTE — Telephone Encounter (Signed)
Here is the phone number for Ms Kleckner's daughter.  Thanks.

## 2020-07-30 NOTE — Telephone Encounter (Signed)
Ok to schedule as a new pt.

## 2020-07-30 NOTE — Telephone Encounter (Signed)
Called and schedule daughter a New Pt appt

## 2020-12-03 ENCOUNTER — Telehealth: Payer: Self-pay | Admitting: Internal Medicine

## 2020-12-03 ENCOUNTER — Encounter: Payer: Self-pay | Admitting: Internal Medicine

## 2020-12-03 ENCOUNTER — Ambulatory Visit: Admit: 2020-12-03 | Discharge status: Against Medical Advice | Payer: 59

## 2020-12-03 NOTE — Telephone Encounter (Signed)
Called pt to get more information. Pt is going to [facility].

## 2020-12-03 NOTE — Telephone Encounter (Signed)
Pt called back requesting to speak directly with me because she wanted an appt with some one with in [place]. Advised with it being Friday she needed to go ahead and be seen because the antiviral treatment is time sensitive. Checked with all [place] offices and no appts available. Patient has been scheduled to go to urgent care.

## 2020-12-03 NOTE — Telephone Encounter (Signed)
Called and spoke to Thayne. Lori Guerrero stated that she went to [facility] clinic and was evaluated today. She received 3 medications, Tessalon, Zithromax, and Deltasone.She was told that she has a spot on her lungs and that she needs to review her recent Chest Xray with PCP to understand what the spot is. Patient asks if recent chest xrays show the spot on her lungs.

## 2020-12-03 NOTE — Telephone Encounter (Signed)
Deltasone is a steroid.  They also gave her abx and zpak.  Treating her acute symptoms.  I need a copy of her cxr report asap to review.  The cxr's do not show up in care everywhere.  Please call [facility] acute care and obtain copy of cxr for me to review.  Also, let her know we are requesting cxr and to let us know if does not get better or if needs anything.

## 2020-12-03 NOTE — Telephone Encounter (Signed)
Pt tested positive for Covid today  She is currently on a steroid do to a spot on her lungs  She said that she did have a sore throat but since starting the steroid it has resolved. She wanted to know if Dr.Scott would recommend her taking the [facility]

## 2020-12-04 NOTE — Telephone Encounter (Signed)
Reviewed chart and recent visit to acute care.  Also spoke to [place].  Per discussion, Lori Guerrero is doing well. No acute issues.  Per [facility] visit, was given zpak, prednisone.  She is supposed to request cxr.  If do not receive, please find out where she was evaluated and contact them to send records and xray report.  Please hold until get results.

## 2020-12-05 ENCOUNTER — Encounter: Payer: Self-pay | Admitting: Internal Medicine

## 2020-12-06 NOTE — Telephone Encounter (Signed)
Please call Lori Guerrero and confirm she is doing ok.  Any cough or congestion?  Any sob?  Reviewed cxr.  I can schedule a CT chest to further evaluation.  No nodule.  Changes may be c/w recent issues (exposure and infection).  Let me know if desires chest CT

## 2020-12-06 NOTE — Telephone Encounter (Signed)
Cxr results sent to Dr Nicki Reaper

## 2020-12-07 NOTE — Telephone Encounter (Signed)
No cough. A little bit of congestion. Nothing significant. Seems to be doing ok. She says she is doing pretty well. She sent the cxr report to the [facility] and he did not feel CT was necessary. She may decide to have a chest CT done later on down the road. She has not seen you since 04/2020 in the office so I scheduled her for a follow up in July and made a note discuss having chest CT done then per her request. She does not want one right now.

## 2020-12-07 NOTE — Telephone Encounter (Signed)
Noted.  Will plan to discuss at appt.

## 2020-12-27 ENCOUNTER — Other Ambulatory Visit: Payer: Self-pay | Admitting: Internal Medicine

## 2020-12-27 DIAGNOSIS — K219 Gastro-esophageal reflux disease without esophagitis: Secondary | ICD-10-CM

## 2020-12-27 NOTE — Telephone Encounter (Signed)
Please advise, would you like for the Patient to stay on this medication?

## 2021-01-18 ENCOUNTER — Ambulatory Visit (INDEPENDENT_AMBULATORY_CARE_PROVIDER_SITE_OTHER): Payer: 59 | Admitting: Internal Medicine

## 2021-01-18 ENCOUNTER — Encounter: Payer: Self-pay | Admitting: Internal Medicine

## 2021-01-18 ENCOUNTER — Ambulatory Visit (INDEPENDENT_AMBULATORY_CARE_PROVIDER_SITE_OTHER): Payer: 59

## 2021-01-18 ENCOUNTER — Other Ambulatory Visit: Payer: Self-pay

## 2021-01-18 DIAGNOSIS — R739 Hyperglycemia, unspecified: Secondary | ICD-10-CM | POA: Diagnosis not present

## 2021-01-18 DIAGNOSIS — K219 Gastro-esophageal reflux disease without esophagitis: Secondary | ICD-10-CM | POA: Diagnosis not present

## 2021-01-18 DIAGNOSIS — L989 Disorder of the skin and subcutaneous tissue, unspecified: Secondary | ICD-10-CM | POA: Diagnosis not present

## 2021-01-18 DIAGNOSIS — Z86010 Personal history of colonic polyps: Secondary | ICD-10-CM

## 2021-01-18 DIAGNOSIS — F439 Reaction to severe stress, unspecified: Secondary | ICD-10-CM

## 2021-01-18 DIAGNOSIS — R9389 Abnormal findings on diagnostic imaging of other specified body structures: Secondary | ICD-10-CM | POA: Insufficient documentation

## 2021-01-18 DIAGNOSIS — R059 Cough, unspecified: Secondary | ICD-10-CM

## 2021-01-18 NOTE — Patient Instructions (Addendum)
Pepcid 20mg  - take one tablet 30 minutes before your evening meal.    When you get home, clarify that you are taking omeprazole 40mg  30 minutes before breakfast.

## 2021-01-18 NOTE — Progress Notes (Signed)
Patient ID: Lori Guerrero, female   DOB: 1951/06/30, 70 y.o.   MRN: [id number]   Subjective:    Patient ID: Lori Guerrero, female    DOB: 1950-11-12, 71 y.o.   MRN: [id number]  HPI This visit occurred during the SARS-CoV-2 public health emergency.  Safety protocols were in place, including screening questions prior to the visit, additional usage of staff PPE, and extensive cleaning of exam room while observing appropriate contact time as indicated for disinfecting solutions.   Patient here for a scheduled follow up.  Here to follow up regarding acid reflux.  Also, history of covid.  Reports persistent cough.  Breathing overall stable.  No chest pain or increased sob reported.  No abdominal pain.  Bowels moving.  Has noticed some occasional problems swallowing.  Taking prilosec.  Will clarify dose taking when she gets home.  No vomiting.  Handling stress.  Does report right ear lesion, right leg lesion.  Request referral to dermatology.     Past Medical History:  Diagnosis Date   GERD (gastroesophageal reflux disease)    Hyperglycemia    Hyperplastic colon polyp    Skin cancer    Vitamin D deficiency    Past Surgical History:  Procedure Laterality Date   BREAST BIOPSY     25 years ago   DEBRIDEMENT TENNIS ELBOW     MOHS SURGERY     skin cancer   Family History  Problem Relation Age of Onset   Heart disease Mother    Cancer Paternal Grandfather        colon   Cancer Paternal Aunt        Breast cancer   Breast cancer Paternal Aunt 37   Breast cancer Maternal Aunt    Diabetes Maternal Grandmother    Social History   Socioeconomic History   Marital status: Widowed    Spouse name: Not on file   Number of children: 1   Years of education: Not on file   Highest education level: Not on file  Occupational History   Not on file  Tobacco Use   Smoking status: Never   Smokeless tobacco: Never  Vaping Use   Vaping Use: Never used  Substance and Sexual Activity   Alcohol use:  Yes    Alcohol/week: 0.0 standard drinks    Comment: occassionally   Drug use: No   Sexual activity: Not Currently    Comment: 1st intercourse- 19, partners- 2, widow   Other Topics Concern   Not on file  Social History Narrative   Not on file   Social Determinants of Health   Financial Resource Strain: Not on file  Food Insecurity: Not on file  Transportation Needs: Not on file  Physical Activity: Not on file  Stress: Not on file  Social Connections: Not on file    Review of Systems  Constitutional:  Negative for appetite change and unexpected weight change.  HENT:  Negative for congestion and sinus pressure.   Respiratory:  Negative for chest tightness and shortness of breath.        Some persistent cough.   Cardiovascular:  Negative for chest pain, palpitations and leg swelling.  Gastrointestinal:  Negative for abdominal pain, diarrhea, nausea and vomiting.  Genitourinary:  Negative for difficulty urinating and dysuria.  Musculoskeletal:  Negative for joint swelling and myalgias.  Skin:  Negative for color change and rash.  Neurological:  Negative for dizziness, light-headedness and headaches.  Psychiatric/Behavioral:  Negative for agitation and dysphoric  mood.       Objective:    Physical Exam Vitals reviewed.  Constitutional:      General: She is not in acute distress.    Appearance: Normal appearance.  HENT:     Head: Normocephalic and atraumatic.     Right Ear: External ear normal.     Left Ear: External ear normal.  Eyes:     General: No scleral icterus.       Right eye: No discharge.        Left eye: No discharge.     Conjunctiva/sclera: Conjunctivae normal.  Neck:     Thyroid: No thyromegaly.  Cardiovascular:     Rate and Rhythm: Normal rate and regular rhythm.  Pulmonary:     Effort: No respiratory distress.     Breath sounds: Normal breath sounds. No wheezing.  Abdominal:     General: Bowel sounds are normal.     Palpations: Abdomen is soft.      Tenderness: There is no abdominal tenderness.  Musculoskeletal:        General: No swelling or tenderness.     Cervical back: Neck supple. No tenderness.  Lymphadenopathy:     Cervical: No cervical adenopathy.  Skin:    Findings: No erythema or rash.     Comments: Right arm and right lateral leg lesion.    Neurological:     Mental Status: She is alert.  Psychiatric:        Mood and Affect: Mood normal.        Behavior: Behavior normal.    BP 122/78   Pulse 73   Temp 98.1 F (36.7 C)   Ht 5' 2.99" (1.6 m)   Wt 178 lb 9.6 oz (81 kg)   SpO2 97%   BMI 31.65 kg/m  Wt Readings from Last 3 Encounters:  01/18/21 178 lb 9.6 oz (81 kg)  05/18/20 178 lb (80.7 kg)  04/19/20 178 lb 6.4 oz (80.9 kg)    Outpatient Encounter Medications as of 01/18/2021  Medication Sig   Multiple Vitamins-Minerals (MULTIVITAMIN PO) Take by mouth daily.   [DISCONTINUED] omeprazole (PRILOSEC) 20 MG capsule TAKE 1 CAPSULE DAILY 30 MINUTES BEFORE FOOD   [DISCONTINUED] albuterol (VENTOLIN HFA) 108 (90 Base) MCG/ACT inhaler TAKE 2 PUFFS BY MOUTH EVERY 6 HOURS AS NEEDED FOR WHEEZE OR SHORTNESS OF BREATH   [DISCONTINUED] ALPRAZolam (XANAX) 0.25 MG tablet Take 1 tablet (0.25 mg total) by mouth daily as needed for anxiety. Further refills PCP Dr. Nicki Reaper   [DISCONTINUED] mupirocin ointment (BACTROBAN) 2 % Apply to affected area bid   [DISCONTINUED] nystatin cream (MYCOSTATIN) Apply 1 application topically 2 (two) times daily.   No facility-administered encounter medications on file as of 01/18/2021.     Lab Results  Component Value Date   WBC 4.5 04/19/2020   HGB 13.8 04/19/2020   HCT 40.1 04/19/2020   PLT 220.0 04/19/2020   GLUCOSE 105 (H) 04/19/2020   CHOL 191 04/19/2020   TRIG 81.0 04/19/2020   HDL 56.80 04/19/2020   LDLCALC 118 (H) 04/19/2020   ALT 18 04/19/2020   AST 21 04/19/2020   NA 139 04/19/2020   K 4.3 04/19/2020   CL 105 04/19/2020   CREATININE 0.84 04/19/2020   BUN 19 04/19/2020   CO2 26  04/19/2020   TSH 2.29 04/19/2020   HGBA1C 6.1 04/19/2020   MICROALBUR <0.7 02/07/2018    MM 3D SCREEN BREAST BILATERAL  Result Date: 06/28/2020 CLINICAL DATA:  Screening. EXAM: DIGITAL SCREENING BILATERAL  MAMMOGRAM WITH TOMO AND CAD COMPARISON:  Previous exam(s). ACR Breast Density Category b: There are scattered areas of fibroglandular density. FINDINGS: There are no findings suspicious for malignancy. Images were processed with CAD. IMPRESSION: No mammographic evidence of malignancy. A result letter of this screening mammogram will be mailed directly to the patient. RECOMMENDATION: Screening mammogram in one year. (Code:SM-B-01Y) BI-RADS CATEGORY  1: Negative. Electronically Signed   By: Lajean Manes M.D.   On: 06/28/2020 09:51       Assessment & Plan:   Problem List Items Addressed This Visit     Abnormal CXR   Relevant Orders   DG Chest 2 View (Completed)   Cough    History of covid.  Persistent cough.  Check cxr.  Treat acid reflux.  Further w/up pending cxr results.         GERD (gastroesophageal reflux disease)    On prilosec.  Describes occasional problems swallowing.  Will confrim taking prilosec 43m q am.  Add pepcid 213mbefore evening meal.  Follow.  Notify me if persistent issues.  Will need further evaluation if persistent.        History of colonic polyps    Had f/u colonoscopy 10/2019.        Hyperglycemia    Low carb diet and exercise.  Follow met b and a1c.        Relevant Orders   Hemoglobin A1c   Lipid panel   Hepatic function panel   Basic metabolic panel   Skin lesions    Skin lesion - right ear, right lateral leg and left breast.  Request dermatology referral.         Relevant Orders   Ambulatory referral to Dermatology   Stress    Overall appears to be handling things well.  Follow.          ChEinar PheasantMD

## 2021-01-19 ENCOUNTER — Telehealth: Payer: Self-pay | Admitting: Internal Medicine

## 2021-01-19 NOTE — Telephone Encounter (Signed)
Patient would like Larena Glassman to call her about her MyChart note.

## 2021-01-19 NOTE — Telephone Encounter (Signed)
Left a message to call back.

## 2021-01-19 NOTE — Telephone Encounter (Signed)
Where does she want to go and have scan performed?  Will she do the scan through [facility] - outpatient.  Need to know where she will go.

## 2021-01-19 NOTE — Telephone Encounter (Signed)
Lori Guerrero spoke to patient today about setting up a scan, per patient. She does not want to go to the hospital to have the scan done.

## 2021-01-21 ENCOUNTER — Other Ambulatory Visit: Payer: Self-pay | Admitting: Internal Medicine

## 2021-01-21 ENCOUNTER — Other Ambulatory Visit: Payer: Self-pay

## 2021-01-21 DIAGNOSIS — R9389 Abnormal findings on diagnostic imaging of other specified body structures: Secondary | ICD-10-CM

## 2021-01-21 MED ORDER — OMEPRAZOLE 40 MG PO CPDR: 40.0000 mg | DELAYED_RELEASE_CAPSULE | Freq: Every day | ORAL | 3 refills | Status: DC

## 2021-01-21 MED ORDER — FAMOTIDINE 20 MG PO TABS: 20.0000 mg | ORAL_TABLET | Freq: Every day | ORAL | 3 refills | Status: DC

## 2021-01-21 NOTE — Telephone Encounter (Signed)
I have ordered a chest CT to be done at [place] (see result note).  I am not sure where her insurance covers (better).  She will need to check with her insurance and see how much this will cost her at [facility] and if they have a preferred site to be performed.

## 2021-01-21 NOTE — Telephone Encounter (Signed)
Pt is aware of below and stated that they told her that her insurance is covering.

## 2021-01-21 NOTE — Progress Notes (Signed)
Chest CT ordered

## 2021-01-21 NOTE — Telephone Encounter (Signed)
Clarified with patient. Stated that she does not need a prescription for the omeprazole right now. Sent in new script for the famotidine. Updated med list. Advised to call if any questions.

## 2021-01-30 ENCOUNTER — Encounter: Payer: Self-pay | Admitting: Internal Medicine

## 2021-01-30 NOTE — Assessment & Plan Note (Signed)
History of covid.  Persistent cough.  Check cxr.  Treat acid reflux.  Further w/up pending cxr results.

## 2021-01-30 NOTE — Assessment & Plan Note (Signed)
Low carb diet and exercise.  Follow met b and a1c.  

## 2021-01-30 NOTE — Assessment & Plan Note (Signed)
Had f/u colonoscopy 10/2019.  

## 2021-01-30 NOTE — Assessment & Plan Note (Signed)
Overall appears to be handling things well.  Follow.  ?

## 2021-01-30 NOTE — Assessment & Plan Note (Signed)
Skin lesion - right ear, right lateral leg and left breast.  Request dermatology referral.

## 2021-01-30 NOTE — Assessment & Plan Note (Signed)
On prilosec.  Describes occasional problems swallowing.  Will confrim taking prilosec '40mg'$  q am.  Add pepcid '20mg'$  before evening meal.  Follow.  Notify me if persistent issues.  Will need further evaluation if persistent.

## 2021-02-01 ENCOUNTER — Telehealth: Payer: Self-pay | Admitting: Internal Medicine

## 2021-02-01 ENCOUNTER — Encounter: Payer: Self-pay | Admitting: Internal Medicine

## 2021-02-01 NOTE — Telephone Encounter (Signed)
Please notify Lori Guerrero that she will need a visit to be covid tested here.  Can schedule a virtual visit tomorrow am at 10:00.  See me if questions.

## 2021-02-01 NOTE — Telephone Encounter (Signed)
Placed call to pt. Pt is scheduled for 10 am tomorrow. Pt will take an at home test and pt states she will let us know if she wants to keep appt or cancel depending on result of test.

## 2021-02-02 ENCOUNTER — Telehealth: Payer: 59 | Admitting: Internal Medicine

## 2021-02-03 ENCOUNTER — Other Ambulatory Visit: Payer: 59

## 2021-02-05 ENCOUNTER — Ambulatory Visit
Admission: RE | Admit: 2021-02-05 | Discharge: 2021-02-05 | Disposition: A | Discharge status: Home / Self Care | Payer: 59 | Source: Ambulatory Visit | Attending: Internal Medicine | Admitting: Internal Medicine

## 2021-02-05 ENCOUNTER — Other Ambulatory Visit: Payer: Self-pay

## 2021-02-05 DIAGNOSIS — R9389 Abnormal findings on diagnostic imaging of other specified body structures: Secondary | ICD-10-CM

## 2021-02-05 NOTE — Telephone Encounter (Signed)
She has been notified will need to reschedule labs.  Regarding a1c, I do not see in chart, will do a1c with upcoming labs.

## 2021-02-09 ENCOUNTER — Other Ambulatory Visit: Payer: 59

## 2021-02-10 NOTE — Telephone Encounter (Signed)
See result note.  

## 2021-02-15 ENCOUNTER — Telehealth: Payer: Self-pay | Admitting: Internal Medicine

## 2021-02-15 NOTE — Telephone Encounter (Signed)
Rejection Reason - Patient Declined" Lori Guerrero said on Feb 15, 2021 9:59 AM  Msg from [facility]

## 2021-04-13 ENCOUNTER — Other Ambulatory Visit (INDEPENDENT_AMBULATORY_CARE_PROVIDER_SITE_OTHER): Payer: 59

## 2021-04-13 ENCOUNTER — Telehealth: Payer: Self-pay | Admitting: Internal Medicine

## 2021-04-13 ENCOUNTER — Encounter: Payer: Self-pay | Admitting: Internal Medicine

## 2021-04-13 ENCOUNTER — Other Ambulatory Visit: Payer: Self-pay

## 2021-04-13 DIAGNOSIS — R300 Dysuria: Secondary | ICD-10-CM

## 2021-04-13 DIAGNOSIS — R739 Hyperglycemia, unspecified: Secondary | ICD-10-CM

## 2021-04-13 LAB — LIPID PANEL
Cholesterol: 188 mg/dL (ref 0–200)
HDL: 51.1 mg/dL (ref >39.00)
LDL Cholesterol: 115 mg/dL — ABNORMAL HIGH (ref 0–99)
NonHDL: 136.94
Total CHOL/HDL Ratio: 4
Triglycerides: 109 mg/dL (ref 0.0–149.0)
VLDL: 21.8 mg/dL (ref 0.0–40.0)

## 2021-04-13 LAB — HEPATIC FUNCTION PANEL
ALT: 20 U/L (ref 0–35)
AST: 22 U/L (ref 0–37)
Albumin: 4 g/dL (ref 3.5–5.2)
Alkaline Phosphatase: 109 U/L (ref 39–117)
Bilirubin, Direct: 0.1 mg/dL (ref 0.0–0.3)
Total Bilirubin: 0.4 mg/dL (ref 0.2–1.2)
Total Protein: 6.3 g/dL (ref 6.0–8.3)

## 2021-04-13 LAB — BASIC METABOLIC PANEL
BUN: 18 mg/dL (ref 6–23)
CO2: 24 mEq/L (ref 19–32)
Calcium: 9.5 mg/dL (ref 8.4–10.5)
Chloride: 107 mEq/L (ref 96–112)
Creatinine, Ser: 0.88 mg/dL (ref 0.40–1.20)
GFR: 67.19 mL/min (ref >60.00)
Glucose, Bld: 116 mg/dL — ABNORMAL HIGH (ref 70–99)
Potassium: 4.1 mEq/L (ref 3.5–5.1)
Sodium: 141 mEq/L (ref 135–145)

## 2021-04-13 LAB — HEMOGLOBIN A1C: Hgb A1c MFr Bld: 5.8 % (ref 4.6–6.5)

## 2021-04-13 NOTE — Addendum Note (Signed)
Addended by: Neta Ehlers on: 04/13/2021 03:42 PM   Modules accepted: Orders

## 2021-04-13 NOTE — Telephone Encounter (Signed)
Pt had lab appt today. Pt also stated she was having urine issues and left a urine sample. Pt stated she would contact Dr. Nicki Reaper in [place] today about her urine concerns.

## 2021-04-13 NOTE — Telephone Encounter (Signed)
I am ok to check a urine, but need to know if having issues.  (Need a diagnosis and if concern about infection, may need appt. )

## 2021-04-13 NOTE — Telephone Encounter (Signed)
See my chart message.  I am ok with checking a urine, but need to know what issues she is having (need diagnosis).  Also, if she is having acute issues and concerned regarding possible infection - my need to be seen.

## 2021-04-13 NOTE — Telephone Encounter (Signed)
Urine order placed. Confirmed no acute issues. Has appt on 10/18.

## 2021-04-13 NOTE — Addendum Note (Signed)
Addended by: Lars Masson on: 04/13/2021 03:29 PM   Modules accepted: Orders

## 2021-04-14 LAB — URINALYSIS, ROUTINE W REFLEX MICROSCOPIC
Bilirubin Urine: NEGATIVE
Hgb urine dipstick: NEGATIVE
Ketones, ur: NEGATIVE
Nitrite: NEGATIVE
RBC / HPF: NONE SEEN (ref 0–?)
Specific Gravity, Urine: 1.025 (ref 1.000–1.030)
Total Protein, Urine: NEGATIVE
Urine Glucose: NEGATIVE
Urobilinogen, UA: 0.2 (ref 0.0–1.0)
pH: 6 (ref 5.0–8.0)

## 2021-04-19 ENCOUNTER — Ambulatory Visit (INDEPENDENT_AMBULATORY_CARE_PROVIDER_SITE_OTHER): Payer: 59 | Admitting: Internal Medicine

## 2021-04-19 ENCOUNTER — Other Ambulatory Visit: Payer: Self-pay

## 2021-04-19 DIAGNOSIS — Z86010 Personal history of colonic polyps: Secondary | ICD-10-CM | POA: Diagnosis not present

## 2021-04-19 DIAGNOSIS — R739 Hyperglycemia, unspecified: Secondary | ICD-10-CM | POA: Diagnosis not present

## 2021-04-19 DIAGNOSIS — K219 Gastro-esophageal reflux disease without esophagitis: Secondary | ICD-10-CM

## 2021-04-19 DIAGNOSIS — F439 Reaction to severe stress, unspecified: Secondary | ICD-10-CM | POA: Diagnosis not present

## 2021-04-19 DIAGNOSIS — M79671 Pain in right foot: Secondary | ICD-10-CM

## 2021-04-19 DIAGNOSIS — R059 Cough, unspecified: Secondary | ICD-10-CM

## 2021-04-19 NOTE — Assessment & Plan Note (Addendum)
The 10-year ASCVD risk score (Arnett DK, et al., 2019) is: 8.5%   Values used to calculate the score:     Age: 70 years     Sex: Female     Is Non-Hispanic African American: No     Diabetic: No     Tobacco smoker: No     Systolic Blood Pressure: 183 mmHg     Is BP treated: No     HDL Cholesterol: 51.1 mg/dL     Total Cholesterol: 188 mg/dL  Discussed calculated cholesterol risk.  Declines cholesterol medication.  Low cholesterol diet and exercise.  Follow lipid panel. Also low carb diet and exercise.  We will follow.

## 2021-04-19 NOTE — Progress Notes (Signed)
Patient ID: Lori Guerrero, female   DOB: Jun 10, 1951, 70 y.o.   MRN: [id number]   Subjective:    Patient ID: Lori Guerrero, female    DOB: February 10, 1951, 70 y.o.   MRN: [id number]  This visit occurred during the SARS-CoV-2 public health emergency.  Safety protocols were in place, including screening questions prior to the visit, additional usage of staff PPE, and extensive cleaning of exam room while observing appropriate contact time as indicated for disinfecting solutions.   Patient here for a scheduled follow up.    HPI Here to follow up regarding her history of acid reflux.  Also recently has covid.  No significant residual problems from covid.  Had cough.  Reports to me that this has resolved.  Taking prilosec for acid reflux.  Appears to be controlled.  No chest pain or sob reported.  Does report increased stress.  Job stress.  Feels overwhelmed.  Discussed.  Does not feel needs any further intervention at this time.  No abdominal pain.  Bowels moving.  Does report persistent pain - right lateral foot/ankle.  Some increased pressure. Request referral.     Past Medical History:  Diagnosis Date   GERD (gastroesophageal reflux disease)    Hyperglycemia    Hyperplastic colon polyp    Skin cancer    Vitamin D deficiency    Past Surgical History:  Procedure Laterality Date   BREAST BIOPSY     25 years ago   [place]     skin cancer   Family History  Problem Relation Age of Onset   Heart disease Mother    Cancer Paternal Grandfather        colon   Cancer Paternal Aunt        Breast cancer   Breast cancer Paternal Aunt 5   Breast cancer Maternal Aunt    Diabetes Maternal Grandmother    Social History   Socioeconomic History   Marital status: Widowed    Spouse name: Not on file   Number of children: 1   Years of education: Not on file   Highest education level: Not on file  Occupational History   Not on file  Tobacco Use   Smoking status:  Never   Smokeless tobacco: Never  Vaping Use   Vaping Use: Never used  Substance and Sexual Activity   Alcohol use: Yes    Alcohol/week: 0.0 standard drinks    Comment: occassionally   Drug use: No   Sexual activity: Not Currently    Comment: 1st intercourse- 19, partners- 2, widow   Other Topics Concern   Not on file  Social History Narrative   Not on file   Social Determinants of Health   Financial Resource Strain: Not on file  Food Insecurity: Not on file  Transportation Needs: Not on file  Physical Activity: Not on file  Stress: Not on file  Social Connections: Not on file     Review of Systems  Constitutional:  Negative for appetite change and unexpected weight change.  HENT:  Negative for congestion and sinus pressure.   Respiratory:  Negative for chest tightness and shortness of breath.        Reported cough resolved.   Cardiovascular:  Negative for chest pain, palpitations and leg swelling.  Gastrointestinal:  Negative for abdominal pain, diarrhea, nausea and vomiting.  Genitourinary:  Negative for difficulty urinating and dysuria.  Musculoskeletal:  Negative for myalgias.  Right foot pain as outlined.    Skin:  Negative for color change and rash.  Neurological:  Negative for dizziness, light-headedness and headaches.  Psychiatric/Behavioral:  Negative for agitation and dysphoric mood.       Objective:     BP 128/68   Pulse 72   Temp 97.6 F (36.4 C)   Resp 16   Ht 5\' 3"  (1.6 m)   Wt 177 lb 6.4 oz (80.5 kg)   SpO2 98%   BMI 31.42 kg/m  Wt Readings from Last 3 Encounters:  04/19/21 177 lb 6.4 oz (80.5 kg)  01/18/21 178 lb 9.6 oz (81 kg)  05/18/20 178 lb (80.7 kg)    Physical Exam Vitals reviewed.  Constitutional:      General: She is not in acute distress.    Appearance: Normal appearance.  HENT:     Head: Normocephalic and atraumatic.     Right Ear: External ear normal.     Left Ear: External ear normal.  Eyes:     General: No  scleral icterus.       Right eye: No discharge.        Left eye: No discharge.     Conjunctiva/sclera: Conjunctivae normal.  Neck:     Thyroid: No thyromegaly.  Cardiovascular:     Rate and Rhythm: Normal rate and regular rhythm.  Pulmonary:     Effort: No respiratory distress.     Breath sounds: Normal breath sounds. No wheezing.  Abdominal:     General: Bowel sounds are normal.     Palpations: Abdomen is soft.     Tenderness: There is no abdominal tenderness.  Musculoskeletal:        General: No swelling or tenderness.     Cervical back: Neck supple. No tenderness.  Lymphadenopathy:     Cervical: No cervical adenopathy.  Skin:    Findings: No erythema or rash.  Neurological:     Mental Status: She is alert.  Psychiatric:        Mood and Affect: Mood normal.        Behavior: Behavior normal.     Outpatient Encounter Medications as of 04/19/2021  Medication Sig   famotidine (PEPCID) 20 MG tablet Take 1 tablet (20 mg total) by mouth daily before supper.   Multiple Vitamins-Minerals (MULTIVITAMIN PO) Take by mouth daily.   [DISCONTINUED] omeprazole (PRILOSEC) 40 MG capsule Take 1 capsule (40 mg total) by mouth daily before breakfast.   No facility-administered encounter medications on file as of 04/19/2021.     Lab Results  Component Value Date   WBC 4.5 04/19/2020   HGB 13.8 04/19/2020   HCT 40.1 04/19/2020   PLT 220.0 04/19/2020   GLUCOSE 116 (H) 04/13/2021   CHOL 188 04/13/2021   TRIG 109.0 04/13/2021   HDL 51.10 04/13/2021   LDLCALC 115 (H) 04/13/2021   ALT 20 04/13/2021   AST 22 04/13/2021   NA 141 04/13/2021   K 4.1 04/13/2021   CL 107 04/13/2021   CREATININE 0.88 04/13/2021   BUN 18 04/13/2021   CO2 24 04/13/2021   TSH 2.29 04/19/2020   HGBA1C 5.8 04/13/2021   MICROALBUR <0.7 02/07/2018    CT Chest Wo Contrast  Result Date: 02/06/2021 CLINICAL DATA:  Possible infiltrate at the left base. EXAM: CT CHEST WITHOUT CONTRAST TECHNIQUE: Multidetector CT  imaging of the chest was performed following the standard protocol without IV contrast. COMPARISON:  Chest radiograph dated 01/18/2021. FINDINGS: Evaluation of this exam is limited  in the absence of intravenous contrast. Cardiovascular: There is no cardiomegaly or pericardial effusion. The thoracic aorta and central pulmonary arteries are grossly unremarkable on this noncontrast CT. Mediastinum/Nodes: No hilar or mediastinal adenopathy. The esophagus and the thyroid gland are grossly unremarkable as visualized. No mediastinal fluid collection. Lungs/Pleura: The lungs are clear. There is no pleural effusion or pneumothorax. The central airways patent. Upper Abdomen: No acute abnormality. Musculoskeletal: No acute osseous pathology. IMPRESSION: No acute intrathoracic pathology. Electronically Signed   By: Anner Crete M.D.   On: 02/06/2021 21:50       Assessment & Plan:   Problem List Items Addressed This Visit     Cough    Per discussion, cough has resolved.  Continue prilosec.  Recent CT chest - clear.        Foot pain, right    Persistent right foot/ankle pain.  Refer to podiatry.       Relevant Orders   Ambulatory referral to Podiatry   GERD (gastroesophageal reflux disease)    No increased acid reflux reported.  Continue prilosec.       Relevant Orders   CBC with Differential/Platelet   History of colonic polyps    Had f/u colonoscopy 10/2019.  Need report.       Hyperglycemia    The 10-year ASCVD risk score (Arnett DK, et al., 2019) is: 8.5%   Values used to calculate the score:     Age: 58 years     Sex: Female     Is Non-Hispanic African American: No     Diabetic: No     Tobacco smoker: No     Systolic Blood Pressure: 433 mmHg     Is BP treated: No     HDL Cholesterol: 51.1 mg/dL     Total Cholesterol: 188 mg/dL  Discussed calculated cholesterol risk.  Declines cholesterol medication.  Low cholesterol diet and exercise.  Follow lipid panel. Also low carb diet and  exercise.  We will follow.        Relevant Orders   Basic metabolic panel   Hemoglobin A1c   Hepatic function panel   Lipid panel   TSH   Stress    Increased stress as outlined.  Discussed.  Will notify me if feels needs any further intervention.         Einar Pheasant, MD

## 2021-04-30 ENCOUNTER — Encounter: Payer: Self-pay | Admitting: Internal Medicine

## 2021-05-02 ENCOUNTER — Other Ambulatory Visit: Payer: Self-pay

## 2021-05-02 ENCOUNTER — Encounter: Payer: Self-pay | Admitting: Internal Medicine

## 2021-05-02 DIAGNOSIS — M79671 Pain in right foot: Secondary | ICD-10-CM | POA: Insufficient documentation

## 2021-05-02 MED ORDER — OMEPRAZOLE 40 MG PO CPDR: 40.0000 mg | DELAYED_RELEASE_CAPSULE | Freq: Every day | ORAL | 3 refills | Status: DC

## 2021-05-02 NOTE — Assessment & Plan Note (Signed)
Had f/u colonoscopy 10/2019.  Need report.

## 2021-05-02 NOTE — Assessment & Plan Note (Signed)
Per discussion, cough has resolved.  Continue prilosec.  Recent CT chest - clear.

## 2021-05-02 NOTE — Assessment & Plan Note (Signed)
Increased stress as outlined.  Discussed.  Will notify me if feels needs any further intervention.   

## 2021-05-02 NOTE — Assessment & Plan Note (Signed)
Persistent right foot/ankle pain.  Refer to podiatry.

## 2021-05-02 NOTE — Assessment & Plan Note (Signed)
No increased acid reflux reported.  Continue prilosec.

## 2021-05-02 NOTE — Telephone Encounter (Signed)
Pt called in stating that she would like to have omeprazole (PRILOSEC) 40 MG capsule stopped. Pt said that [facility] keep refilling this medication and Pt doesn't need the meds anymore. Pt stated she have to much of the medication. Pt would like to have medication benzonatate 200mg  capsule refill. Pt would like Dr. Nicki Reaper to call her back

## 2021-05-20 ENCOUNTER — Other Ambulatory Visit: Payer: Self-pay | Admitting: Podiatry

## 2021-05-20 DIAGNOSIS — M76822 Posterior tibial tendinitis, left leg: Secondary | ICD-10-CM

## 2021-05-25 ENCOUNTER — Other Ambulatory Visit: Payer: Self-pay | Admitting: Internal Medicine

## 2021-05-25 DIAGNOSIS — Z1231 Encounter for screening mammogram for malignant neoplasm of breast: Secondary | ICD-10-CM

## 2021-06-06 ENCOUNTER — Other Ambulatory Visit: Payer: 59

## 2021-06-07 ENCOUNTER — Ambulatory Visit
Admission: RE | Admit: 2021-06-07 | Discharge: 2021-06-07 | Disposition: A | Discharge status: Home / Self Care | Payer: 59 | Source: Ambulatory Visit | Attending: Podiatry | Admitting: Podiatry

## 2021-06-07 ENCOUNTER — Other Ambulatory Visit: Payer: Self-pay

## 2021-06-07 DIAGNOSIS — M76822 Posterior tibial tendinitis, left leg: Secondary | ICD-10-CM | POA: Diagnosis present

## 2021-06-24 ENCOUNTER — Ambulatory Visit
Admission: RE | Admit: 2021-06-24 | Discharge: 2021-06-24 | Disposition: A | Discharge status: Home / Self Care | Payer: 59 | Source: Ambulatory Visit | Attending: Internal Medicine | Admitting: Internal Medicine

## 2021-06-24 ENCOUNTER — Other Ambulatory Visit: Payer: Self-pay

## 2021-06-24 DIAGNOSIS — Z1231 Encounter for screening mammogram for malignant neoplasm of breast: Secondary | ICD-10-CM | POA: Diagnosis present

## 2021-07-27 ENCOUNTER — Other Ambulatory Visit (INDEPENDENT_AMBULATORY_CARE_PROVIDER_SITE_OTHER): Payer: 59

## 2021-07-27 ENCOUNTER — Other Ambulatory Visit: Payer: Self-pay

## 2021-07-27 DIAGNOSIS — K219 Gastro-esophageal reflux disease without esophagitis: Secondary | ICD-10-CM | POA: Diagnosis not present

## 2021-07-27 DIAGNOSIS — R739 Hyperglycemia, unspecified: Secondary | ICD-10-CM | POA: Diagnosis not present

## 2021-07-27 LAB — LIPID PANEL
Cholesterol: 192 mg/dL (ref 0–200)
HDL: 57.4 mg/dL (ref >39.00)
LDL Cholesterol: 121 mg/dL — ABNORMAL HIGH (ref 0–99)
NonHDL: 135.02
Total CHOL/HDL Ratio: 3
Triglycerides: 68 mg/dL (ref 0.0–149.0)
VLDL: 13.6 mg/dL (ref 0.0–40.0)

## 2021-07-27 LAB — HEPATIC FUNCTION PANEL
ALT: 22 U/L (ref 0–35)
AST: 21 U/L (ref 0–37)
Albumin: 3.9 g/dL (ref 3.5–5.2)
Alkaline Phosphatase: 101 U/L (ref 39–117)
Bilirubin, Direct: 0.1 mg/dL (ref 0.0–0.3)
Total Bilirubin: 0.4 mg/dL (ref 0.2–1.2)
Total Protein: 6.4 g/dL (ref 6.0–8.3)

## 2021-07-27 LAB — HEMOGLOBIN A1C: Hgb A1c MFr Bld: 5.7 % (ref 4.6–6.5)

## 2021-07-27 LAB — BASIC METABOLIC PANEL
BUN: 18 mg/dL (ref 6–23)
CO2: 25 mEq/L (ref 19–32)
Calcium: 9.2 mg/dL (ref 8.4–10.5)
Chloride: 107 mEq/L (ref 96–112)
Creatinine, Ser: 0.89 mg/dL (ref 0.40–1.20)
GFR: 66.15 mL/min (ref >60.00)
Glucose, Bld: 112 mg/dL — ABNORMAL HIGH (ref 70–99)
Potassium: 4.1 mEq/L (ref 3.5–5.1)
Sodium: 141 mEq/L (ref 135–145)

## 2021-07-27 LAB — CBC WITH DIFFERENTIAL/PLATELET
Basophils Absolute: 0 10*3/uL (ref 0.0–0.1)
Basophils Relative: 1.1 % (ref 0.0–3.0)
Eosinophils Absolute: 0.1 10*3/uL (ref 0.0–0.7)
Eosinophils Relative: 3.1 % (ref 0.0–5.0)
HCT: 41.3 % (ref 36.0–46.0)
Hemoglobin: 14 g/dL (ref 12.0–15.0)
Lymphocytes Relative: 20.3 % (ref 12.0–46.0)
Lymphs Abs: 0.8 10*3/uL (ref 0.7–4.0)
MCHC: 34 g/dL (ref 30.0–36.0)
MCV: 93.6 fl (ref 78.0–100.0)
Monocytes Absolute: 0.2 10*3/uL (ref 0.1–1.0)
Monocytes Relative: 6.1 % (ref 3.0–12.0)
Neutro Abs: 2.6 10*3/uL (ref 1.4–7.7)
Neutrophils Relative %: 69.4 % (ref 43.0–77.0)
Platelets: 223 10*3/uL (ref 150.0–400.0)
RBC: 4.41 Mil/uL (ref 3.87–5.11)
RDW: 13.2 % (ref 11.5–15.5)
WBC: 3.8 10*3/uL — ABNORMAL LOW (ref 4.0–10.5)

## 2021-07-27 LAB — TSH: TSH: 2.11 u[IU]/mL (ref 0.35–5.50)

## 2021-07-28 ENCOUNTER — Telehealth: Payer: 59 | Admitting: Family Medicine

## 2021-07-28 ENCOUNTER — Encounter: Payer: 59 | Admitting: Internal Medicine

## 2021-07-28 ENCOUNTER — Telehealth (INDEPENDENT_AMBULATORY_CARE_PROVIDER_SITE_OTHER): Payer: 59 | Admitting: Internal Medicine

## 2021-07-28 DIAGNOSIS — K219 Gastro-esophageal reflux disease without esophagitis: Secondary | ICD-10-CM | POA: Diagnosis not present

## 2021-07-28 DIAGNOSIS — Z09 Encounter for follow-up examination after completed treatment for conditions other than malignant neoplasm: Secondary | ICD-10-CM

## 2021-07-28 DIAGNOSIS — R739 Hyperglycemia, unspecified: Secondary | ICD-10-CM

## 2021-07-28 DIAGNOSIS — F439 Reaction to severe stress, unspecified: Secondary | ICD-10-CM

## 2021-07-28 DIAGNOSIS — Z86010 Personal history of colonic polyps: Secondary | ICD-10-CM | POA: Diagnosis not present

## 2021-07-28 DIAGNOSIS — M533 Sacrococcygeal disorders, not elsewhere classified: Secondary | ICD-10-CM

## 2021-07-28 DIAGNOSIS — R059 Cough, unspecified: Secondary | ICD-10-CM

## 2021-07-28 NOTE — Assessment & Plan Note (Addendum)
The 10-year ASCVD risk score (Arnett DK, et al., 2019) is: 8.3%   Values used to calculate the score:     Age: 71 years     Sex: Female     Is Non-Hispanic African American: No     Diabetic: No     Tobacco smoker: No     Systolic Blood Pressure: 888 mmHg     Is BP treated: No     HDL Cholesterol: 57.4 mg/dL     Total Cholesterol: 192 mg/dL  Low carb diet and exercise.  Follow met b and a1. Discussed cholesterol.

## 2021-07-28 NOTE — Progress Notes (Signed)
[place]   Needs to talk with PCP about follow up

## 2021-07-28 NOTE — Progress Notes (Signed)
Patient ID: Lori Guerrero, female   DOB: 1951-02-10, 71 y.o.   MRN: [id number]   Virtual Visit via video Note  This visit type was conducted due to national recommendations for restrictions regarding the COVID-19 pandemic (e.g. social distancing).  This format is felt to be most appropriate for this patient at this time.  All issues noted in this document were discussed and addressed.  No physical exam was performed (except for noted visual exam findings with Video Visits).   I connected with Avilene Marrin by a video enabled telemedicine application and verified that I am speaking with the correct person using two identifiers. Location patient: home Location provider: work  Persons participating in the virtual visit: patient, provider  The limitations, risks, security and privacy concerns of performing an evaluation and management service by video and the availability of in person appointments have been discussed.  It has also been discussed with the patient that there may be a patient responsible charge related to this service. The patient expressed understanding and agreed to proceed.   Reason for visit: follow up appt  HPI: Follow up regarding acid reflux and increased stress.  She is taking prilosec.  Previous choking.  Resolved.  Discussed acid reflux.  Notices occasional burning.  Increased stress.  Discussed.  Does not feel needs any further intervention  at this time.  No chest pain.  Breathing stable.  No abdominal pain.  Does report pain - tail bone.  Plans to start using a cushion.  Taking zyrtec for cough.  No increased congestion.     ROS: See pertinent positives and negatives per HPI.  Past Medical History:  Diagnosis Date   GERD (gastroesophageal reflux disease)    Hyperglycemia    Hyperplastic colon polyp    Skin cancer    Vitamin D deficiency     Past Surgical History:  Procedure Laterality Date   BREAST BIOPSY     25 years ago   MOHS SURGERY     skin cancer     Family History  Problem Relation Age of Onset   Heart disease Mother    Cancer Paternal Grandfather        colon   Cancer Paternal Aunt        Breast cancer   Breast cancer Paternal Aunt 82   Breast cancer Maternal Aunt    Diabetes Maternal Grandmother     SOCIAL HX: reviewed.    Current Outpatient Medications:    famotidine (PEPCID) 20 MG tablet, Take 1 tablet (20 mg total) by mouth daily before supper., Disp: 90 tablet, Rfl: 3   Multiple Vitamins-Minerals (MULTIVITAMIN PO), Take by mouth daily., Disp: , Rfl:    omeprazole (PRILOSEC) 40 MG capsule, Take 1 capsule (40 mg total) by mouth daily before breakfast., Disp: 90 capsule, Rfl: 3  EXAM:  GENERAL: alert, oriented, appears well and in no acute distress  HEENT: atraumatic, conjunttiva clear, no obvious abnormalities on inspection of external nose and ears  NECK: normal movements of the head and neck  LUNGS: on inspection no signs of respiratory distress, breathing rate appears normal, no obvious gross SOB, gasping or wheezing  CV: no obvious cyanosis  PSYCH/NEURO: pleasant and cooperative, no obvious depression or anxiety, speech and thought processing grossly intact  ASSESSMENT AND PLAN:  Discussed the following assessment and plan:  Problem List Items Addressed This Visit     Cough    Continue prilosec/pepcid.  Taking zyrtec.  Follow.  GERD (gastroesophageal reflux disease)    prilosec in am and pepcid in evening.  Discussed GI referral given persistent issues.  Wants to hold at this time.        History of colonic polyps    Had f/u colonoscopy 10/2019.       Hyperglycemia    The 10-year ASCVD risk score (Arnett DK, et al., 2019) is: 8.3%   Values used to calculate the score:     Age: 71 years     Sex: Female     Is Non-Hispanic African American: No     Diabetic: No     Tobacco smoker: No     Systolic Blood Pressure: 155 mmHg     Is BP treated: No     HDL Cholesterol: 57.4 mg/dL     Total  Cholesterol: 192 mg/dL  Low carb diet and exercise.  Follow met b and a1. Discussed cholesterol.        Stress    Increased stress as outlined.  Discussed.  Request refill of xanax to have if needed.  Follow. Will notify me if feels needs any further intervention.       Tail bone pain    Cushion.  Discussed further w/up.  Will notify me if persistent.        Return if symptoms worsen or fail to improve, for keep scheduled. .   I discussed the assessment and treatment plan with the patient. The patient was provided an opportunity to ask questions and all were answered. The patient agreed with the plan and demonstrated an understanding of the instructions.   The patient was advised to call back or seek an in-person evaluation if the symptoms worsen or if the condition fails to improve as anticipated.    Einar Pheasant, MD

## 2021-08-07 ENCOUNTER — Encounter: Payer: Self-pay | Admitting: Internal Medicine

## 2021-08-07 DIAGNOSIS — M533 Sacrococcygeal disorders, not elsewhere classified: Secondary | ICD-10-CM | POA: Insufficient documentation

## 2021-08-07 NOTE — Assessment & Plan Note (Signed)
Continue prilosec/pepcid.  Taking zyrtec.  Follow.

## 2021-08-07 NOTE — Assessment & Plan Note (Signed)
prilosec in am and pepcid in evening.  Discussed GI referral given persistent issues.  Wants to hold at this time.

## 2021-08-07 NOTE — Assessment & Plan Note (Signed)
Cushion.  Discussed further w/up.  Will notify me if persistent.

## 2021-08-07 NOTE — Assessment & Plan Note (Signed)
Increased stress as outlined.  Discussed.  Request refill of xanax to have if needed.  Follow. Will notify me if feels needs any further intervention.

## 2021-08-07 NOTE — Assessment & Plan Note (Signed)
Had f/u colonoscopy 10/2019.  

## 2021-08-14 ENCOUNTER — Encounter: Payer: Self-pay | Admitting: Internal Medicine

## 2021-08-15 NOTE — Telephone Encounter (Signed)
Patient declined appt. Says she doesn't have any questions and just wants to know if it is harmful for her to take nexium. Pt stated she couldn't do virtual today either way because today is not a good day. Does not have good reception at her daughters. Virtual visits cost her money. Not available until Wednesday at the earliest. Patient would like to know if it is ok for her to start on nexium

## 2021-08-15 NOTE — Telephone Encounter (Signed)
I am ok if she starts nexium 40mg  q am - take 30 minutes before breakfast.  She will need to stop prilosec and take nexium.  Will need to let us know how she is doing.  Need to confirm nothing else causing

## 2021-08-15 NOTE — Telephone Encounter (Signed)
Pt requesting to trial nexium

## 2021-08-15 NOTE — Telephone Encounter (Signed)
See if can do virtual visit today to discuss.

## 2021-08-16 NOTE — Telephone Encounter (Signed)
Pt is aware of below.

## 2021-08-23 ENCOUNTER — Other Ambulatory Visit: Payer: Self-pay

## 2021-08-23 ENCOUNTER — Ambulatory Visit (INDEPENDENT_AMBULATORY_CARE_PROVIDER_SITE_OTHER): Payer: 59 | Admitting: Internal Medicine

## 2021-08-23 ENCOUNTER — Encounter: Payer: Self-pay | Admitting: Internal Medicine

## 2021-08-23 VITALS — BP 138/78 | HR 53 | Temp 98.1°F | Resp 18 | Ht 63.0 in | Wt 173.8 lb

## 2021-08-23 DIAGNOSIS — F439 Reaction to severe stress, unspecified: Secondary | ICD-10-CM | POA: Diagnosis not present

## 2021-08-23 DIAGNOSIS — M533 Sacrococcygeal disorders, not elsewhere classified: Secondary | ICD-10-CM

## 2021-08-23 DIAGNOSIS — J3489 Other specified disorders of nose and nasal sinuses: Secondary | ICD-10-CM

## 2021-08-23 DIAGNOSIS — Z0000 Encounter for general adult medical examination without abnormal findings: Secondary | ICD-10-CM

## 2021-08-23 DIAGNOSIS — K219 Gastro-esophageal reflux disease without esophagitis: Secondary | ICD-10-CM | POA: Diagnosis not present

## 2021-08-23 DIAGNOSIS — R739 Hyperglycemia, unspecified: Secondary | ICD-10-CM

## 2021-08-23 NOTE — Assessment & Plan Note (Addendum)
Physical today 08/23/21. Mammogram 06/24/21 - Birads I.  Colonoscopy - 07/2016 - recommended f/u in 3 years.  Per note, had f/u colonoscopy 10/2019.

## 2021-08-23 NOTE — Progress Notes (Signed)
Patient ID: Lori Guerrero, female   DOB: 1951/05/14, 72 y.o.   MRN: [id number]   Subjective:    Patient ID: Lori Guerrero, female    DOB: 01-18-1951, 71 y.o.   MRN: [id number]  This visit occurred during the SARS-CoV-2 public health emergency.  Safety protocols were in place, including screening questions prior to the visit, additional usage of staff PPE, and extensive cleaning of exam room while observing appropriate contact time as indicated for disinfecting solutions.   Patient here for her physical exam.   Chief Complaint  Patient presents with   Follow-up    Physical exam   .   HPI Reports increased pain - tailbone.d  denies any known injury or trauma.  Right hip as well.  Discussed xray.  No chest pain or sob reported.  No increased cough or congestion.  Taking PPI for acid reflux.  No abdominal pain reported.  Nasal lesion - dermatology.     Past Medical History:  Diagnosis Date   GERD (gastroesophageal reflux disease)    Hyperglycemia    Hyperplastic colon polyp    Skin cancer    Vitamin D deficiency    Past Surgical History:  Procedure Laterality Date   BREAST BIOPSY     25 years ago   MOHS SURGERY     skin cancer   Family History  Problem Relation Age of Onset   Heart disease Mother    Cancer Paternal Grandfather        colon   Cancer Paternal Aunt        Breast cancer   Breast cancer Paternal Aunt 29   Breast cancer Maternal Aunt    Diabetes Maternal Grandmother    Social History   Socioeconomic History   Marital status: Widowed    Spouse name: Not on file   Number of children: 1   Years of education: Not on file   Highest education level: Not on file  Occupational History   Not on file  Tobacco Use   Smoking status: Never   Smokeless tobacco: Never  Vaping Use   Vaping Use: Never used  Substance and Sexual Activity   Alcohol use: Yes    Alcohol/week: 0.0 standard drinks    Comment: occassionally   Drug use: No   Sexual activity: Not  Currently    Comment: 1st intercourse- 19, partners- 2, widow   Other Topics Concern   Not on file  Social History Narrative   Not on file   Social Determinants of Health   Financial Resource Strain: Not on file  Food Insecurity: Not on file  Transportation Needs: Not on file  Physical Activity: Not on file  Stress: Not on file  Social Connections: Not on file     Review of Systems  Constitutional:  Negative for appetite change and unexpected weight change.  HENT:  Negative for congestion, sinus pressure and sore throat.   Eyes:  Negative for pain and visual disturbance.  Respiratory:  Negative for cough, chest tightness and shortness of breath.   Cardiovascular:  Negative for chest pain, palpitations and leg swelling.  Gastrointestinal:  Negative for abdominal pain, diarrhea, nausea and vomiting.  Genitourinary:  Negative for difficulty urinating and dysuria.  Musculoskeletal:  Negative for joint swelling and myalgias.  Skin:  Negative for color change and rash.  Neurological:  Negative for dizziness, light-headedness and headaches.  Hematological:  Negative for adenopathy. Does not bruise/bleed easily.  Psychiatric/Behavioral:  Negative for agitation and dysphoric  mood.       Objective:     BP 138/78 (BP Location: Left Arm, Patient Position: Sitting, Cuff Size: Small)    Pulse (!) 53    Temp 98.1 F (36.7 C) (Oral)    Resp 18    Ht 5' 3"  (1.6 m)    Wt 173 lb 12.8 oz (78.8 kg)    SpO2 97%    BMI 30.79 kg/m  Wt Readings from Last 3 Encounters:  08/23/21 173 lb 12.8 oz (78.8 kg)  06/07/21 177 lb (80.3 kg)  04/19/21 177 lb 6.4 oz (80.5 kg)    Physical Exam Vitals reviewed.  Constitutional:      General: She is not in acute distress.    Appearance: Normal appearance. She is well-developed.  HENT:     Head: Normocephalic and atraumatic.     Right Ear: External ear normal.     Left Ear: External ear normal.  Eyes:     General: No scleral icterus.       Right eye: No  discharge.        Left eye: No discharge.     Conjunctiva/sclera: Conjunctivae normal.  Neck:     Thyroid: No thyromegaly.  Cardiovascular:     Rate and Rhythm: Normal rate and regular rhythm.  Pulmonary:     Effort: No tachypnea, accessory muscle usage or respiratory distress.     Breath sounds: Normal breath sounds. No decreased breath sounds or wheezing.  Chest:  Breasts:    Right: No inverted nipple, mass, nipple discharge or tenderness (no axillary adenopathy).     Left: No inverted nipple, mass, nipple discharge or tenderness (no axilarry adenopathy).  Abdominal:     General: Bowel sounds are normal.     Palpations: Abdomen is soft.     Tenderness: There is no abdominal tenderness.  Musculoskeletal:        General: No swelling or tenderness.     Cervical back: Neck supple. No tenderness.     Comments: Pain to palpation - coccyx.   Lymphadenopathy:     Cervical: No cervical adenopathy.  Skin:    Findings: No erythema or rash.  Neurological:     Mental Status: She is alert and oriented to person, place, and time.  Psychiatric:        Mood and Affect: Mood normal.        Behavior: Behavior normal.     Outpatient Encounter Medications as of 08/23/2021  Medication Sig   esomeprazole (NEXIUM) 20 MG capsule Take 20 mg by mouth daily at 12 noon.   famotidine (PEPCID) 20 MG tablet Take 1 tablet (20 mg total) by mouth daily before supper.   Multiple Vitamins-Minerals (MULTIVITAMIN PO) Take by mouth daily.   omeprazole (PRILOSEC) 40 MG capsule Take 1 capsule (40 mg total) by mouth daily before breakfast.   No facility-administered encounter medications on file as of 08/23/2021.     Lab Results  Component Value Date   WBC 3.8 (L) 07/27/2021   HGB 14.0 07/27/2021   HCT 41.3 07/27/2021   PLT 223.0 07/27/2021   GLUCOSE 112 (H) 07/27/2021   CHOL 192 07/27/2021   TRIG 68.0 07/27/2021   HDL 57.40 07/27/2021   LDLCALC 121 (H) 07/27/2021   ALT 22 07/27/2021   AST 21 07/27/2021    NA 141 07/27/2021   K 4.1 07/27/2021   CL 107 07/27/2021   CREATININE 0.89 07/27/2021   BUN 18 07/27/2021   CO2 25 07/27/2021  TSH 2.11 07/27/2021   HGBA1C 5.7 07/27/2021   MICROALBUR <0.7 02/07/2018    MM 3D SCREEN BREAST BILATERAL  Result Date: 06/24/2021 CLINICAL DATA:  Screening. EXAM: DIGITAL SCREENING BILATERAL MAMMOGRAM WITH TOMOSYNTHESIS AND CAD TECHNIQUE: Bilateral screening digital craniocaudal and mediolateral oblique mammograms were obtained. Bilateral screening digital breast tomosynthesis was performed. The images were evaluated with computer-aided detection. COMPARISON:  Previous exam(s). ACR Breast Density Category b: There are scattered areas of fibroglandular density. FINDINGS: There are no findings suspicious for malignancy. IMPRESSION: No mammographic evidence of malignancy. A result letter of this screening mammogram will be mailed directly to the patient. RECOMMENDATION: Screening mammogram in one year. (Code:SM-B-01Y) BI-RADS CATEGORY  1: Negative. Electronically Signed   By: Zerita Boers M.D.   On: 06/24/2021 15:20      Assessment & Plan:   Problem List Items Addressed This Visit     GERD (gastroesophageal reflux disease)    Continue PPI.  Have discussed GI referral.  Had wanted to hold.  Follow.       Relevant Medications   esomeprazole (NEXIUM) 20 MG capsule   Other Relevant Orders   CBC with Differential/Platelet   Hepatic function panel   Health care maintenance    Physical today 08/23/21. Mammogram 06/24/21 - Birads I.  Colonoscopy - 07/2016 - recommended f/u in 3 years.  Per note, had f/u colonoscopy 10/2019.       Hyperglycemia    Low carb diet and exercise.  Follow met b and a1c.       Relevant Orders   Hemoglobin A1c   Hepatic function panel   Lipid panel   Basic metabolic panel   Nasal lesion    Persistent.  Wanted to refer her to dermatology.  Wanted names of female dermatologist.  Information provided.  Will notify me if desires  referral.        Stress    Increased stress. Discussed.  Will notify me if feels needs any further intervention.       Tail bone pain    Continues.  Discussed further w/up including xray.  Cushion.  Wants to follow.  Notify me if persistent.       Other Visit Diagnoses     Routine general medical examination at a health care facility    -  Primary        Einar Pheasant, MD

## 2021-08-23 NOTE — Patient Instructions (Addendum)
Dr Nicole Kindred and Dr Laurence Ferrari ([place])  Dr Darrick Huntsman [facility])

## 2021-08-29 ENCOUNTER — Encounter: Payer: Self-pay | Admitting: Internal Medicine

## 2021-08-29 DIAGNOSIS — J3489 Other specified disorders of nose and nasal sinuses: Secondary | ICD-10-CM | POA: Insufficient documentation

## 2021-08-29 NOTE — Assessment & Plan Note (Signed)
Continues.  Discussed further w/up including xray.  Cushion.  Wants to follow.  Notify me if persistent.

## 2021-08-29 NOTE — Assessment & Plan Note (Signed)
Low carb diet and exercise.  Follow met b and a1c.

## 2021-08-29 NOTE — Assessment & Plan Note (Signed)
Persistent.  Wanted to refer her to dermatology.  Wanted names of female dermatologist.  Information provided.  Will notify me if desires referral.

## 2021-08-29 NOTE — Assessment & Plan Note (Signed)
Continue PPI.  Have discussed GI referral.  Had wanted to hold.  Follow.

## 2021-08-29 NOTE — Assessment & Plan Note (Signed)
Increased stress. Discussed.  Will notify me if feels needs any further intervention.

## 2021-09-08 ENCOUNTER — Encounter: Payer: 59 | Admitting: Internal Medicine

## 2021-09-23 ENCOUNTER — Encounter: Payer: Self-pay | Admitting: Internal Medicine

## 2021-11-16 ENCOUNTER — Other Ambulatory Visit (INDEPENDENT_AMBULATORY_CARE_PROVIDER_SITE_OTHER): Payer: 59

## 2021-11-16 DIAGNOSIS — R739 Hyperglycemia, unspecified: Secondary | ICD-10-CM | POA: Diagnosis not present

## 2021-11-16 DIAGNOSIS — K219 Gastro-esophageal reflux disease without esophagitis: Secondary | ICD-10-CM | POA: Diagnosis not present

## 2021-11-16 LAB — LIPID PANEL
Cholesterol: 195 mg/dL (ref 0–200)
HDL: 49.7 mg/dL (ref >39.00)
LDL Cholesterol: 126 mg/dL — ABNORMAL HIGH (ref 0–99)
NonHDL: 145.57
Total CHOL/HDL Ratio: 4
Triglycerides: 98 mg/dL (ref 0.0–149.0)
VLDL: 19.6 mg/dL (ref 0.0–40.0)

## 2021-11-16 LAB — HEPATIC FUNCTION PANEL
ALT: 18 U/L (ref 0–35)
AST: 19 U/L (ref 0–37)
Albumin: 4 g/dL (ref 3.5–5.2)
Alkaline Phosphatase: 104 U/L (ref 39–117)
Bilirubin, Direct: 0.1 mg/dL (ref 0.0–0.3)
Total Bilirubin: 0.5 mg/dL (ref 0.2–1.2)
Total Protein: 6.4 g/dL (ref 6.0–8.3)

## 2021-11-16 LAB — CBC WITH DIFFERENTIAL/PLATELET
Basophils Absolute: 0 10*3/uL (ref 0.0–0.1)
Basophils Relative: 0.9 % (ref 0.0–3.0)
Eosinophils Absolute: 0.1 10*3/uL (ref 0.0–0.7)
Eosinophils Relative: 2 % (ref 0.0–5.0)
HCT: 40.2 % (ref 36.0–46.0)
Hemoglobin: 14.1 g/dL (ref 12.0–15.0)
Lymphocytes Relative: 31 % (ref 12.0–46.0)
Lymphs Abs: 1.1 10*3/uL (ref 0.7–4.0)
MCHC: 35.1 g/dL (ref 30.0–36.0)
MCV: 92.4 fl (ref 78.0–100.0)
Monocytes Absolute: 0.2 10*3/uL (ref 0.1–1.0)
Monocytes Relative: 5.9 % (ref 3.0–12.0)
Neutro Abs: 2.2 10*3/uL (ref 1.4–7.7)
Neutrophils Relative %: 60.2 % (ref 43.0–77.0)
Platelets: 228 10*3/uL (ref 150.0–400.0)
RBC: 4.35 Mil/uL (ref 3.87–5.11)
RDW: 13 % (ref 11.5–15.5)
WBC: 3.7 10*3/uL — ABNORMAL LOW (ref 4.0–10.5)

## 2021-11-16 LAB — BASIC METABOLIC PANEL
BUN: 18 mg/dL (ref 6–23)
CO2: 24 mEq/L (ref 19–32)
Calcium: 9.3 mg/dL (ref 8.4–10.5)
Chloride: 109 mEq/L (ref 96–112)
Creatinine, Ser: 0.91 mg/dL (ref 0.40–1.20)
GFR: 64.27 mL/min (ref >60.00)
Glucose, Bld: 107 mg/dL — ABNORMAL HIGH (ref 70–99)
Potassium: 4 mEq/L (ref 3.5–5.1)
Sodium: 141 mEq/L (ref 135–145)

## 2021-11-16 LAB — HEMOGLOBIN A1C: Hgb A1c MFr Bld: 5.8 % (ref 4.6–6.5)

## 2021-11-21 ENCOUNTER — Ambulatory Visit (INDEPENDENT_AMBULATORY_CARE_PROVIDER_SITE_OTHER): Payer: 59 | Admitting: Internal Medicine

## 2021-11-21 ENCOUNTER — Encounter: Payer: Self-pay | Admitting: Internal Medicine

## 2021-11-21 ENCOUNTER — Ambulatory Visit (INDEPENDENT_AMBULATORY_CARE_PROVIDER_SITE_OTHER): Payer: 59

## 2021-11-21 VITALS — BP 128/70 | HR 65 | Temp 98.0°F | Resp 16 | Ht 63.0 in | Wt 178.6 lb

## 2021-11-21 DIAGNOSIS — R739 Hyperglycemia, unspecified: Secondary | ICD-10-CM

## 2021-11-21 DIAGNOSIS — D72819 Decreased white blood cell count, unspecified: Secondary | ICD-10-CM

## 2021-11-21 DIAGNOSIS — K219 Gastro-esophageal reflux disease without esophagitis: Secondary | ICD-10-CM | POA: Diagnosis not present

## 2021-11-21 DIAGNOSIS — M5489 Other dorsalgia: Secondary | ICD-10-CM

## 2021-11-21 DIAGNOSIS — Z86010 Personal history of colonic polyps: Secondary | ICD-10-CM

## 2021-11-21 DIAGNOSIS — M25551 Pain in right hip: Secondary | ICD-10-CM | POA: Diagnosis not present

## 2021-11-21 DIAGNOSIS — F439 Reaction to severe stress, unspecified: Secondary | ICD-10-CM

## 2021-11-21 NOTE — Assessment & Plan Note (Addendum)
The 10-year ASCVD risk score (Arnett DK, et al., 2019) is: 8.7%   Values used to calculate the score:     Age: 70 years     Sex: Female     Is Non-Hispanic African American: No     Diabetic: No     Tobacco smoker: No     Systolic Blood Pressure: 128 mmHg     Is BP treated: No     HDL Cholesterol: 49.7 mg/dL     Total Cholesterol: 195 mg/dL  Low carb diet and exercise.  Discussed cholesterol medication.  Wants to work on diet and exercise.  Follow met b and a1c.  

## 2021-11-21 NOTE — Progress Notes (Signed)
Patient ID: Lori Guerrero, female   DOB: [birth year], 71 y.o.   MRN: [id number]   Subjective:    Patient ID: Lori Guerrero, female    DOB: Apr 14, 1951, 71 y.o.   MRN: [id number]   Patient here for a scheduled follow up.  Marland Kitchen   HPI Here to follow up regarding blood sugar, reflux and back pain.  Had on boots and apparently caught - fell 09/17/21.  Right side pain/right arm sore.  Better.  Left lower back pain/left side discomfort.  No radiatio of pain.  Tries massage therapist.  Hurts to sit.  Walking - no pain.  No chest pain or sob reported.  No cough or congestion.  No abdominal pain.  Bowels moving.     Past Medical History:  Diagnosis Date   GERD (gastroesophageal reflux disease)    Hyperglycemia    Hyperplastic colon polyp    Skin cancer    Vitamin D deficiency    Past Surgical History:  Procedure Laterality Date   BREAST BIOPSY     25 years ago   MOHS SURGERY     skin cancer   Family History  Problem Relation Age of Onset   Heart disease Mother    Cancer Paternal Grandfather        colon   Cancer Paternal Aunt        Breast cancer   Breast cancer Paternal Aunt 50   Breast cancer Maternal Aunt    Diabetes Maternal Grandmother    Social History   Socioeconomic History   Marital status: Widowed    Spouse name: Not on file   Number of children: 1   Years of education: Not on file   Highest education level: Not on file  Occupational History   Not on file  Tobacco Use   Smoking status: Never   Smokeless tobacco: Never  Vaping Use   Vaping Use: Never used  Substance and Sexual Activity   Alcohol use: Yes    Alcohol/week: 0.0 standard drinks    Comment: occassionally   Drug use: No   Sexual activity: Not Currently    Comment: 1st intercourse- 19, partners- 2, widow   Other Topics Concern   Not on file  Social History Narrative   Not on file   Social Determinants of Health   Financial Resource Strain: Not on file  Food Insecurity: Not on file   Transportation Needs: Not on file  Physical Activity: Not on file  Stress: Not on file  Social Connections: Not on file     Review of Systems  Constitutional:  Negative for appetite change and unexpected weight change.  HENT:  Negative for congestion and sinus pressure.   Respiratory:  Negative for cough, chest tightness and shortness of breath.   Cardiovascular:  Negative for chest pain and palpitations.  Gastrointestinal:  Negative for abdominal pain, diarrhea, nausea and vomiting.  Genitourinary:  Negative for difficulty urinating and dysuria.  Musculoskeletal:  Negative for joint swelling and myalgias.       Left lower back and left side pain.  Persistent.   Skin:  Negative for color change and rash.  Neurological:  Negative for dizziness, light-headedness and headaches.  Psychiatric/Behavioral:  Negative for agitation and dysphoric mood.       Objective:     BP 128/70 (BP Location: Left Arm, Patient Position: Sitting, Cuff Size: Small)   Pulse 65   Temp 98 F (36.7 C) (Temporal)   Resp 16   Ht  5' 3" (1.6 m)   Wt 178 lb 9.6 oz (81 kg)   SpO2 98%   BMI 31.64 kg/m  Wt Readings from Last 3 Encounters:  11/21/21 178 lb 9.6 oz (81 kg)  08/23/21 173 lb 12.8 oz (78.8 kg)  06/07/21 177 lb (80.3 kg)    Physical Exam Vitals reviewed.  Constitutional:      General: She is not in acute distress.    Appearance: Normal appearance.  HENT:     Head: Normocephalic and atraumatic.     Right Ear: External ear normal.     Left Ear: External ear normal.  Eyes:     General: No scleral icterus.       Right eye: No discharge.        Left eye: No discharge.     Conjunctiva/sclera: Conjunctivae normal.  Neck:     Thyroid: No thyromegaly.  Cardiovascular:     Rate and Rhythm: Normal rate and regular rhythm.  Pulmonary:     Effort: No respiratory distress.     Breath sounds: Normal breath sounds. No wheezing.  Abdominal:     General: Bowel sounds are normal.     Palpations:  Abdomen is soft.     Tenderness: There is no abdominal tenderness.  Musculoskeletal:        General: No swelling or tenderness.     Cervical back: Neck supple. No tenderness.  Lymphadenopathy:     Cervical: No cervical adenopathy.  Skin:    Findings: No erythema or rash.  Neurological:     Mental Status: She is alert.  Psychiatric:        Mood and Affect: Mood normal.        Behavior: Behavior normal.     Outpatient Encounter Medications as of 11/21/2021  Medication Sig   esomeprazole (NEXIUM) 20 MG capsule Take 20 mg by mouth daily at 12 noon.   famotidine (PEPCID) 20 MG tablet Take 1 tablet (20 mg total) by mouth daily before supper.   Multiple Vitamins-Minerals (MULTIVITAMIN PO) Take by mouth daily.   [DISCONTINUED] omeprazole (PRILOSEC) 40 MG capsule Take 1 capsule (40 mg total) by mouth daily before breakfast. (Patient not taking: Reported on 11/21/2021)   No facility-administered encounter medications on file as of 11/21/2021.     Lab Results  Component Value Date   WBC 3.7 (L) 11/16/2021   HGB 14.1 11/16/2021   HCT 40.2 11/16/2021   PLT 228.0 11/16/2021   GLUCOSE 107 (H) 11/16/2021   CHOL 195 11/16/2021   TRIG 98.0 11/16/2021   HDL 49.70 11/16/2021   LDLCALC 126 (H) 11/16/2021   ALT 18 11/16/2021   AST 19 11/16/2021   NA 141 11/16/2021   K 4.0 11/16/2021   CL 109 11/16/2021   CREATININE 0.91 11/16/2021   BUN 18 11/16/2021   CO2 24 11/16/2021   TSH 2.11 07/27/2021   HGBA1C 5.8 11/16/2021   MICROALBUR <0.7 02/07/2018    MM 3D SCREEN BREAST BILATERAL  Result Date: 06/24/2021 CLINICAL DATA:  Screening. EXAM: DIGITAL SCREENING BILATERAL MAMMOGRAM WITH TOMOSYNTHESIS AND CAD TECHNIQUE: Bilateral screening digital craniocaudal and mediolateral oblique mammograms were obtained. Bilateral screening digital breast tomosynthesis was performed. The images were evaluated with computer-aided detection. COMPARISON:  Previous exam(s). ACR Breast Density Category b: There are  scattered areas of fibroglandular density. FINDINGS: There are no findings suspicious for malignancy. IMPRESSION: No mammographic evidence of malignancy. A result letter of this screening mammogram will be mailed directly to the patient. RECOMMENDATION: Screening  mammogram in one year. (Code:SM-B-01Y) BI-RADS CATEGORY  1: Negative. Electronically Signed   By: Zerita Boers M.D.   On: 06/24/2021 15:20      Assessment & Plan:   Problem List Items Addressed This Visit     Back pain - Primary    Increased back pain - low back pain and left side pain.  Given recent fall.  Persistent.  Check xray.         Relevant Orders   DG Lumbar Spine 2-3 Views (Completed)   DG Thoracic Spine 2 View (Completed)   GERD (gastroesophageal reflux disease)    Continue PPI.         Relevant Orders   Hepatic function panel   Hip pain, right    Right side/hip pain as outlined.  Recent fall.  Check xray.  Further w/up pending results.        Relevant Orders   DG Hip Unilat W OR W/O Pelvis 2-3 Views Right (Completed)   History of colonic polyps    Had f/u colonoscopy 10/2019.        Hyperglycemia    The 10-year ASCVD risk score (Arnett DK, et al., 2019) is: 8.7%   Values used to calculate the score:     Age: 34 years     Sex: Female     Is Non-Hispanic African American: No     Diabetic: No     Tobacco smoker: No     Systolic Blood Pressure: 664 mmHg     Is BP treated: No     HDL Cholesterol: 49.7 mg/dL     Total Cholesterol: 195 mg/dL  Low carb diet and exercise.  Discussed cholesterol medication.  Wants to work on diet and exercise.  Follow met b and a1c.        Relevant Orders   Hemoglobin Q0H   Basic metabolic panel   Lipid panel   Stress    Overall appears to be handling things relatively well.  Follow.        Other Visit Diagnoses     Leukopenia, unspecified type       Relevant Orders   CBC with Differential/Platelet        Einar Pheasant, MD

## 2021-11-24 ENCOUNTER — Other Ambulatory Visit: Payer: Self-pay | Admitting: Internal Medicine

## 2021-11-24 DIAGNOSIS — M25551 Pain in right hip: Secondary | ICD-10-CM

## 2021-11-24 DIAGNOSIS — M5489 Other dorsalgia: Secondary | ICD-10-CM

## 2021-11-24 NOTE — Progress Notes (Signed)
Order placed for PT referral.  

## 2021-11-28 ENCOUNTER — Encounter: Payer: Self-pay | Admitting: Internal Medicine

## 2021-11-28 NOTE — Assessment & Plan Note (Signed)
Continue PPI ?

## 2021-11-28 NOTE — Assessment & Plan Note (Signed)
Had f/u colonoscopy 10/2019.  

## 2021-11-28 NOTE — Assessment & Plan Note (Signed)
Overall appears to be handling things relatively well.  Follow.   

## 2021-11-28 NOTE — Assessment & Plan Note (Signed)
Increased back pain - low back pain and left side pain.  Given recent fall.  Persistent.  Check xray.

## 2021-11-28 NOTE — Assessment & Plan Note (Signed)
Right side/hip pain as outlined.  Recent fall.  Check xray.  Further w/up pending results.

## 2021-12-14 ENCOUNTER — Telehealth: Payer: Self-pay | Admitting: Physical Therapy

## 2021-12-14 NOTE — Telephone Encounter (Signed)
Attempted to call pt to move forward in the schedule because of openings. Pt reports having class throughout the day, and she would call back if she was able to come in today.

## 2021-12-15 ENCOUNTER — Encounter: Payer: Self-pay | Admitting: Physical Therapy

## 2021-12-15 ENCOUNTER — Ambulatory Visit: Payer: 59 | Attending: Internal Medicine | Admitting: Physical Therapy

## 2021-12-15 ENCOUNTER — Other Ambulatory Visit: Payer: Self-pay

## 2021-12-15 DIAGNOSIS — M5489 Other dorsalgia: Secondary | ICD-10-CM | POA: Insufficient documentation

## 2021-12-15 DIAGNOSIS — M25551 Pain in right hip: Secondary | ICD-10-CM | POA: Diagnosis not present

## 2021-12-15 DIAGNOSIS — M5459 Other low back pain: Secondary | ICD-10-CM | POA: Insufficient documentation

## 2021-12-15 NOTE — Therapy (Signed)
OUTPATIENT PHYSICAL THERAPY THORACOLUMBAR EVALUATION   Patient Name: Lori Guerrero MRN: [id number] DOB:Mar 30, 1951, 71 y.o., female Today's Date: 12/15/2021   PT End of Session - 12/15/21 1320     Visit Number 1    Number of Visits 20    Date for PT Re-Evaluation 02/09/22    Authorization Type [employer]    Authorization - Visit Number 1    Authorization - Number of Visits 60    Progress Note Due on Visit 10    PT Start Time 1100    PT Stop Time 1145    PT Time Calculation (min) 45 min    Activity Tolerance Patient tolerated treatment well    Behavior During Therapy WFL for tasks assessed/performed             Past Medical History:  Diagnosis Date   GERD (gastroesophageal reflux disease)    Hyperglycemia    Hyperplastic colon polyp    Skin cancer    Vitamin D deficiency    Past Surgical History:  Procedure Laterality Date   BREAST BIOPSY     25 years ago   MOHS SURGERY     skin cancer   Patient Active Problem List   Diagnosis Date Noted   Hip pain, right 11/21/2021   Nasal lesion 08/29/2021   Tail bone pain 08/07/2021   Foot pain, right 05/02/2021   Abnormal CXR 01/18/2021   Cough 05/18/2020   Blue toes 12/06/2019   Toe pain 11/16/2019   SOB (shortness of breath) on exertion 10/18/2019   Dizziness 10/18/2019   Back pain 06/30/2019   Breast asymmetry 05/09/2019   Right arm pain 04/23/2019   Skin lesions 04/23/2019   Right upper quadrant abdominal pain 05/22/2018   Nasal congestion 08/04/2017   Cerumen impaction 08/04/2017   Stress 04/05/2015   Health care maintenance 04/05/2015   Lower abdominal pain 04/14/2013   Hyperglycemia 08/13/2012   GERD (gastroesophageal reflux disease) 08/13/2012   History of colonic polyps 08/13/2012   Vitamin D deficiency 08/13/2012   Skin cancer 08/13/2012    PCP: Dr. Einar Pheasant   REFERRING PROVIDER: Dr. Einar Pheasant   REFERRING DIAG:  M54.89 (ICD-10-CM) - Midline back pain, unspecified back location,  unspecified chronicity M25.551 (ICD-10-CM) - Hip pain, right   Rationale for Evaluation and Treatment Rehabilitation  THERAPY DIAG:  Other low back pain  ONSET DATE: 09/17/21   SUBJECTIVE:                                                                                                                                                                                           SUBJECTIVE STATEMENT:  Pt reports having back that is spreads across middle of low back. She describes sitting most of the time with her job and that this is what causes most of her pain. Pain is relieved when she is moving around. When she fell, she tripped over a step and she is not sure if she hit her head but does not report any bumps and bruises. 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    PERTINENT HISTORY:  Dr. Einar Pheasant per 11/21/21    HPI Here to follow up regarding blood sugar, reflux and back pain.  Had on boots and apparently caught - fell 09/17/21.  Right side pain/right arm sore.  Better.  Left lower back pain/left side discomfort.  No radiatio of pain.  Tries massage therapist.  Hurts to sit.  Walking - no pain.  No chest pain or sob reported.  No cough or congestion.  No abdominal pain.  Bowels moving.  Back pain - Primary       Increased back pain - low back pain and left side pain.  Given recent fall.  Persistent.  Check xray.      PAIN:  Are you having pain? Yes: NPRS scale: 3/10 Pain location: Right, lateral side of hip Pain description: Achy Aggravating factors: Sitting for long periods of time  Relieving factors: Walking and moving. Deep blue oil (essential oil). Forward flexion relieves back pain   PRECAUTIONS: None  WEIGHT BEARING RESTRICTIONS No  FALLS:  Has patient fallen in last 6 months? Yes, 09/17/21   LIVING ENVIRONMENT: Lives with: lives alone Lives in: House/apartment Stairs:  Need to ask  Has following equipment at home: None  OCCUPATION: Works at a desk  job for [employer]   PLOF: Independent  PATIENT GOALS Reduce low back and hip pain    OBJECTIVE:              VITALS:  In sitting                           BP   115/49 HR 68 SpO2 98  DIAGNOSTIC FINDINGS:  CLINICAL DATA:  Low back pain.   EXAM: LUMBAR SPINE - 2-3 VIEW   COMPARISON:  CT abdomen pelvis dated 11/06/2019.   FINDINGS: Five lumbar type vertebra. There is no acute fracture or subluxation of the lumbar spine. Multilevel degenerative changes with disc space narrowing and spurring and mild dextroscoliosis. Multilevel facet arthropathy. The visualized posterior elements are intact. The soft tissues are unremarkable.   IMPRESSION: No acute findings.     Electronically Signed   By: Anner Crete M.D.   On: 11/21/2021 22:38 ////////////////////////////////////////////////////////// CLINICAL DATA:  Fall and right hip pain.   EXAM: DG HIP (WITH OR WITHOUT PELVIS) 2-3V RIGHT   COMPARISON:  None Available.   FINDINGS: There is no evidence of hip fracture or dislocation. There is no evidence of arthropathy or other focal bone abnormality.   IMPRESSION: Negative.     Electronically Signed   By: Anner Crete M.D.   On: 11/21/2021 22:37  PATIENT SURVEYS:  FOTO    SCREENING FOR RED FLAGS: Bowel or bladder incontinence: No Spinal tumors: No Cauda equina syndrome: No Compression fracture: No Abdominal aneurysm: No  COGNITION:  Overall cognitive status: Within functional limits for tasks assessed     SENSATION: WFL  MUSCLE LENGTH: Hamstrings: Right 80 deg; Left 80 deg Thomas test: Right 0 deg; Left 0 deg  POSTURE:  No Significant postural limitations  PALPATION: Right ribs and T10-T12 spinous processes   LUMBAR ROM:   Active  A/PROM  eval  Flexion 100%  Extension 100%  Right lateral flexion 100%*  Left lateral flexion 100%*  Right rotation 100%  Left rotation 100%   (Blank rows = not tested)  LOWER EXTREMITY ROM:         Active  Right Left   Hip flexion 120 120  Hip extension 30 30  Hip abduction 45 45  Hip adduction 30 30  Hip internal rotation 45 45  Hip external rotation 45 45  Knee flexion 135 135  Knee extension 0 0  Ankle dorsiflexion 20 20  Ankle plantarflexion 50 50  Ankle inversion    Ankle eversion     (Blank rows = not tested)     LOWER EXTREMITY MMT:    MMT Right eval Left eval  Hip flexion 5 5  Hip extension 4 4  Hip abduction 4 4  Hip adduction 3+ 3+  Hip internal rotation    Hip external rotation    Knee flexion 5 5  Knee extension 5 5  Ankle dorsiflexion 5 5  Ankle plantarflexion    Ankle inversion    Ankle eversion     (Blank rows = not tested)  LUMBAR SPECIAL TESTS:  Straight leg raise test: Negative, SI Compression/distraction test: Positive on RLE, FABER test: Negative, and Thomas test: Negative and FADIR negative   FUNCTIONAL TESTS:  None performed   GAIT: Distance walked: 25 ft  Assistive device utilized: None Level of assistance: Complete Independence Comments: No gait deficits noted     TODAY'S TREATMENT  Lower Trunk Rotation 3 x 10  Seated Hamstring Stretch 2 x 30 sec    PATIENT EDUCATION:  Education details: form and function and explanation about deficits  Person educated: Patient Education method: Consulting civil engineer, Media planner, Verbal cues, and Handouts Education comprehension: verbalized understanding, returned demonstration, and verbal cues required   HOME EXERCISE PROGRAM: Access Code: [email or web address] URL: [email or web address] Date: 12/15/2021 Prepared by: Bradly Chris  Exercises - Seated Hamstring Stretch  - 1 x daily - 7 x weekly - 1 sets - 3 reps - 60 hold - Supine Lower Trunk Rotation  - 1 x daily - 7 x weekly - 3 sets - 10 reps  ASSESSMENT:  CLINICAL IMPRESSION: Patient is a 71 y.o. elderly white female who was seen today for physical therapy evaluation and treatment for unspecific, low back pain.  Radicular symptoms ruled out during examination. She exhibits moderate disability, stable symptoms, and moderate to low pain that indicate she should be in the movement control treatment group. She exhibit deficits that include decreased hip flexibility and strength and increase low back pain. Pt will benefit from skilled PT to resolve these  aforementioned deficits to return to recreational activities and to sitting for prolonged periods of times for her job.     OBJECTIVE IMPAIRMENTS decreased strength, impaired flexibility, and pain.   ACTIVITY LIMITATIONS sitting and squatting  PARTICIPATION LIMITATIONS: driving, occupation, yard work, and recreational activity   [place] Age, Sex, and 1-2 comorbidities: hyperglycemia and h/o cancer  are also affecting patient's functional outcome.   REHAB POTENTIAL: Good  CLINICAL DECISION MAKING: Stable/uncomplicated  EVALUATION COMPLEXITY: Low   GOALS: Goals reviewed with patient? No  SHORT TERM GOALS: Target date: 12/29/2021  Pt will be independent with HEP in order to improve strength and balance in order to decrease fall risk and improve function at home  and work. Baseline: NT Goal status: INITIAL    LONG TERM GOALS: Target date: 02/09/2022  Patient will have improved function and activity level as evidenced by an increase in FOTO score by 10 points or more.  Baseline: 60/74 Goal status: INITIAL  2. 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  Baseline: Hip R/L Add 4/4, Abd 4/4, Ext 4/4 Goal Status: INITIAL   3. Patient will demonstrate improved core strength as evidenced by maintaining 40 mmHg pressure during stabilizer biofeedback measurement of TA in supine and with leg loading.  Baseline: Not performed  Goal status: INITIAL    PLAN: PT FREQUENCY: 1-2x/week  PT  DURATION: 10 weeks  PLANNED INTERVENTIONS: Therapeutic exercises, Therapeutic activity, Neuromuscular re-education, Balance training, Gait training, Patient/Family education, Joint manipulation, Joint mobilization, Stair training, DME instructions, Aquatic Therapy, Dry Needling, Electrical stimulation, Cryotherapy, Moist heat, Traction, Manual therapy, and Re-evaluation.  PLAN FOR NEXT SESSION: Ask about home info, Finish Hip MMT, Joint Play of Spine, Abdominal Core Testing, Core and Hip Strengthening, Single Leg Stance, Balance Testing?  Bradly Chris PT, DPT  12/15/2021, 1:21 PM

## 2021-12-19 ENCOUNTER — Ambulatory Visit: Payer: 59 | Admitting: Physical Therapy

## 2021-12-19 ENCOUNTER — Encounter: Payer: Self-pay | Admitting: Physical Therapy

## 2021-12-19 DIAGNOSIS — M5459 Other low back pain: Secondary | ICD-10-CM | POA: Diagnosis not present

## 2021-12-19 NOTE — Therapy (Signed)
OUTPATIENT PHYSICAL THERAPY TREATMENT NOTE   Patient Name: Lori Guerrero MRN: [id number] DOB:[birth year], 71 y.o., female Today's Date: 12/19/2021  PCP: Dr. Einar Pheasant  REFERRING PROVIDER: Dr. Einar Pheasant   END OF SESSION:   PT End of Session - 12/19/21 1019     Visit Number 2    Number of Visits 20    Date for PT Re-Evaluation 02/09/22    Authorization Type [employer]    Authorization - Visit Number 2    Authorization - Number of Visits 60    Progress Note Due on Visit 10    PT Start Time 1015    PT Stop Time 1100    PT Time Calculation (min) 45 min    Activity Tolerance Patient tolerated treatment well    Behavior During Therapy WFL for tasks assessed/performed             Past Medical History:  Diagnosis Date   GERD (gastroesophageal reflux disease)    Hyperglycemia    Hyperplastic colon polyp    Skin cancer    Vitamin D deficiency    Past Surgical History:  Procedure Laterality Date   BREAST BIOPSY     25 years ago   MOHS SURGERY     skin cancer   Patient Active Problem List   Diagnosis Date Noted   Hip pain, right 11/21/2021   Nasal lesion 08/29/2021   Tail bone pain 08/07/2021   Foot pain, right 05/02/2021   Abnormal CXR 01/18/2021   Cough 05/18/2020   Blue toes 12/06/2019   Toe pain 11/16/2019   SOB (shortness of breath) on exertion 10/18/2019   Dizziness 10/18/2019   Back pain 06/30/2019   Breast asymmetry 05/09/2019   Right arm pain 04/23/2019   Skin lesions 04/23/2019   Right upper quadrant abdominal pain 05/22/2018   Nasal congestion 08/04/2017   Cerumen impaction 08/04/2017   Stress 04/05/2015   Health care maintenance 04/05/2015   Lower abdominal pain 04/14/2013   Hyperglycemia 08/13/2012   GERD (gastroesophageal reflux disease) 08/13/2012   History of colonic polyps 08/13/2012   Vitamin D deficiency 08/13/2012   Skin cancer 08/13/2012    REFERRING DIAG:  M54.89 (ICD-10-CM) - Midline back pain, unspecified back location,  unspecified chronicity M25.551 (ICD-10-CM) - Hip pain, right  THERAPY DIAG:  Other low back pain  Rationale for Evaluation and Treatment Rehabilitation  PERTINENT HISTORY:   Dr. Einar Pheasant per 11/21/21     HPI Here to follow up regarding blood sugar, reflux and back pain.  Had on boots and apparently caught - fell 09/17/21.  Right side pain/right arm sore.  Better.  Left lower back pain/left side discomfort.  No radiatio of pain.  Tries massage therapist.  Hurts to sit.  Walking - no pain.  No chest pain or sob reported.  No cough or congestion.  No abdominal pain.  Bowels moving.   Back pain - Primary                           Increased back pain - low back pain and left side pain.  Given recent fall.  Persistent.  Check xray.  PRECAUTIONS: None   SUBJECTIVE: Pt reports that she felt some increase in her low back pain and right hip pain while vacationing on the beach.   PAIN:  Are you having pain? Yes: NPRS scale: 5/10 Pain location: Right hip  Pain description: Achy Aggravating factors: Sitting for prolonged periods  of time  Relieving factors: walking around    OBJECTIVE: (objective measures completed at initial evaluation unless otherwise dated)  VITALS:  In sitting                           BP   115/49 HR 68 SpO2 98   DIAGNOSTIC FINDINGS:  CLINICAL DATA:  Low back pain.   EXAM: LUMBAR SPINE - 2-3 VIEW   COMPARISON:  CT abdomen pelvis dated 11/06/2019.   FINDINGS: Five lumbar type vertebra. There is no acute fracture or subluxation of the lumbar spine. Multilevel degenerative changes with disc space narrowing and spurring and mild dextroscoliosis. Multilevel facet arthropathy. The visualized posterior elements are intact. The soft tissues are unremarkable.   IMPRESSION: No acute findings.     Electronically Signed   By: Anner Crete M.D.   On: 11/21/2021 22:38 ////////////////////////////////////////////////////////// CLINICAL DATA:  Fall and right  hip pain.   EXAM: DG HIP (WITH OR WITHOUT PELVIS) 2-3V RIGHT   COMPARISON:  None Available.   FINDINGS: There is no evidence of hip fracture or dislocation. There is no evidence of arthropathy or other focal bone abnormality.   IMPRESSION: Negative.     Electronically Signed   By: Anner Crete M.D.   On: 11/21/2021 22:37   PATIENT SURVEYS:  FOTO     SCREENING FOR RED FLAGS: Bowel or bladder incontinence: No Spinal tumors: No Cauda equina syndrome: No Compression fracture: No Abdominal aneurysm: No   COGNITION:           Overall cognitive status: Within functional limits for tasks assessed                          SENSATION: WFL   MUSCLE LENGTH: Hamstrings: Right 80 deg; Left 80 deg Thomas test: Right 0 deg; Left 0 deg   POSTURE: No Significant postural limitations   PALPATION: Right ribs and T10-T12 spinous processes    LUMBAR ROM:    Active  A/PROM  eval  Flexion 100%  Extension 100%  Right lateral flexion 100%*  Left lateral flexion 100%*  Right rotation 100%  Left rotation 100%   (Blank rows = not tested)   LOWER EXTREMITY ROM:            Active  Right Left    Hip flexion 120 120  Hip extension 30 30  Hip abduction 45 45  Hip adduction 30 30  Hip internal rotation 45 45  Hip external rotation 45 45  Knee flexion 135 135  Knee extension 0 0  Ankle dorsiflexion 20 20  Ankle plantarflexion 50 50  Ankle inversion      Ankle eversion       (Blank rows = not tested)        LOWER EXTREMITY MMT:     MMT Right eval Left eval  Hip flexion 5 5  Hip extension 4 4  Hip abduction 4 4  Hip adduction 3+ 3+  Hip internal rotation      Hip external rotation      Knee flexion 5 5  Knee extension 5 5  Ankle dorsiflexion 5 5  Ankle plantarflexion      Ankle inversion      Ankle eversion       (Blank rows = not tested)   LUMBAR SPECIAL TESTS:  Straight leg raise test: Negative, SI Compression/distraction test: Positive on RLE, FABER  test:  Negative, and Thomas test: Negative and FADIR negative    FUNCTIONAL TESTS:  None performed    GAIT: Distance walked: 25 ft  Assistive device utilized: None Level of assistance: Complete Independence Comments: No gait deficits noted        TODAY'S TREATMENT   12/19/21             TM 2.5 mph 5 min               Hip Adductor Isometric 5 sec hold 3 x 10               Supine Bridges 3 x 10                Supine TA Activation with BP cuff for 10 sec hold x 10                          Dead Bugs 2 x 10   Initial  Lower Trunk Rotation 3 x 10  Seated Hamstring Stretch 2 x 30 sec      PATIENT EDUCATION:  Education details: form and function and explanation about deficits  Person educated: Patient Education method: Consulting civil engineer, Demonstration, Verbal cues, and Handouts Education comprehension: verbalized understanding, returned demonstration, and verbal cues required     HOME EXERCISE PROGRAM: Access Code: [email or web address] URL: [email or web address] Date: 12/19/2021 Prepared by: Bradly Chris  Exercises - Seated Hamstring Stretch  - 1 x daily - 7 x weekly - 1 sets - 3 reps - 60 hold - Supine Lower Trunk Rotation  - 1 x daily - 7 x weekly - 3 sets - 10 reps - Supine Hip Adduction Isometric with Ball  - 1 x daily - 3 x weekly - 3 sets - 10 reps - 5 hold - Supine Bridge  - 1 x daily - 3 x weekly - 3 sets - 10 reps - Supine Dead Bug with Leg Extension  - 1 x daily - 7 x weekly - 1 sets - 10 reps - 2 hold   ASSESSMENT:   CLINICAL IMPRESSION: Pt exhibits core weakness and ability to tolerate all exercises without increasing her low back and right hip pain. She will continue to benefit from skilled PT to resolve these  aforementioned deficits to return to recreational activities and to sitting for prolonged periods of times for her job.        OBJECTIVE IMPAIRMENTS decreased strength, impaired flexibility, and pain.    ACTIVITY LIMITATIONS sitting and squatting    PARTICIPATION LIMITATIONS: driving, occupation, yard work, and recreational activity    [place] Age, Sex, and 1-2 comorbidities: hyperglycemia and h/o cancer  are also affecting patient's functional outcome.    REHAB POTENTIAL: Good   CLINICAL DECISION MAKING: Stable/uncomplicated   EVALUATION COMPLEXITY: Low     GOALS: Goals reviewed with patient? No   SHORT TERM GOALS: Target date: 12/29/2021   Pt will be independent with HEP in order to improve strength and balance in order to decrease fall risk and improve function at home and work. Baseline: NT Goal status: INITIAL       LONG TERM GOALS: Target date: 02/09/2022   Patient will have improved function and activity level as evidenced by an increase in FOTO score by 10 points or more.  Baseline: 60/74 Goal status: ONGOING    2. 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  Baseline: Hip R/L Add 4/4, Abd 4/4, Ext 4/4 Goal Status: ONGOING     3. Patient will demonstrate improved core strength as evidenced by maintaining 40 mmHg pressure during stabilizer biofeedback measurement of TA in supine and with leg loading.  Baseline: 12/19/21: 45 mmHg TA in Supine and 80 mmHg for leg loading  Goal status: ONGOING        PLAN: PT FREQUENCY: 1-2x/week   PT DURATION: 10 weeks   PLANNED INTERVENTIONS: Therapeutic exercises, Therapeutic activity, Neuromuscular re-education, Balance training, Gait training, Patient/Family education, Joint manipulation, Joint mobilization, Stair training, DME instructions, Aquatic Therapy, Dry Needling, Electrical stimulation, Cryotherapy, Moist heat, Traction, Manual therapy, and Re-evaluation.   PLAN FOR NEXT SESSION: Ask about home info, Finish Hip MMT, Joint Play of Spine, Core and Hip Strengthening, Single Leg Stance, Balance Testing?    Bradly Chris PT, DPT  12/19/2021, 11:04 AM

## 2021-12-22 ENCOUNTER — Encounter: Payer: Self-pay | Admitting: Physical Therapy

## 2021-12-22 ENCOUNTER — Ambulatory Visit: Payer: 59 | Admitting: Physical Therapy

## 2021-12-22 DIAGNOSIS — M5459 Other low back pain: Secondary | ICD-10-CM

## 2021-12-22 NOTE — Therapy (Signed)
OUTPATIENT PHYSICAL THERAPY TREATMENT NOTE   Patient Name: Lori Guerrero MRN: [id number] DOB:Nov 10, 1950, 71 y.o., female Today's Date: 12/22/2021  PCP: Dr. Einar Pheasant  REFERRING PROVIDER: Dr. Einar Pheasant   END OF SESSION:   PT End of Session - 12/22/21 1021     Visit Number 3    Number of Visits 20    Date for PT Re-Evaluation 02/09/22    Authorization Type [employer]    Authorization - Visit Number 3    Authorization - Number of Visits 60    Progress Note Due on Visit 10    PT Start Time 1015    PT Stop Time 1100    PT Time Calculation (min) 45 min    Activity Tolerance Patient tolerated treatment well    Behavior During Therapy WFL for tasks assessed/performed             Past Medical History:  Diagnosis Date   GERD (gastroesophageal reflux disease)    Hyperglycemia    Hyperplastic colon polyp    Skin cancer    Vitamin D deficiency    Past Surgical History:  Procedure Laterality Date   BREAST BIOPSY     25 years ago   MOHS SURGERY     skin cancer   Patient Active Problem List   Diagnosis Date Noted   Hip pain, right 11/21/2021   Nasal lesion 08/29/2021   Tail bone pain 08/07/2021   Foot pain, right 05/02/2021   Abnormal CXR 01/18/2021   Cough 05/18/2020   Blue toes 12/06/2019   Toe pain 11/16/2019   SOB (shortness of breath) on exertion 10/18/2019   Dizziness 10/18/2019   Back pain 06/30/2019   Breast asymmetry 05/09/2019   Right arm pain 04/23/2019   Skin lesions 04/23/2019   Right upper quadrant abdominal pain 05/22/2018   Nasal congestion 08/04/2017   Cerumen impaction 08/04/2017   Stress 04/05/2015   Health care maintenance 04/05/2015   Lower abdominal pain 04/14/2013   Hyperglycemia 08/13/2012   GERD (gastroesophageal reflux disease) 08/13/2012   History of colonic polyps 08/13/2012   Vitamin D deficiency 08/13/2012   Skin cancer 08/13/2012    REFERRING DIAG:  M54.89 (ICD-10-CM) - Midline back pain, unspecified back location,  unspecified chronicity M25.551 (ICD-10-CM) - Hip pain, right  THERAPY DIAG:  Other low back pain  Rationale for Evaluation and Treatment Rehabilitation  PERTINENT HISTORY:   Dr. Einar Pheasant per 11/21/21     HPI Here to follow up regarding blood sugar, reflux and back pain.  Had on boots and apparently caught - fell 09/17/21.  Right side pain/right arm sore.  Better.  Left lower back pain/left side discomfort.  No radiatio of pain.  Tries massage therapist.  Hurts to sit.  Walking - no pain.  No chest pain or sob reported.  No cough or congestion.  No abdominal pain.  Bowels moving.   Back pain - Primary                           Increased back pain - low back pain and left side pain.  Given recent fall.  Persistent.  Check xray.  PRECAUTIONS: None   SUBJECTIVE: Pt reports increased pain and stiffness from sitting for a long period of time throughout day over past two days for work.   PAIN:  Are you having pain? Yes: NPRS scale: 3/10 Pain location: Right hip  Pain description: Achy Aggravating factors: Sitting for prolonged  periods of time  Relieving factors: walking around    OBJECTIVE: (objective measures completed at initial evaluation unless otherwise dated)  VITALS:  In sitting                           BP   115/49 HR 68 SpO2 98   DIAGNOSTIC FINDINGS:  CLINICAL DATA:  Low back pain.   EXAM: LUMBAR SPINE - 2-3 VIEW   COMPARISON:  CT abdomen pelvis dated 11/06/2019.   FINDINGS: Five lumbar type vertebra. There is no acute fracture or subluxation of the lumbar spine. Multilevel degenerative changes with disc space narrowing and spurring and mild dextroscoliosis. Multilevel facet arthropathy. The visualized posterior elements are intact. The soft tissues are unremarkable.   IMPRESSION: No acute findings.     Electronically Signed   By: Anner Crete M.D.   On: 11/21/2021 22:38 ////////////////////////////////////////////////////////// CLINICAL DATA:   Fall and right hip pain.   EXAM: DG HIP (WITH OR WITHOUT PELVIS) 2-3V RIGHT   COMPARISON:  None Available.   FINDINGS: There is no evidence of hip fracture or dislocation. There is no evidence of arthropathy or other focal bone abnormality.   IMPRESSION: Negative.     Electronically Signed   By: Anner Crete M.D.   On: 11/21/2021 22:37   PATIENT SURVEYS:  FOTO     SCREENING FOR RED FLAGS: Bowel or bladder incontinence: No Spinal tumors: No Cauda equina syndrome: No Compression fracture: No Abdominal aneurysm: No   COGNITION:           Overall cognitive status: Within functional limits for tasks assessed                          SENSATION: WFL   MUSCLE LENGTH: Hamstrings: Right 80 deg; Left 80 deg Thomas test: Right 0 deg; Left 0 deg   POSTURE: No Significant postural limitations   PALPATION: Right ribs and T10-T12 spinous processes    LUMBAR ROM:    Active  A/PROM  eval  Flexion 100%  Extension 100%  Right lateral flexion 100%*  Left lateral flexion 100%*  Right rotation 100%  Left rotation 100%   (Blank rows = not tested)   LOWER EXTREMITY ROM:            Active  Right Left    Hip flexion 120 120  Hip extension 30 30  Hip abduction 45 45  Hip adduction 30 30  Hip internal rotation 45 45  Hip external rotation 45 45  Knee flexion 135 135  Knee extension 0 0  Ankle dorsiflexion 20 20  Ankle plantarflexion 50 50  Ankle inversion      Ankle eversion       (Blank rows = not tested)        LOWER EXTREMITY MMT:     MMT Right eval Left eval  Hip flexion 5 5  Hip extension 4 4  Hip abduction 4 4  Hip adduction 3+ 3+  Hip internal rotation      Hip external rotation      Knee flexion 5 5  Knee extension 5 5  Ankle dorsiflexion 5 5  Ankle plantarflexion      Ankle inversion      Ankle eversion       (Blank rows = not tested)   LUMBAR SPECIAL TESTS:  Straight leg raise test: Negative, SI Compression/distraction test:  Positive on RLE, FABER  test: Negative, and Thomas test: Negative and FADIR negative    FUNCTIONAL TESTS:  None performed    GAIT: Distance walked: 25 ft  Assistive device utilized: None Level of assistance: Complete Independence Comments: No gait deficits noted        TODAY'S TREATMENT   12/22/21           Nu-Step Seat and Arms 7 Level 2 Resistance            Lower Trunk Rotation 3 x 10            Supine Bridges with ball squeeze 3 x 10            Leg Lifts with Bent Knees 1 x 5 x 5 sec            Cat Cow 1 x 5 with 3 sec holds                12/19/21             TM 2.5 mph 5 min               Hip Adductor Isometric 5 sec hold 3 x 10               Supine Bridges 3 x 10                Supine TA Activation with BP cuff for 10 sec hold x 10                          Dead Bugs 2 x 10   Initial  Lower Trunk Rotation 3 x 10  Seated Hamstring Stretch 2 x 30 sec      PATIENT EDUCATION:  Education details: form and function and explanation about deficits  Person educated: Patient Education method: Consulting civil engineer, Media planner, Verbal cues, and Handouts Education comprehension: verbalized understanding, returned demonstration, and verbal cues required     HOME EXERCISE PROGRAM: Access Code: [email or web address] URL: [email or web address] Date: 12/22/2021 Prepared by: Bradly Chris  Exercises - Seated Hamstring Stretch  - 1 x daily - 7 x weekly - 1 sets - 3 reps - 60 hold - Supine Lower Trunk Rotation  - 1 x daily - 7 x weekly - 3 sets - 10 reps - Supine Bridge with Mini Swiss Ball Between Knees  - 1 x daily - 3 x weekly - 3 sets - 10 reps - Supine Bent Leg Lift with Knee Extension  - 1 x daily - 7 x weekly - 1 sets - 5 reps - 5 hold - Cat Cow  - 1 x daily - 7 x weekly - 1 sets - 5 reps - 3 hold   ASSESSMENT:   CLINICAL IMPRESSION: Despite presenting with increased pain and stiffness at beginning of session, pt able to complete all exercises without an increase in her  pain. Abdominal exercise with leg loading modified to include bent knee so that pt could complete without compensation. PT recommended that pt move every 30 to 45 min to avoid muscle stiffening and joint pain. She will continue to benefit from skilled PT to resolve these  aforementioned deficits to return to recreational activities and to sitting for prolonged periods of times for her job.      OBJECTIVE IMPAIRMENTS decreased strength, impaired flexibility, and pain.    ACTIVITY LIMITATIONS sitting and squatting   PARTICIPATION LIMITATIONS: driving, occupation, yard work, and recreational activity  PERSONAL FACTORS Age, Sex, and 1-2 comorbidities: hyperglycemia and h/o cancer  are also affecting patient's functional outcome.    REHAB POTENTIAL: Good   CLINICAL DECISION MAKING: Stable/uncomplicated   EVALUATION COMPLEXITY: Low     GOALS: Goals reviewed with patient? No   SHORT TERM GOALS: Target date: 12/29/2021   Pt will be independent with HEP in order to improve strength and balance in order to decrease fall risk and improve function at home and work. Baseline: NT Goal status: INITIAL       LONG TERM GOALS: Target date: 02/09/2022   Patient will have improved function and activity level as evidenced by an increase in FOTO score by 10 points or more.  Baseline: 60/74 Goal status: ONGOING    2. 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  Baseline: Hip R/L Add 4/4, Abd 4/4, Ext 4/4 Goal Status: ONGOING     3. Patient will demonstrate improved core strength as evidenced by maintaining 40 mmHg pressure during stabilizer biofeedback measurement of TA in supine and with leg loading.  Baseline: 12/19/21: 45 mmHg TA in Supine and 80 mmHg for leg loading  Goal status: ONGOING        PLAN: PT FREQUENCY: 1-2x/week   PT  DURATION: 10 weeks   PLANNED INTERVENTIONS: Therapeutic exercises, Therapeutic activity, Neuromuscular re-education, Balance training, Gait training, Patient/Family education, Joint manipulation, Joint mobilization, Stair training, DME instructions, Aquatic Therapy, Dry Needling, Electrical stimulation, Cryotherapy, Moist heat, Traction, Manual therapy, and Re-evaluation.   PLAN FOR NEXT SESSION: Ask about home info,Finish Hip MMT, Joint Play of Spine, Core and Hip Strengthening, Single Leg Stance, Balance Testing?   Bradly Chris PT, DPT  12/22/2021, 10:22 AM

## 2021-12-27 ENCOUNTER — Ambulatory Visit: Payer: 59 | Admitting: Physical Therapy

## 2021-12-27 ENCOUNTER — Encounter: Payer: Self-pay | Admitting: Physical Therapy

## 2021-12-27 DIAGNOSIS — M5459 Other low back pain: Secondary | ICD-10-CM | POA: Diagnosis not present

## 2021-12-29 ENCOUNTER — Ambulatory Visit: Payer: 59 | Admitting: Physical Therapy

## 2022-01-02 ENCOUNTER — Ambulatory Visit: Payer: 59 | Admitting: Physical Therapy

## 2022-01-02 ENCOUNTER — Telehealth: Payer: Self-pay | Admitting: Physical Therapy

## 2022-01-02 NOTE — Telephone Encounter (Signed)
Called pt to inquiry about absence. Pt reports that she cancelled today's visit at her last apt. She cannot make her next two appointments due to scheduling conflicts and she would like to reschedule one of them for the 12th at 4:30 pm.

## 2022-01-05 ENCOUNTER — Ambulatory Visit: Payer: 59 | Admitting: Physical Therapy

## 2022-01-05 ENCOUNTER — Telehealth: Payer: Self-pay | Admitting: Physical Therapy

## 2022-01-05 NOTE — Telephone Encounter (Signed)
Called pt to confirm that she would be coming to apt. She said that she had requested for apt to be removed. PT confirmed next apt with pt and she was aware of date and time.

## 2022-01-09 ENCOUNTER — Encounter: Payer: 59 | Admitting: Physical Therapy

## 2022-01-11 ENCOUNTER — Ambulatory Visit: Payer: 59 | Admitting: Physical Therapy

## 2022-01-12 ENCOUNTER — Encounter: Payer: 59 | Admitting: Physical Therapy

## 2022-01-16 ENCOUNTER — Telehealth: Payer: Self-pay | Admitting: Physical Therapy

## 2022-01-16 ENCOUNTER — Ambulatory Visit: Payer: 59 | Admitting: Physical Therapy

## 2022-01-16 NOTE — Telephone Encounter (Signed)
Talked to patient who reports that her mother has gotten sick suddenly and that she needs to go home to care for her.

## 2022-01-18 ENCOUNTER — Ambulatory Visit: Payer: 59 | Admitting: Physical Therapy

## 2022-01-23 ENCOUNTER — Ambulatory Visit: Payer: 59 | Admitting: Physical Therapy

## 2022-01-25 ENCOUNTER — Ambulatory Visit: Payer: 59 | Admitting: Physical Therapy

## 2022-02-28 ENCOUNTER — Ambulatory Visit: Payer: 59 | Admitting: Internal Medicine

## 2022-02-28 ENCOUNTER — Other Ambulatory Visit: Payer: 59

## 2022-03-01 ENCOUNTER — Ambulatory Visit: Payer: 59 | Admitting: Internal Medicine

## 2022-03-20 ENCOUNTER — Telehealth: Payer: Self-pay | Admitting: Internal Medicine

## 2022-03-20 NOTE — Telephone Encounter (Signed)
Pt came into office to drop off medical clearance form to be filled out. Placed in provider folder up front

## 2022-03-20 NOTE — Telephone Encounter (Signed)
Form has been placed in you quick sign folder

## 2022-03-21 NOTE — Telephone Encounter (Signed)
Lm on vm to see if patient could come in today at 4pm or do a virtual / telephone visit with Dr Nicki Reaper. Need to review paper work.

## 2022-03-21 NOTE — Telephone Encounter (Signed)
Form reviewed.  I can work her in 4:00 today for evaluation to complete form.  Ok if virtual/telephone.  Just need to clarify a few things.

## 2022-03-21 NOTE — Telephone Encounter (Signed)
4pm appointment has been taken. Dr Nicki Reaper will find another spot and office will call her.

## 2022-03-27 NOTE — Telephone Encounter (Signed)
Pt called stating she would like for someone to call her in regards to a medical clearance form. Pt states she does not have insurance and cannot afford an appointment. Pt would like to be called

## 2022-03-28 NOTE — Telephone Encounter (Signed)
The form was asking for clearance for activity, etc.  Last visit, she was having back /hip pain, etc.  Need to know if having any persistent issues that would affect her walking, exercise, etc.  Confirm no other acute issues - no chest pain, sob, etc.

## 2022-03-28 NOTE — Telephone Encounter (Signed)
Lvm for pt to return call in regards to pt's medical clearance form. See Dr.Scott's note.

## 2022-03-30 NOTE — Telephone Encounter (Signed)
I called and spoke with the patient about the questions the provider needed to ask in reference to her clearance form.  The patient stated she is not having any persistent issues in walking or exercise , she wanted the provider to know that she walked 15,000 steps on yesterday with no issues and she wanted the provider to know that she also has retired form employment.  Patient stated no SOB, chest pain or any acute issues as well. Elizeo Rodriques,cma

## 2022-03-31 NOTE — Telephone Encounter (Signed)
Signed and placed in box at [facility]

## 2022-03-31 NOTE — Telephone Encounter (Signed)
Patient called about form, note was read. Patient stated, thank you.

## 2022-03-31 NOTE — Telephone Encounter (Signed)
Form faxed. Confirmation given.  Bettina Warn,cma

## 2022-04-11 ENCOUNTER — Other Ambulatory Visit (INDEPENDENT_AMBULATORY_CARE_PROVIDER_SITE_OTHER): Payer: Medicare PPO

## 2022-04-11 DIAGNOSIS — K219 Gastro-esophageal reflux disease without esophagitis: Secondary | ICD-10-CM

## 2022-04-11 DIAGNOSIS — R739 Hyperglycemia, unspecified: Secondary | ICD-10-CM | POA: Diagnosis not present

## 2022-04-11 DIAGNOSIS — D72819 Decreased white blood cell count, unspecified: Secondary | ICD-10-CM | POA: Diagnosis not present

## 2022-04-11 LAB — CBC WITH DIFFERENTIAL/PLATELET
Basophils Absolute: 0.1 10*3/uL (ref 0.0–0.1)
Basophils Relative: 1.5 % (ref 0.0–3.0)
Eosinophils Absolute: 0.1 10*3/uL (ref 0.0–0.7)
Eosinophils Relative: 2.6 % (ref 0.0–5.0)
HCT: 41.1 % (ref 36.0–46.0)
Hemoglobin: 14.2 g/dL (ref 12.0–15.0)
Lymphocytes Relative: 25.7 % (ref 12.0–46.0)
Lymphs Abs: 1.1 10*3/uL (ref 0.7–4.0)
MCHC: 34.5 g/dL (ref 30.0–36.0)
MCV: 92.7 fl (ref 78.0–100.0)
Monocytes Absolute: 0.3 10*3/uL (ref 0.1–1.0)
Monocytes Relative: 6.5 % (ref 3.0–12.0)
Neutro Abs: 2.6 10*3/uL (ref 1.4–7.7)
Neutrophils Relative %: 63.7 % (ref 43.0–77.0)
Platelets: 241 10*3/uL (ref 150.0–400.0)
RBC: 4.44 Mil/uL (ref 3.87–5.11)
RDW: 13 % (ref 11.5–15.5)
WBC: 4.1 10*3/uL (ref 4.0–10.5)

## 2022-04-11 LAB — LIPID PANEL
Cholesterol: 192 mg/dL (ref 0–200)
HDL: 47.9 mg/dL (ref >39.00)
LDL Cholesterol: 125 mg/dL — ABNORMAL HIGH (ref 0–99)
NonHDL: 144.16
Total CHOL/HDL Ratio: 4
Triglycerides: 94 mg/dL (ref 0.0–149.0)
VLDL: 18.8 mg/dL (ref 0.0–40.0)

## 2022-04-11 LAB — BASIC METABOLIC PANEL
BUN: 19 mg/dL (ref 6–23)
CO2: 23 mEq/L (ref 19–32)
Calcium: 9.2 mg/dL (ref 8.4–10.5)
Chloride: 106 mEq/L (ref 96–112)
Creatinine, Ser: 0.9 mg/dL (ref 0.40–1.20)
GFR: 64.94 mL/min (ref >60.00)
Glucose, Bld: 120 mg/dL — ABNORMAL HIGH (ref 70–99)
Potassium: 4.1 mEq/L (ref 3.5–5.1)
Sodium: 140 mEq/L (ref 135–145)

## 2022-04-11 LAB — HEPATIC FUNCTION PANEL
ALT: 18 U/L (ref 0–35)
AST: 19 U/L (ref 0–37)
Albumin: 4 g/dL (ref 3.5–5.2)
Alkaline Phosphatase: 109 U/L (ref 39–117)
Bilirubin, Direct: 0.1 mg/dL (ref 0.0–0.3)
Total Bilirubin: 0.6 mg/dL (ref 0.2–1.2)
Total Protein: 6.4 g/dL (ref 6.0–8.3)

## 2022-04-11 LAB — HEMOGLOBIN A1C: Hgb A1c MFr Bld: 6 % (ref 4.6–6.5)

## 2022-04-13 ENCOUNTER — Ambulatory Visit (INDEPENDENT_AMBULATORY_CARE_PROVIDER_SITE_OTHER): Payer: Medicare PPO

## 2022-04-13 ENCOUNTER — Encounter: Payer: Self-pay | Admitting: Internal Medicine

## 2022-04-13 ENCOUNTER — Ambulatory Visit (INDEPENDENT_AMBULATORY_CARE_PROVIDER_SITE_OTHER): Payer: Medicare PPO | Admitting: Internal Medicine

## 2022-04-13 VITALS — BP 130/70 | HR 70 | Temp 98.1°F | Ht 63.0 in | Wt 180.2 lb

## 2022-04-13 DIAGNOSIS — R062 Wheezing: Secondary | ICD-10-CM | POA: Insufficient documentation

## 2022-04-13 DIAGNOSIS — R739 Hyperglycemia, unspecified: Secondary | ICD-10-CM

## 2022-04-13 DIAGNOSIS — E7800 Pure hypercholesterolemia, unspecified: Secondary | ICD-10-CM | POA: Diagnosis not present

## 2022-04-13 DIAGNOSIS — F439 Reaction to severe stress, unspecified: Secondary | ICD-10-CM

## 2022-04-13 DIAGNOSIS — Z86010 Personal history of colonic polyps: Secondary | ICD-10-CM

## 2022-04-13 DIAGNOSIS — C4490 Unspecified malignant neoplasm of skin, unspecified: Secondary | ICD-10-CM

## 2022-04-13 DIAGNOSIS — K219 Gastro-esophageal reflux disease without esophagitis: Secondary | ICD-10-CM | POA: Diagnosis not present
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
## 2022-04-13 NOTE — Assessment & Plan Note (Signed)
The 10-year ASCVD risk score (Arnett DK, et al., 2019) is: 7.9%   Values used to calculate the score:     Age: 71 years     Sex: Female     Is Non-Hispanic African American: No     Diabetic: No     Tobacco smoker: No     Systolic Blood Pressure: 677 mmHg     Is BP treated: No     HDL Cholesterol: 47.9 mg/dL     Total Cholesterol: 192 mg/dL  Discussed labs.  Discussed calculated cholesterol risk and recommendation to start cholesterol medication.  Low cholesterol diet and exercise.  Follow lipid panel.

## 2022-04-14 ENCOUNTER — Other Ambulatory Visit: Payer: Self-pay

## 2022-04-14 DIAGNOSIS — R9389 Abnormal findings on diagnostic imaging of other specified body structures: Secondary | ICD-10-CM

## 2022-04-14 DIAGNOSIS — R062 Wheezing: Secondary | ICD-10-CM

## 2022-04-14 MED ORDER — AMOXICILLIN-POT CLAVULANATE 875-125 MG PO TABS: 1.0000 | ORAL_TABLET | Freq: Two times a day (BID) | ORAL | 0 refills | Status: DC

## 2022-04-14 MED ORDER — AZITHROMYCIN 250 MG PO TABS: ORAL_TABLET | ORAL | 0 refills | Status: AC

## 2022-04-17 ENCOUNTER — Encounter: Payer: Self-pay | Admitting: Internal Medicine

## 2022-04-17 NOTE — Assessment & Plan Note (Signed)
Had f/u colonoscopy 10/2019.

## 2022-04-17 NOTE — Assessment & Plan Note (Signed)
Continue PPI ?

## 2022-04-17 NOTE — Assessment & Plan Note (Signed)
Low carb diet and exercise.  Follow met b and a1c.  

## 2022-04-17 NOTE — Assessment & Plan Note (Signed)
With cough and congestion and her notice of intermittent wheezing, will check cxr.  Mucinex/robitussin DM if needed.  Further w/up and treatment pending results of cxr.

## 2022-04-17 NOTE — Assessment & Plan Note (Signed)
States she was diagnosed with melanoma.  Request referral to local dermatology.  Has been followed by [facility].

## 2022-04-17 NOTE — Assessment & Plan Note (Signed)
Overall appears to be handling things relatively well.  Follow.   

## 2022-04-20 ENCOUNTER — Encounter: Payer: Self-pay | Admitting: Internal Medicine

## 2022-04-27 ENCOUNTER — Other Ambulatory Visit: Payer: Self-pay | Admitting: Internal Medicine

## 2022-05-03 ENCOUNTER — Encounter: Payer: Self-pay | Admitting: Internal Medicine

## 2022-05-03 LAB — URINALYSIS, ROUTINE W REFLEX MICROSCOPIC
Bilirubin Urine: NEGATIVE
Hgb urine dipstick: NEGATIVE
Ketones, ur: NEGATIVE
Nitrite: NEGATIVE
RBC / HPF: NONE SEEN (ref 0–?)
Specific Gravity, Urine: 1.01 (ref 1.000–1.030)
Total Protein, Urine: NEGATIVE
Urine Glucose: NEGATIVE
Urobilinogen, UA: 0.2 (ref 0.0–1.0)
pH: 6 (ref 5.0–8.0)

## 2022-05-04 NOTE — Telephone Encounter (Signed)
Will be calling pt w/ result note from dr Nicki Reaper

## 2022-05-11 ENCOUNTER — Ambulatory Visit (INDEPENDENT_AMBULATORY_CARE_PROVIDER_SITE_OTHER): Payer: Medicare PPO

## 2022-05-11 ENCOUNTER — Other Ambulatory Visit: Payer: Medicare PPO

## 2022-05-11 DIAGNOSIS — R062 Wheezing: Secondary | ICD-10-CM | POA: Diagnosis not present

## 2022-05-11 DIAGNOSIS — R9389 Abnormal findings on diagnostic imaging of other specified body structures: Secondary | ICD-10-CM | POA: Diagnosis not present

## 2022-05-11 DIAGNOSIS — J189 Pneumonia, unspecified organism: Secondary | ICD-10-CM | POA: Diagnosis not present

## 2022-05-16 ENCOUNTER — Other Ambulatory Visit: Payer: Medicare PPO

## 2022-05-17 ENCOUNTER — Telehealth: Payer: Self-pay

## 2022-05-17 NOTE — Telephone Encounter (Signed)
LM FOR PT TO CB RE :  Notify Lori Guerrero - cxr reveals the previous changes in the right lung - resolved.  No acute abnormality seen.

## 2022-05-17 NOTE — Telephone Encounter (Signed)
Pt called back and I read the message to her and she verbalized understanding 

## 2022-06-06 DIAGNOSIS — Z85820 Personal history of malignant melanoma of skin: Secondary | ICD-10-CM | POA: Diagnosis not present

## 2022-06-06 DIAGNOSIS — L309 Dermatitis, unspecified: Secondary | ICD-10-CM | POA: Diagnosis not present

## 2022-06-06 DIAGNOSIS — L408 Other psoriasis: Secondary | ICD-10-CM | POA: Diagnosis not present

## 2022-06-06 DIAGNOSIS — Z85828 Personal history of other malignant neoplasm of skin: Secondary | ICD-10-CM | POA: Diagnosis not present

## 2022-06-06 DIAGNOSIS — D225 Melanocytic nevi of trunk: Secondary | ICD-10-CM | POA: Diagnosis not present

## 2022-06-15 ENCOUNTER — Other Ambulatory Visit: Payer: Self-pay | Admitting: Internal Medicine

## 2022-06-15 DIAGNOSIS — Z1231 Encounter for screening mammogram for malignant neoplasm of breast: Secondary | ICD-10-CM

## 2022-07-05 DIAGNOSIS — L309 Dermatitis, unspecified: Secondary | ICD-10-CM | POA: Diagnosis not present

## 2022-07-05 DIAGNOSIS — L820 Inflamed seborrheic keratosis: Secondary | ICD-10-CM | POA: Diagnosis not present

## 2022-07-05 DIAGNOSIS — L84 Corns and callosities: Secondary | ICD-10-CM | POA: Diagnosis not present

## 2022-07-05 DIAGNOSIS — L814 Other melanin hyperpigmentation: Secondary | ICD-10-CM | POA: Diagnosis not present

## 2022-07-05 DIAGNOSIS — Z08 Encounter for follow-up examination after completed treatment for malignant neoplasm: Secondary | ICD-10-CM | POA: Diagnosis not present

## 2022-07-05 DIAGNOSIS — Z85828 Personal history of other malignant neoplasm of skin: Secondary | ICD-10-CM | POA: Diagnosis not present

## 2022-07-05 DIAGNOSIS — Z7189 Other specified counseling: Secondary | ICD-10-CM | POA: Diagnosis not present

## 2022-07-05 DIAGNOSIS — D225 Melanocytic nevi of trunk: Secondary | ICD-10-CM | POA: Diagnosis not present

## 2022-07-05 DIAGNOSIS — Z86006 Personal history of melanoma in-situ: Secondary | ICD-10-CM | POA: Diagnosis not present

## 2022-07-12 ENCOUNTER — Ambulatory Visit
Admission: RE | Admit: 2022-07-12 | Discharge: 2022-07-12 | Disposition: A | Discharge status: Home / Self Care | Payer: Medicare PPO | Source: Ambulatory Visit | Attending: Internal Medicine | Admitting: Internal Medicine

## 2022-07-12 DIAGNOSIS — Z1231 Encounter for screening mammogram for malignant neoplasm of breast: Secondary | ICD-10-CM

## 2022-07-12 DIAGNOSIS — H2513 Age-related nuclear cataract, bilateral: Secondary | ICD-10-CM | POA: Diagnosis not present

## 2022-07-12 LAB — HM DIABETES EYE EXAM

## 2022-08-21 ENCOUNTER — Other Ambulatory Visit: Payer: Medicare PPO

## 2022-08-25 ENCOUNTER — Encounter: Payer: Medicare PPO | Admitting: Internal Medicine

## 2022-08-31 ENCOUNTER — Ambulatory Visit (INDEPENDENT_AMBULATORY_CARE_PROVIDER_SITE_OTHER): Payer: Medicare PPO | Admitting: Nurse Practitioner

## 2022-08-31 ENCOUNTER — Encounter: Payer: Self-pay | Admitting: Nurse Practitioner

## 2022-08-31 ENCOUNTER — Ambulatory Visit: Payer: Medicare PPO | Admitting: Nurse Practitioner

## 2022-08-31 VITALS — BP 140/78 | HR 85 | Temp 98.2°F | Ht 63.0 in | Wt 180.2 lb

## 2022-08-31 DIAGNOSIS — J018 Other acute sinusitis: Secondary | ICD-10-CM | POA: Diagnosis not present

## 2022-08-31 MED ORDER — PREDNISONE 20 MG PO TABS: ORAL_TABLET | ORAL | 0 refills | Status: DC

## 2022-08-31 MED ORDER — AMOXICILLIN 500 MG PO TABS: 500.0000 mg | ORAL_TABLET | Freq: Two times a day (BID) | ORAL | 0 refills | Status: AC

## 2022-08-31 NOTE — Patient Instructions (Signed)
Medication sent at the pharmacy. Increase hydration.

## 2022-08-31 NOTE — Progress Notes (Signed)
Established Patient Office Visit  Subjective:  Patient ID: Lori Guerrero, female    DOB: 10/06/1950  Age: 72 y.o. MRN: [id number]  CC:  Chief Complaint  Patient presents with   Acute Visit    Sinus pressure & congestion, right ear pain & coughing when laying down since 08/19/22    HPI  Lori Guerrero presents for:  Sinus Problem This is a new problem. The current episode started in the past 7 days. The problem is unchanged. There has been no fever. Associated symptoms include coughing, ear pain, headaches, a hoarse voice and a sore throat. Pertinent negatives include no chills or shortness of breath. Past treatments include oral decongestants (and claritin).    Past Medical History:  Diagnosis Date   GERD (gastroesophageal reflux disease)    Hyperglycemia    Hyperplastic colon polyp    Skin cancer    Vitamin D deficiency     Past Surgical History:  Procedure Laterality Date   BREAST BIOPSY     25 years ago   MOHS SURGERY     skin cancer    Family History  Problem Relation Age of Onset   Heart disease Mother    Cancer Paternal Grandfather        colon   Cancer Paternal Aunt        Breast cancer   Breast cancer Paternal Aunt 68   Breast cancer Maternal Aunt    Diabetes Maternal Grandmother     Social History   Socioeconomic History   Marital status: Widowed    Spouse name: Not on file   Number of children: 1   Years of education: Not on file   Highest education level: Not on file  Occupational History   Not on file  Tobacco Use   Smoking status: Never   Smokeless tobacco: Never  Vaping Use   Vaping Use: Never used  Substance and Sexual Activity   Alcohol use: Yes    Alcohol/week: 0.0 standard drinks of alcohol    Comment: occassionally   Drug use: No   Sexual activity: Not Currently    Comment: 1st intercourse- 19, partners- 2, widow   Other Topics Concern   Not on file  Social History Narrative   Not on file   Social Determinants of Health    Financial Resource Strain: Not on file  Food Insecurity: Not on file  Transportation Needs: Not on file  Physical Activity: Not on file  Stress: Not on file  Social Connections: Not on file  Intimate Partner Violence: Not on file     Outpatient Medications Prior to Visit  Medication Sig Dispense Refill   esomeprazole (NEXIUM) 20 MG capsule Take 20 mg by mouth daily at 12 noon.     famotidine (PEPCID) 20 MG tablet Take 1 tablet (20 mg total) by mouth daily before supper. 90 tablet 3   Multiple Vitamins-Minerals (MULTIVITAMIN PO) Take by mouth daily.     amoxicillin-clavulanate (AUGMENTIN) 875-125 MG tablet Take 1 tablet by mouth 2 (two) times daily. 20 tablet 0   No facility-administered medications prior to visit.    Allergies  Allergen Reactions   Septra [Sulfamethoxazole-Trimethoprim]     ROS Review of Systems  Constitutional:  Negative for chills.  HENT:  Positive for ear pain, hoarse voice and sore throat.   Respiratory:  Positive for cough. Negative for shortness of breath.   Neurological:  Positive for headaches.      Objective:    Physical  Exam Constitutional:      Appearance: Normal appearance.  HENT:     Right Ear: Tympanic membrane normal.     Left Ear: Tympanic membrane normal.     Nose:     Right Turbinates: Not enlarged or swollen.     Left Turbinates: Not enlarged or swollen.     Right Sinus: Maxillary sinus tenderness and frontal sinus tenderness present.     Left Sinus: Maxillary sinus tenderness and frontal sinus tenderness present.     Mouth/Throat:     Mouth: Mucous membranes are moist.  Neurological:     Mental Status: She is alert.     BP (!) 140/78   Pulse 85   Temp 98.2 F (36.8 C)   Ht '5\' 3"'$  (1.6 m)   Wt 180 lb 3.2 oz (81.7 kg)   SpO2 98%   BMI 31.92 kg/m  Wt Readings from Last 3 Encounters:  08/31/22 180 lb 3.2 oz (81.7 kg)  04/13/22 180 lb 3.2 oz (81.7 kg)  11/21/21 178 lb 9.6 oz (81 kg)     Health Maintenance  Topic  Date Due   Medicare Annual Wellness (AWV)  Never done   Zoster Vaccines- Shingrix (1 of 2) Never done   Pneumonia Vaccine 10+ Years old (1 of 1 - PCV) Never done   COVID-19 Vaccine (3 - Pfizer risk series) 09/15/2022 (Originally 10/14/2019)   INFLUENZA VACCINE  10/01/2022 (Originally 01/31/2022)   MAMMOGRAM  07/13/2023   DTaP/Tdap/Td (2 - Td or Tdap) 06/23/2024   COLONOSCOPY (Pts 45-33yr Insurance coverage will need to be confirmed)  07/25/2026   DEXA SCAN  Completed   Hepatitis C Screening  Completed   HPV VACCINES  Aged Out    There are no preventive care reminders to display for this patient.  Lab Results  Component Value Date   TSH 2.11 07/27/2021   Lab Results  Component Value Date   WBC 4.1 04/11/2022   HGB 14.2 04/11/2022   HCT 41.1 04/11/2022   MCV 92.7 04/11/2022   PLT 241.0 04/11/2022   Lab Results  Component Value Date   NA 140 04/11/2022   K 4.1 04/11/2022   CO2 23 04/11/2022   GLUCOSE 120 (H) 04/11/2022   BUN 19 04/11/2022   CREATININE 0.90 04/11/2022   BILITOT 0.6 04/11/2022   ALKPHOS 109 04/11/2022   AST 19 04/11/2022   ALT 18 04/11/2022   PROT 6.4 04/11/2022   ALBUMIN 4.0 04/11/2022   CALCIUM 9.2 04/11/2022   GFR 64.94 04/11/2022   Lab Results  Component Value Date   CHOL 192 04/11/2022   Lab Results  Component Value Date   HDL 47.90 04/11/2022   Lab Results  Component Value Date   LDLCALC 125 (H) 04/11/2022   Lab Results  Component Value Date   TRIG 94.0 04/11/2022   Lab Results  Component Value Date   CHOLHDL 4 04/11/2022   Lab Results  Component Value Date   HGBA1C 6.0 04/11/2022      Assessment & Plan:  Other acute sinusitis, recurrence not specified Assessment & Plan: Started on amoxicillin 500 mg twice a day for 7 days and prednisone 20 mg 2 tablets daily for 5 days. Increase hydration and use humidifier.   Other orders -     Amoxicillin; Take 1 tablet (500 mg total) by mouth 2 (two) times daily for 7 days.  Dispense:  14 tablet; Refill: 0 -     predniSONE; Take 2 tablets with breakfast  for  5 days.  Dispense: 10 tablet; Refill: 0    Follow-up: No follow-ups on file.   Theresia Lo, NP

## 2022-09-05 DIAGNOSIS — J018 Other acute sinusitis: Secondary | ICD-10-CM | POA: Insufficient documentation

## 2022-09-05 NOTE — Assessment & Plan Note (Signed)
Started on amoxicillin 500 mg twice a day for 7 days and prednisone 20 mg 2 tablets daily for 5 days. Increase hydration and use humidifier.

## 2022-09-15 ENCOUNTER — Other Ambulatory Visit (INDEPENDENT_AMBULATORY_CARE_PROVIDER_SITE_OTHER): Payer: Medicare PPO

## 2022-09-15 DIAGNOSIS — R739 Hyperglycemia, unspecified: Secondary | ICD-10-CM | POA: Diagnosis not present

## 2022-09-15 DIAGNOSIS — E7800 Pure hypercholesterolemia, unspecified: Secondary | ICD-10-CM | POA: Diagnosis not present

## 2022-09-15 LAB — HEPATIC FUNCTION PANEL
ALT: 20 U/L (ref 0–35)
AST: 20 U/L (ref 0–37)
Albumin: 3.8 g/dL (ref 3.5–5.2)
Alkaline Phosphatase: 106 U/L (ref 39–117)
Bilirubin, Direct: 0.1 mg/dL (ref 0.0–0.3)
Total Bilirubin: 0.6 mg/dL (ref 0.2–1.2)
Total Protein: 6.3 g/dL (ref 6.0–8.3)

## 2022-09-15 LAB — HEMOGLOBIN A1C: Hgb A1c MFr Bld: 6.1 % (ref 4.6–6.5)

## 2022-09-15 LAB — BASIC METABOLIC PANEL
BUN: 18 mg/dL (ref 6–23)
CO2: 24 mEq/L (ref 19–32)
Calcium: 9.2 mg/dL (ref 8.4–10.5)
Chloride: 108 mEq/L (ref 96–112)
Creatinine, Ser: 0.89 mg/dL (ref 0.40–1.20)
GFR: 65.62 mL/min (ref >60.00)
Glucose, Bld: 124 mg/dL — ABNORMAL HIGH (ref 70–99)
Potassium: 3.9 mEq/L (ref 3.5–5.1)
Sodium: 140 mEq/L (ref 135–145)

## 2022-09-15 LAB — LIPID PANEL
Cholesterol: 191 mg/dL (ref 0–200)
HDL: 47.7 mg/dL (ref >39.00)
LDL Cholesterol: 120 mg/dL — ABNORMAL HIGH (ref 0–99)
NonHDL: 143.09
Total CHOL/HDL Ratio: 4
Triglycerides: 114 mg/dL (ref 0.0–149.0)
VLDL: 22.8 mg/dL (ref 0.0–40.0)

## 2022-09-18 ENCOUNTER — Encounter: Payer: Self-pay | Admitting: Internal Medicine

## 2022-09-20 ENCOUNTER — Ambulatory Visit (INDEPENDENT_AMBULATORY_CARE_PROVIDER_SITE_OTHER): Payer: Medicare PPO | Admitting: Internal Medicine

## 2022-09-20 ENCOUNTER — Telehealth: Payer: Self-pay | Admitting: Internal Medicine

## 2022-09-20 VITALS — BP 112/70 | HR 71 | Temp 98.0°F | Resp 16 | Ht 64.0 in | Wt 180.0 lb

## 2022-09-20 DIAGNOSIS — F439 Reaction to severe stress, unspecified: Secondary | ICD-10-CM

## 2022-09-20 DIAGNOSIS — R062 Wheezing: Secondary | ICD-10-CM

## 2022-09-20 DIAGNOSIS — D72819 Decreased white blood cell count, unspecified: Secondary | ICD-10-CM | POA: Diagnosis not present

## 2022-09-20 DIAGNOSIS — Z0000 Encounter for general adult medical examination without abnormal findings: Secondary | ICD-10-CM

## 2022-09-20 DIAGNOSIS — R110 Nausea: Secondary | ICD-10-CM | POA: Diagnosis not present

## 2022-09-20 DIAGNOSIS — R739 Hyperglycemia, unspecified: Secondary | ICD-10-CM

## 2022-09-20 DIAGNOSIS — Z86010 Personal history of colonic polyps: Secondary | ICD-10-CM | POA: Diagnosis not present

## 2022-09-20 DIAGNOSIS — K219 Gastro-esophageal reflux disease without esophagitis: Secondary | ICD-10-CM | POA: Diagnosis not present

## 2022-09-20 DIAGNOSIS — E7800 Pure hypercholesterolemia, unspecified: Secondary | ICD-10-CM

## 2022-09-20 LAB — CBC WITH DIFFERENTIAL/PLATELET
Basophils Absolute: 0 10*3/uL (ref 0.0–0.1)
Basophils Relative: 1.4 % (ref 0.0–3.0)
Eosinophils Absolute: 0.1 10*3/uL (ref 0.0–0.7)
Eosinophils Relative: 2.1 % (ref 0.0–5.0)
HCT: 41.1 % (ref 36.0–46.0)
Hemoglobin: 14 g/dL (ref 12.0–15.0)
Lymphocytes Relative: 18 % (ref 12.0–46.0)
Lymphs Abs: 0.6 10*3/uL — ABNORMAL LOW (ref 0.7–4.0)
MCHC: 34.1 g/dL (ref 30.0–36.0)
MCV: 92.9 fl (ref 78.0–100.0)
Monocytes Absolute: 0.2 10*3/uL (ref 0.1–1.0)
Monocytes Relative: 7.1 % (ref 3.0–12.0)
Neutro Abs: 2.4 10*3/uL (ref 1.4–7.7)
Neutrophils Relative %: 71.4 % (ref 43.0–77.0)
Platelets: 202 10*3/uL (ref 150.0–400.0)
RBC: 4.43 Mil/uL (ref 3.87–5.11)
RDW: 13.1 % (ref 11.5–15.5)
WBC: 3.3 10*3/uL — ABNORMAL LOW (ref 4.0–10.5)

## 2022-09-20 LAB — VITAMIN B12: Vitamin B-12: 659 pg/mL (ref 211–911)

## 2022-09-20 NOTE — Assessment & Plan Note (Signed)
The 10-year ASCVD risk score (Arnett DK, et al., 2019) is: 11.3%   Values used to calculate the score:     Age: 72 years     Sex: Female     Is Non-Hispanic African American: No     Diabetic: No     Tobacco smoker: No     Systolic Blood Pressure: XX123456 mmHg     Is BP treated: No     HDL Cholesterol: 47.7 mg/dL     Total Cholesterol: 191 mg/dL  Discussed labs.  Discussed calculated cholesterol risk and recommendation to start cholesterol medication.  Low cholesterol diet and exercise.  Follow lipid panel.

## 2022-09-20 NOTE — Progress Notes (Signed)
Subjective:    Patient ID: Lori Guerrero, female    DOB: Oct 02, 1950, 72 y.o.   MRN: [id number]  Patient here for  Chief Complaint  Patient presents with   Annual Exam    HPI Here for physical exam. Treated for sinus infection 08/31/22. Sinus symptoms have improved.  Does report persistent intermittent wheezing.  Has noticed some change with breathing - with exertion.  No chest pain.  No acid reflux.  No increased allergy symptoms.     Discussed persistent wheezing.  Request referral to pulmonary for further evaluation.  Eating.  Persistent intermittent right side pain - RUQ pain.  No specific trigger.  Some nausea.  No bowel change reported.                                                                              Need copy of colonoscopy 10/2019.    Past Medical History:  Diagnosis Date   GERD (gastroesophageal reflux disease)    Hyperglycemia    Hyperplastic colon polyp    Skin cancer    Vitamin D deficiency    Past Surgical History:  Procedure Laterality Date   BREAST BIOPSY     25 years ago   MOHS SURGERY     skin cancer   Family History  Problem Relation Age of Onset   Heart disease Mother    Cancer Paternal Grandfather        colon   Cancer Paternal Aunt        Breast cancer   Breast cancer Paternal Aunt 10   Breast cancer Maternal Aunt    Diabetes Maternal Grandmother    Social History   Socioeconomic History   Marital status: Widowed    Spouse name: Not on file   Number of children: 1   Years of education: Not on file   Highest education level: Not on file  Occupational History   Not on file  Tobacco Use   Smoking status: Never   Smokeless tobacco: Never  Vaping Use   Vaping Use: Never used  Substance and Sexual Activity   Alcohol use: Yes    Alcohol/week: 0.0 standard drinks of alcohol    Comment: occassionally   Drug use: No   Sexual activity: Not Currently    Comment: 1st intercourse- 19, partners- 2, widow   Other Topics Concern   Not on  file  Social History Narrative   Not on file   Social Determinants of Health   Financial Resource Strain: Not on file  Food Insecurity: Not on file  Transportation Needs: Not on file  Physical Activity: Not on file  Stress: Not on file  Social Connections: Not on file    Review of Systems  Constitutional:  Negative for appetite change and unexpected weight change.  HENT:  Negative for congestion, sinus pressure and sore throat.   Eyes:  Negative for pain and visual disturbance.  Respiratory:  Negative for cough, chest tightness and shortness of breath.   Cardiovascular:  Negative for chest pain and palpitations.  Gastrointestinal:  Negative for abdominal pain, diarrhea, nausea and vomiting.  Genitourinary:  Negative for difficulty urinating and dysuria.  Musculoskeletal:  Negative for joint  swelling and myalgias.  Skin:  Negative for color change and rash.  Neurological:  Negative for dizziness and headaches.  Hematological:  Negative for adenopathy. Does not bruise/bleed easily.  Psychiatric/Behavioral:  Negative for agitation and dysphoric mood.        Objective:     BP 112/70   Pulse 71   Temp 98 F (36.7 C)   Resp 16   Ht 5\' 4"  (1.626 m)   Wt 180 lb (81.6 kg)   SpO2 98%   BMI 30.90 kg/m  Wt Readings from Last 3 Encounters:  09/20/22 180 lb (81.6 kg)  08/31/22 180 lb 3.2 oz (81.7 kg)  04/13/22 180 lb 3.2 oz (81.7 kg)    Physical Exam Vitals reviewed.  Constitutional:      General: She is not in acute distress.    Appearance: Normal appearance. She is well-developed.  HENT:     Head: Normocephalic and atraumatic.     Right Ear: External ear normal.     Left Ear: External ear normal.  Eyes:     General: No scleral icterus.       Right eye: No discharge.        Left eye: No discharge.     Conjunctiva/sclera: Conjunctivae normal.  Neck:     Thyroid: No thyromegaly.  Cardiovascular:     Rate and Rhythm: Normal rate and regular rhythm.  Pulmonary:      Effort: No tachypnea, accessory muscle usage or respiratory distress.     Breath sounds: Normal breath sounds. No decreased breath sounds or wheezing.  Chest:  Breasts:    Right: No inverted nipple, mass, nipple discharge or tenderness (no axillary adenopathy).     Left: No inverted nipple, mass, nipple discharge or tenderness (no axilarry adenopathy).  Abdominal:     General: Bowel sounds are normal.     Palpations: Abdomen is soft.     Tenderness: There is no abdominal tenderness.  Musculoskeletal:        General: No swelling or tenderness.     Cervical back: Neck supple.  Lymphadenopathy:     Cervical: No cervical adenopathy.  Skin:    Findings: No erythema or rash.  Neurological:     Mental Status: She is alert and oriented to person, place, and time.  Psychiatric:        Mood and Affect: Mood normal.        Behavior: Behavior normal.      Outpatient Encounter Medications as of 09/20/2022  Medication Sig   esomeprazole (NEXIUM) 20 MG capsule Take 20 mg by mouth daily at 12 noon.   famotidine (PEPCID) 20 MG tablet Take 1 tablet (20 mg total) by mouth daily before supper.   Multiple Vitamins-Minerals (MULTIVITAMIN PO) Take by mouth daily.   [DISCONTINUED] predniSONE (DELTASONE) 20 MG tablet Take 2 tablets with breakfast  for 5 days.   No facility-administered encounter medications on file as of 09/20/2022.     Lab Results  Component Value Date   WBC 3.3 (L) 09/20/2022   HGB 14.0 09/20/2022   HCT 41.1 09/20/2022   PLT 202.0 09/20/2022   GLUCOSE 124 (H) 09/15/2022   CHOL 191 09/15/2022   TRIG 114.0 09/15/2022   HDL 47.70 09/15/2022   LDLCALC 120 (H) 09/15/2022   ALT 20 09/15/2022   AST 20 09/15/2022   NA 140 09/15/2022   K 3.9 09/15/2022   CL 108 09/15/2022   CREATININE 0.89 09/15/2022   BUN 18 09/15/2022   CO2  24 09/15/2022   TSH 2.11 07/27/2021   HGBA1C 6.1 09/15/2022   MICROALBUR <0.7 02/07/2018    MM 3D SCREEN BREAST BILATERAL  Result Date:  07/13/2022 CLINICAL DATA:  Screening. EXAM: DIGITAL SCREENING BILATERAL MAMMOGRAM WITH TOMOSYNTHESIS AND CAD TECHNIQUE: Bilateral screening digital craniocaudal and mediolateral oblique mammograms were obtained. Bilateral screening digital breast tomosynthesis was performed. The images were evaluated with computer-aided detection. COMPARISON:  Previous exam(s). ACR Breast Density Category b: There are scattered areas of fibroglandular density. FINDINGS: There are no findings suspicious for malignancy. IMPRESSION: No mammographic evidence of malignancy. A result letter of this screening mammogram will be mailed directly to the patient. RECOMMENDATION: Screening mammogram in one year. (Code:SM-B-01Y) BI-RADS CATEGORY  1: Negative. Electronically Signed   By: Franki Cabot M.D.   On: 07/13/2022 14:05       Assessment & Plan:  Routine general medical examination at a health care facility  Health care maintenance Assessment & Plan: Physical today 09/20/22.  Mammogram 07/13/22 - Birads I.  Colonoscopy - 07/2016 - recommended f/u in 3 years.  Per note, had f/u colonoscopy 10/2019.    Hypercholesterolemia Assessment & Plan: The 10-year ASCVD risk score (Arnett DK, et al., 2019) is: 11.3%   Values used to calculate the score:     Age: 66 years     Sex: Female     Is Non-Hispanic African American: No     Diabetic: No     Tobacco smoker: No     Systolic Blood Pressure: [id number] mmHg     Is BP treated: No     HDL Cholesterol: 47.7 mg/dL     Total Cholesterol: 191 mg/dL  Discussed labs.  Discussed calculated cholesterol risk and recommendation to start cholesterol medication.  Low cholesterol diet and exercise.  Follow lipid panel.   Orders: -     CBC with Differential/Platelet; Future -     Lipid panel; Future -     Hepatic function panel; Future -     Basic metabolic panel; Future -     TSH; Future  Leukopenia, unspecified type -     CBC with Differential/Platelet -     Vitamin  B12  Hyperglycemia Assessment & Plan: Low carb diet and exercise.  Follow met b and a1c.   Orders: -     Hemoglobin A1c; Future  Wheezing Assessment & Plan: Cough and congestion better.  Taking antihistamine.  No acid reflux.  Persistent wheezing.  Discussed further evaluation, including - spirometry, PFTs, etc.  Refer to pulmonary for further evaluation.   Orders: -     Ambulatory referral to Pulmonology  Nausea Assessment & Plan: Persistent intermittent nausea with right side pain - right upper abdominal pain.  Check labs.  Obtain abdominal ultrasound.  Continue PPI.  No acid reflux.    Orders: -     US Abdomen Complete; Future  Gastroesophageal reflux disease, unspecified whether esophagitis present Assessment & Plan: No acid reflux.  Continue PPI.     History of colonic polyps Assessment & Plan: Had f/u colonoscopy 10/2019.    Stress Assessment & Plan: Overall appears to be handling things relatively well.  Follow.       Einar Pheasant, MD

## 2022-09-20 NOTE — Telephone Encounter (Signed)
Call drop left pt appt time date and location. Npo eat and drink after midnight. thanks

## 2022-09-20 NOTE — Assessment & Plan Note (Signed)
Physical today 09/20/22.  Mammogram 07/13/22 - Birads I.  Colonoscopy - 07/2016 - recommended f/u in 3 years.  Per note, had f/u colonoscopy 10/2019.

## 2022-09-21 ENCOUNTER — Other Ambulatory Visit: Payer: Self-pay

## 2022-09-21 ENCOUNTER — Encounter: Payer: Self-pay | Admitting: Internal Medicine

## 2022-09-21 DIAGNOSIS — D72819 Decreased white blood cell count, unspecified: Secondary | ICD-10-CM

## 2022-09-21 NOTE — Telephone Encounter (Signed)
Pt aware. CBC ordered and scheduled. Will hold on hematology referral for now.

## 2022-09-21 NOTE — Telephone Encounter (Signed)
My plan was to repeat it one more time and see if white blood cell count and lymphocyte count both remained low.  If so, then would refer to hematology for further evaluation.  If she is concerned and wants further w/up now, then I am ok with referring to hematology for further evaluation.

## 2022-09-22 NOTE — Telephone Encounter (Signed)
If she is not taking new pts, we can get her in with one of the other pulmonologist in [place].

## 2022-10-01 ENCOUNTER — Encounter: Payer: Self-pay | Admitting: Internal Medicine

## 2022-10-01 NOTE — Assessment & Plan Note (Signed)
Persistent intermittent nausea with right side pain - right upper abdominal pain.  Check labs.  Obtain abdominal ultrasound.  Continue PPI.  No acid reflux.

## 2022-10-01 NOTE — Assessment & Plan Note (Signed)
Overall appears to be handling things relatively well.  Follow.   

## 2022-10-01 NOTE — Assessment & Plan Note (Signed)
Low carb diet and exercise.  Follow met b and a1c.   

## 2022-10-01 NOTE — Assessment & Plan Note (Signed)
Cough and congestion better.  Taking antihistamine.  No acid reflux.  Persistent wheezing.  Discussed further evaluation, including - spirometry, PFTs, etc.  Refer to pulmonary for further evaluation.

## 2022-10-01 NOTE — Assessment & Plan Note (Signed)
No acid reflux.  Continue PPI.

## 2022-10-01 NOTE — Assessment & Plan Note (Signed)
Had f/u colonoscopy 10/2019.  °

## 2022-10-02 ENCOUNTER — Telehealth: Payer: Self-pay | Admitting: Internal Medicine

## 2022-10-02 DIAGNOSIS — Z03818 Encounter for observation for suspected exposure to other biological agents ruled out: Secondary | ICD-10-CM | POA: Diagnosis not present

## 2022-10-02 DIAGNOSIS — J40 Bronchitis, not specified as acute or chronic: Secondary | ICD-10-CM | POA: Diagnosis not present

## 2022-10-02 NOTE — Telephone Encounter (Signed)
Advised that we cannot prescribe abx without a visit. Pt has been scheduled for acute appt with Ollen Gross tomorrow AM

## 2022-10-02 NOTE — Telephone Encounter (Signed)
Pt called in staying that she don't feel good, and was wondering if provider can send some antibiotics over @ [facility]. As per pt, last antibiotics worked perfectly. The symptoms that pt its having at the moment are congested, low fever, headache, sinus, allergy. As per pt, she does not know why she's always getting sick.

## 2022-10-03 ENCOUNTER — Encounter: Payer: Self-pay | Admitting: Nurse Practitioner

## 2022-10-03 ENCOUNTER — Ambulatory Visit (INDEPENDENT_AMBULATORY_CARE_PROVIDER_SITE_OTHER): Payer: Medicare PPO | Admitting: Nurse Practitioner

## 2022-10-03 ENCOUNTER — Ambulatory Visit: Payer: Medicare PPO

## 2022-10-03 VITALS — BP 119/81 | HR 73 | Temp 99.1°F | Ht 64.0 in | Wt 182.2 lb

## 2022-10-03 DIAGNOSIS — J40 Bronchitis, not specified as acute or chronic: Secondary | ICD-10-CM | POA: Diagnosis not present

## 2022-10-03 NOTE — Progress Notes (Signed)
Tomasita Morrow, NP-C Phone: [phone]  Lori Guerrero is a 72 y.o. female who presents today for cough and congestion.  Symptoms began approximately 1 week ago. She did go to a walk-in clinic yesterday where she was diagnosed with bronchitis. COVID, Flu and RSV testing was all negative. She was given Levaquin, Prednisone, cough syrup and an albuterol inhaler. Her symptoms have improved some today. She had similar symptoms in February. She has referrals in to Pulmonology and ENT.   Respiratory illness:  Cough- Yes  Congestion-    Sinus- No   Chest- Yes  Post nasal drip- Yes  Sore throat- No  Shortness of breath- On exertion, wheezing yesterday  Fever- Yes  Fatigue/Myalgia- Yes Headache- Yes Nausea/Vomiting- No Taste disturbance- No  Smell disturbance- No  Covid exposure- No  Covid vaccination- x 2  Flu vaccination- Declined  Medications- Delsym and Tylenol   Social History   Tobacco Use  Smoking Status Never  Smokeless Tobacco Never    Current Outpatient Medications on File Prior to Visit  Medication Sig Dispense Refill   esomeprazole (NEXIUM) 20 MG capsule Take 20 mg by mouth daily at 12 noon.     famotidine (PEPCID) 20 MG tablet Take 1 tablet (20 mg total) by mouth daily before supper. 90 tablet 3   Multiple Vitamins-Minerals (MULTIVITAMIN PO) Take by mouth daily.     No current facility-administered medications on file prior to visit.     ROS see history of present illness  Objective  Physical Exam Vitals:   10/03/22 0834  BP: 119/81  Pulse: 73  Temp: 99.1 F (37.3 C)  SpO2: 95%    BP Readings from Last 3 Encounters:  10/03/22 119/81  09/20/22 112/70  08/31/22 (!) 140/78   Wt Readings from Last 3 Encounters:  10/03/22 182 lb 3.2 oz (82.6 kg)  09/20/22 180 lb (81.6 kg)  08/31/22 180 lb 3.2 oz (81.7 kg)    Physical Exam Constitutional:      General: She is not in acute distress.    Appearance: Normal appearance.  HENT:     Head:  Normocephalic.     Right Ear: Tympanic membrane normal.     Left Ear: Tympanic membrane normal.     Nose: Nose normal.     Mouth/Throat:     Mouth: Mucous membranes are moist.     Pharynx: Oropharynx is clear.  Eyes:     Conjunctiva/sclera: Conjunctivae normal.     Pupils: Pupils are equal, round, and reactive to light.  Cardiovascular:     Rate and Rhythm: Normal rate and regular rhythm.     Heart sounds: Normal heart sounds.  Pulmonary:     Effort: Pulmonary effort is normal.     Breath sounds: Normal breath sounds. No wheezing or rhonchi.  Abdominal:     General: Abdomen is flat. Bowel sounds are normal.     Palpations: Abdomen is soft. There is no mass.     Tenderness: There is no abdominal tenderness.  Lymphadenopathy:     Cervical: No cervical adenopathy.  Skin:    General: Skin is warm and dry.  Neurological:     General: No focal deficit present.     Mental Status: She is alert.  Psychiatric:        Mood and Affect: Mood normal.        Behavior: Behavior normal.    Assessment/Plan: Please see individual problem list.  Bronchitis Assessment & Plan: Advised patient to complete her Levaquin and  Prednisone as prescribed. Albuterol inhaler and cough syrup as needed. She can continue Tylenol as needed for fevers. Education provided regarding inhaler use. Encouraged adequate hydration. She will contact if her symptoms do not continue to improve or are worsening. Follow up with Pulmonology and ENT. Return precautions given to patient.     Return if symptoms worsen or fail to improve.   Tomasita Morrow, NP-C [place]

## 2022-10-03 NOTE — Assessment & Plan Note (Addendum)
Advised patient to complete her Levaquin and Prednisone as prescribed. Albuterol inhaler and cough syrup as needed. She can continue Tylenol as needed for fevers. Education provided regarding inhaler use. Encouraged adequate hydration. She will contact if her symptoms do not continue to improve or are worsening. Follow up with Pulmonology and ENT. Return precautions given to patient.

## 2022-10-10 ENCOUNTER — Ambulatory Visit: Admission: RE | Admit: 2022-10-10 | Payer: Medicare PPO | Source: Ambulatory Visit

## 2022-10-17 DIAGNOSIS — G8929 Other chronic pain: Secondary | ICD-10-CM | POA: Diagnosis not present

## 2022-10-17 DIAGNOSIS — M1712 Unilateral primary osteoarthritis, left knee: Secondary | ICD-10-CM | POA: Diagnosis not present

## 2022-10-18 ENCOUNTER — Ambulatory Visit
Admission: RE | Admit: 2022-10-18 | Discharge: 2022-10-18 | Disposition: A | Discharge status: Home / Self Care | Payer: Medicare PPO | Source: Ambulatory Visit | Attending: Internal Medicine | Admitting: Internal Medicine

## 2022-10-18 ENCOUNTER — Other Ambulatory Visit (INDEPENDENT_AMBULATORY_CARE_PROVIDER_SITE_OTHER): Payer: Medicare PPO

## 2022-10-18 DIAGNOSIS — R110 Nausea: Secondary | ICD-10-CM | POA: Diagnosis not present

## 2022-10-18 DIAGNOSIS — D72819 Decreased white blood cell count, unspecified: Secondary | ICD-10-CM

## 2022-10-18 DIAGNOSIS — R1011 Right upper quadrant pain: Secondary | ICD-10-CM | POA: Diagnosis not present

## 2022-10-18 LAB — CBC WITH DIFFERENTIAL/PLATELET
Basophils Absolute: 0 10*3/uL (ref 0.0–0.1)
Basophils Relative: 0.1 % (ref 0.0–3.0)
Eosinophils Absolute: 0 10*3/uL (ref 0.0–0.7)
Eosinophils Relative: 0.2 % (ref 0.0–5.0)
HCT: 41.8 % (ref 36.0–46.0)
Hemoglobin: 14.3 g/dL (ref 12.0–15.0)
Lymphocytes Relative: 7.6 % — ABNORMAL LOW (ref 12.0–46.0)
Lymphs Abs: 1 10*3/uL (ref 0.7–4.0)
MCHC: 34.1 g/dL (ref 30.0–36.0)
MCV: 92.7 fl (ref 78.0–100.0)
Monocytes Absolute: 0.7 10*3/uL (ref 0.1–1.0)
Monocytes Relative: 5.2 % (ref 3.0–12.0)
Neutro Abs: 11.2 10*3/uL — ABNORMAL HIGH (ref 1.4–7.7)
Neutrophils Relative %: 86.9 % — ABNORMAL HIGH (ref 43.0–77.0)
Platelets: 243 10*3/uL (ref 150.0–400.0)
RBC: 4.51 Mil/uL (ref 3.87–5.11)
RDW: 13 % (ref 11.5–15.5)
WBC: 12.9 10*3/uL — ABNORMAL HIGH (ref 4.0–10.5)

## 2022-10-19 ENCOUNTER — Other Ambulatory Visit: Payer: Self-pay | Admitting: Otolaryngology

## 2022-10-19 DIAGNOSIS — J329 Chronic sinusitis, unspecified: Secondary | ICD-10-CM | POA: Diagnosis not present

## 2022-10-19 DIAGNOSIS — R052 Subacute cough: Secondary | ICD-10-CM | POA: Diagnosis not present

## 2022-10-19 DIAGNOSIS — F458 Other somatoform disorders: Secondary | ICD-10-CM

## 2022-10-19 DIAGNOSIS — E041 Nontoxic single thyroid nodule: Secondary | ICD-10-CM | POA: Diagnosis not present

## 2022-10-19 DIAGNOSIS — J340 Abscess, furuncle and carbuncle of nose: Secondary | ICD-10-CM | POA: Diagnosis not present

## 2022-10-19 DIAGNOSIS — J301 Allergic rhinitis due to pollen: Secondary | ICD-10-CM | POA: Diagnosis not present

## 2022-10-31 ENCOUNTER — Ambulatory Visit
Admission: RE | Admit: 2022-10-31 | Discharge: 2022-10-31 | Disposition: A | Discharge status: Home / Self Care | Payer: Medicare PPO | Source: Ambulatory Visit | Attending: Otolaryngology | Admitting: Otolaryngology

## 2022-10-31 DIAGNOSIS — J301 Allergic rhinitis due to pollen: Secondary | ICD-10-CM | POA: Diagnosis not present

## 2022-10-31 DIAGNOSIS — E041 Nontoxic single thyroid nodule: Secondary | ICD-10-CM

## 2022-10-31 DIAGNOSIS — F458 Other somatoform disorders: Secondary | ICD-10-CM

## 2022-10-31 DIAGNOSIS — R911 Solitary pulmonary nodule: Secondary | ICD-10-CM | POA: Diagnosis not present

## 2022-11-13 DIAGNOSIS — D72819 Decreased white blood cell count, unspecified: Secondary | ICD-10-CM | POA: Diagnosis not present

## 2022-11-15 ENCOUNTER — Ambulatory Visit: Payer: Medicare PPO | Admitting: Pulmonary Disease

## 2022-11-15 ENCOUNTER — Encounter: Payer: Self-pay | Admitting: Pulmonary Disease

## 2022-11-15 VITALS — BP 132/80 | HR 66 | Temp 98.0°F | Ht 64.0 in | Wt 183.0 lb

## 2022-11-15 DIAGNOSIS — J45998 Other asthma: Secondary | ICD-10-CM | POA: Diagnosis not present

## 2022-11-15 DIAGNOSIS — Z7189 Other specified counseling: Secondary | ICD-10-CM | POA: Diagnosis not present

## 2022-11-15 DIAGNOSIS — L820 Inflamed seborrheic keratosis: Secondary | ICD-10-CM | POA: Diagnosis not present

## 2022-11-15 DIAGNOSIS — D485 Neoplasm of uncertain behavior of skin: Secondary | ICD-10-CM | POA: Diagnosis not present

## 2022-11-15 DIAGNOSIS — Z85828 Personal history of other malignant neoplasm of skin: Secondary | ICD-10-CM | POA: Diagnosis not present

## 2022-11-15 DIAGNOSIS — Z86006 Personal history of melanoma in-situ: Secondary | ICD-10-CM | POA: Diagnosis not present

## 2022-11-15 DIAGNOSIS — Z08 Encounter for follow-up examination after completed treatment for malignant neoplasm: Secondary | ICD-10-CM | POA: Diagnosis not present

## 2022-11-15 DIAGNOSIS — K219 Gastro-esophageal reflux disease without esophagitis: Secondary | ICD-10-CM | POA: Diagnosis not present

## 2022-11-15 DIAGNOSIS — R062 Wheezing: Secondary | ICD-10-CM

## 2022-11-15 DIAGNOSIS — R208 Other disturbances of skin sensation: Secondary | ICD-10-CM | POA: Diagnosis not present

## 2022-11-15 DIAGNOSIS — L814 Other melanin hyperpigmentation: Secondary | ICD-10-CM | POA: Diagnosis not present

## 2022-11-15 DIAGNOSIS — Z85820 Personal history of malignant melanoma of skin: Secondary | ICD-10-CM | POA: Diagnosis not present

## 2022-11-15 LAB — NITRIC OXIDE: Nitric Oxide: 21

## 2022-11-15 NOTE — Progress Notes (Signed)
Subjective:    Patient ID: Lori Guerrero, female    DOB: Jul 28, 1950, 72 y.o.   MRN: [id number] Patient Care Team: Dale Delta, MD as PCP - General (Internal Medicine)  Chief Complaint  Patient presents with   Consult    Wheezing for a few months. SOB with exertion. No cough.    HPI Kember is a 72 year old lifelong never smoker with positive exposure to tobacco and a history as noted below who presents for evaluation of wheezing and shortness of breath with exertion.  The patient is kindly referred by Dr. Dale Esparto.  Patient states that over the last 6 months she has had recurrent issues with respiratory infections.  First episode occurred around October or November last year subsequently had other episodes in February and April this year.  She notes that these episodes are associated with wheezing can occur randomly, dry nonproductive cough and significant nasal congestion with postnasal drip.  She notes that when she gets these episodes prednisone helps.  Though her cough is dry occasionally she will get an episode of what she calls a violent cough and then may produce some tenacious clear sputum.  In April she developed same issues of wheezing, shortness of breath and significant nasal congestion and was seen by Dr. Andee Poles who recommended using a Netti pot and since then she has noted improvement on some of her symptoms mainly those of nasal congestion.  Not describe any asthma or chronic illnesses as a child.  She has not had any recent fevers, chills or sweats.  No orthopnea or paroxysmal nocturnal dyspnea.  No lower extremity edema or calf tenderness.  He was given a prescription for albuterol Jearld Pies has not had this filled.  She had COVID-19 in 2022 with no major sequela.  She is retired with no significant occupational history.  She is an avid gardener and does note that she was spraying Roundup a week or 2 ago and accidentally inhaled some of the product and developed some cough  afterwards.  This subsequently resolved.  She has no exotic pets.  She does have a dog, a poodle.  No hot tubs in the home no exotic hobbies.  She has had allergy testing previously that has shown sensitivities to mold and dust mites.  Prior pulmonary function testing.  She does not endorse any other symptomatology.   Review of Systems A 10 point review of systems was performed and it is as noted above otherwise negative.  Past Medical History:  Diagnosis Date   GERD (gastroesophageal reflux disease)    Hyperglycemia    Hyperplastic colon polyp    Skin cancer    Vitamin D deficiency    Past Surgical History:  Procedure Laterality Date   BREAST BIOPSY     25 years ago   MOHS SURGERY     skin cancer   Patient Active Problem List   Diagnosis Date Noted   Bronchitis 10/03/2022   Nausea 09/20/2022   Other acute sinusitis 09/05/2022   Hypercholesterolemia 04/13/2022   Wheezing 04/13/2022   Hip pain, right 11/21/2021   Nasal lesion 08/29/2021   Tail bone pain 08/07/2021   Foot pain, right 05/02/2021   Abnormal CXR 01/18/2021   Cough 05/18/2020   Blue toes 12/06/2019   Toe pain 11/16/2019   SOB (shortness of breath) on exertion 10/18/2019   Dizziness 10/18/2019   Back pain 06/30/2019   Breast asymmetry 05/09/2019   Right arm pain 04/23/2019   Skin lesions 04/23/2019  Right upper quadrant abdominal pain 05/22/2018   Nasal congestion 08/04/2017   Cerumen impaction 08/04/2017   Stress 04/05/2015   Health care maintenance 04/05/2015   Lower abdominal pain 04/14/2013   Hyperglycemia 08/13/2012   GERD (gastroesophageal reflux disease) 08/13/2012   History of colonic polyps 08/13/2012   Vitamin D deficiency 08/13/2012   Skin cancer 08/13/2012   Family History  Problem Relation Age of Onset   Heart disease Mother    Cancer Paternal Grandfather        colon   Cancer Paternal Aunt        Breast cancer   Breast cancer Paternal Aunt 5   Breast cancer Maternal Aunt     Diabetes Maternal Grandmother    Social History   Tobacco Use   Smoking status: Never    Passive exposure: Past (Husband did smoke.)   Smokeless tobacco: Never  Substance Use Topics   Alcohol use: Yes    Alcohol/week: 0.0 standard drinks of alcohol    Comment: occassionally   No Known Allergies  Current Meds  Medication Sig   albuterol (VENTOLIN HFA) 108 (90 Base) MCG/ACT inhaler Inhale 2 puffs into the lungs every 6 (six) hours as needed.   esomeprazole (NEXIUM) 40 MG capsule Take 40 mg by mouth daily at 12 noon.   famotidine (PEPCID) 20 MG tablet Take 1 tablet (20 mg total) by mouth daily before supper.   Multiple Vitamins-Minerals (MULTIVITAMIN PO) Take by mouth daily.   Immunization History  Administered Date(s) Administered   Influenza-Unspecified 07/15/2009   PFIZER(Purple Top)SARS-COV-2 Vaccination 08/23/2019, 09/16/2019   Tdap 06/23/2014       Objective:   Physical Exam BP 132/80 (BP Location: Left Arm, Cuff Size: Normal)   Pulse 66   Temp 98 F (36.7 C)   Ht 5\' 4"  (1.626 m)   Wt 183 lb (83 kg)   SpO2 97%   BMI 31.41 kg/m    SpO2: 97 % O2 Device: None (Room air)  GENERAL: Well-developed somewhat overweight woman, no acute distress.  Fully ambulatory, no conversational dyspnea. HEAD: Normocephalic, atraumatic.  EYES: Pupils equal, round, reactive to light.  No scleral icterus.  MOUTH: Dentition intact, oral mucosa moist. NECK: Supple. No thyromegaly. Trachea midline. No JVD.  No adenopathy. PULMONARY: Good air entry bilaterally.  No adventitious sounds. CARDIOVASCULAR: S1 and S2. Regular rate and rhythm.  No rubs, murmurs or gallops heard. ABDOMEN: Benign. MUSCULOSKELETAL: No joint deformity, no clubbing, no edema.  NEUROLOGIC: No overt focal deficit, no gait disturbance, speech is fluent. SKIN: Intact,warm,dry. PSYCH: Mood and behavior normal  Lab Results  Component Value Date   NITRICOXIDE 21 11/15/2022   Chest x-ray performed 11 May 2022  showing no acute pulmonary abnormality:     Assessment & Plan:     ICD-10-CM   1. Wheezing  R06.2 Nitric oxide    Pulmonary Function Test [facility] Only   Suspect that this is related to asthma Will obtain PFTs May use albuterol as needed    2. Persistent asthma with undetermined severity  J45.998 Pulmonary Function Test [facility] Only   May use albuterol as needed Patient actually has active prescription in the pharmacy Advised to fill    3. Gastroesophageal reflux disease, unspecified whether esophagitis present  K21.9    This issue adds complexity to her management Reflux may be asthma trigger On Nexium and Pepcid     Orders Placed This Encounter  Procedures   Nitric oxide   Pulmonary Function Test [facility] Only  Standing Status:   Future    Standing Expiration Date:   11/15/2023    Order Specific Question:   Full PFT: includes the following: basic spirometry, spirometry pre & post bronchodilator, diffusion capacity (DLCO), lung volumes    Answer:   Full PFT    Order Specific Question:   This test can only be performed at    Answer:   [facility]   I have discussed my impressions with the patient.  Initially was to prescribe albuterol however noticed that the patient actually has an active prescription in the pharmacy she was a wise to fill this medication and use it as needed for now.  We are obtaining pulmonary function testing and will determine at a later time if any additional inhalers need to be prescribed.  Will see the patient in follow-up in 4 to 6 weeks time she is to contact us prior to that time should any new difficulties arise.  Gailen Shelter, MD [facility]    *This note was dictated using voice recognition software/Dragon.  Despite best efforts to proofread, errors can occur which can change the meaning. Any transcriptional errors that result from this process are unintentional and may not be fully corrected at  the time of dictation.

## 2022-11-15 NOTE — Patient Instructions (Addendum)
I suspect you have asthma.  We are going to get some breathing test.  I have sent in a prescription for an emergency inhaler that you can use when you notice bouts of cough or wheezing.  You can use this up to 4 times a day only if needed.  We will see you in follow-up in 4 to 6 weeks time call sooner should any new problems arise.

## 2022-11-17 ENCOUNTER — Other Ambulatory Visit: Payer: Self-pay | Admitting: Otolaryngology

## 2022-11-17 DIAGNOSIS — R430 Anosmia: Secondary | ICD-10-CM | POA: Diagnosis not present

## 2022-11-17 DIAGNOSIS — E041 Nontoxic single thyroid nodule: Secondary | ICD-10-CM | POA: Diagnosis not present

## 2022-11-17 DIAGNOSIS — J301 Allergic rhinitis due to pollen: Secondary | ICD-10-CM | POA: Diagnosis not present

## 2022-11-18 ENCOUNTER — Encounter: Payer: Self-pay | Admitting: Pulmonary Disease

## 2022-11-21 ENCOUNTER — Ambulatory Visit: Payer: Medicare PPO | Attending: Pulmonary Disease

## 2022-11-21 DIAGNOSIS — J45998 Other asthma: Secondary | ICD-10-CM | POA: Diagnosis not present

## 2022-11-21 DIAGNOSIS — J45909 Unspecified asthma, uncomplicated: Secondary | ICD-10-CM | POA: Insufficient documentation

## 2022-11-21 DIAGNOSIS — R062 Wheezing: Secondary | ICD-10-CM | POA: Diagnosis present

## 2022-11-21 LAB — PULMONARY FUNCTION TEST ARMC ONLY
DL/VA % pred: 97 %
DL/VA: 4.02 ml/min/mmHg/L
DLCO unc % pred: 94 %
DLCO unc: 18.47 ml/min/mmHg
FEF 25-75 Post: 2.27 L/sec
FEF 25-75 Pre: 2.55 L/sec
FEF2575-%Change-Post: -10 %
FEF2575-%Pred-Post: 119 %
FEF2575-%Pred-Pre: 134 %
FEV1-%Change-Post: -2 %
FEV1-%Pred-Post: 96 %
FEV1-%Pred-Pre: 98 %
FEV1-Post: 2.19 L
FEV1-Pre: 2.24 L
FEV1FVC-%Change-Post: 2 %
FEV1FVC-%Pred-Pre: 107 %
FEV6-%Change-Post: -4 %
FEV6-%Pred-Post: 90 %
FEV6-%Pred-Pre: 95 %
FEV6-Post: 2.6 L
FEV6-Pre: 2.73 L
FEV6FVC-%Pred-Post: 104 %
FEV6FVC-%Pred-Pre: 104 %
FVC-%Change-Post: -4 %
FVC-%Pred-Post: 86 %
FVC-%Pred-Pre: 91 %
FVC-Post: 2.6 L
FVC-Pre: 2.74 L
Post FEV1/FVC ratio: 84 %
Post FEV6/FVC ratio: 100 %
Pre FEV1/FVC ratio: 82 %
Pre FEV6/FVC Ratio: 100 %
RV % pred: 33 %
RV: 0.73 L
TLC % pred: 68 %
TLC: 3.48 L

## 2022-11-21 MED ORDER — ALBUTEROL SULFATE (2.5 MG/3ML) 0.083% IN NEBU
2.5000 mg | INHALATION_SOLUTION | Freq: Once | RESPIRATORY_TRACT | Status: AC
  Administered 2022-11-21: 2.5 mg via RESPIRATORY_TRACT
  Filled 2022-11-21: qty 3

## 2022-12-06 ENCOUNTER — Ambulatory Visit
Admission: RE | Admit: 2022-12-06 | Discharge: 2022-12-06 | Disposition: A | Discharge status: Home / Self Care | Payer: Medicare PPO | Source: Ambulatory Visit | Attending: Otolaryngology | Admitting: Otolaryngology

## 2022-12-06 DIAGNOSIS — R430 Anosmia: Secondary | ICD-10-CM

## 2022-12-06 MED ORDER — GADOPICLENOL 0.5 MMOL/ML IV SOLN
8.5000 mL | Freq: Once | INTRAVENOUS | Status: AC | PRN
  Administered 2022-12-06: 8.5 mL via INTRAVENOUS

## 2022-12-15 ENCOUNTER — Ambulatory Visit: Payer: Medicare PPO | Admitting: Adult Health

## 2022-12-19 ENCOUNTER — Other Ambulatory Visit: Payer: Medicare PPO

## 2022-12-21 ENCOUNTER — Ambulatory Visit: Payer: Medicare PPO | Admitting: Internal Medicine

## 2023-01-08 ENCOUNTER — Ambulatory Visit: Payer: Medicare PPO | Admitting: Adult Health

## 2023-01-08 ENCOUNTER — Encounter: Payer: Self-pay | Admitting: Adult Health

## 2023-01-08 VITALS — BP 140/82 | HR 71 | Temp 97.8°F | Ht 64.0 in | Wt 183.6 lb

## 2023-01-08 DIAGNOSIS — J310 Chronic rhinitis: Secondary | ICD-10-CM

## 2023-01-08 DIAGNOSIS — J4520 Mild intermittent asthma, uncomplicated: Secondary | ICD-10-CM

## 2023-01-08 DIAGNOSIS — K219 Gastro-esophageal reflux disease without esophagitis: Secondary | ICD-10-CM | POA: Diagnosis not present

## 2023-01-08 NOTE — Assessment & Plan Note (Signed)
Continue with Claritin and Ryaltris As needed

## 2023-01-08 NOTE — Patient Instructions (Addendum)
Continue on Nexium and Pepcid.  Saline nasal rinses As needed   Ryaltris nasal As needed   Claritin daily As needed   Albuterol inhaler As needed   Follow up with Dr. Jayme Cloud in 1 year and As needed

## 2023-01-08 NOTE — Assessment & Plan Note (Signed)
Cont on current regimen  

## 2023-01-08 NOTE — Progress Notes (Signed)
@Patient  ID: Lori Guerrero, female    DOB: 1950/10/10, 72 y.o.   MRN: [id number]  Chief Complaint  Patient presents with   Follow-up    Referring provider: Dale Mobile, MD  HPI: 72 year old female never smoker seen for pulmonary consult Nov 15, 2022 for wheezing and shortness of breath felt secondary to Asthma   TEST/EVENTS :  Labs October 18, 2022 absolute eosinophil count 0  Chest x-ray November 2023 resolved right mid consolidation   PFTs Nov 21, 2022 normal, FEV1 96%, ratio 84, FVC 86% no significant bronchodilator response, diffusing capacity 94%  01/08/2023 Follow up : Asthma  Patient returns for a 6-week follow-up.  Patient was seen last visit for pulmonary consult for wheezing and shortness of breath along with recurrent respiratory infections since October 2023.  Developed cough, congestion and wheezing. Chest xray 04/2022 showed mid right lung consolidation . Follow up chest xray showed clearance. Cough and wheezing waxed and waned after treatment.  Patient was felt to have underlying asthma last office visit.   She was given albuterol to use as needed.  Prior to visit was on claritin , Ryaltris nasal spray and along with Nexium and Pepcid. She was set up for pulmonary function testing that was done Nov 21, 2022 that showed normal lung function with no airflow obstruction or restriction.  FEV1 was 96%, ratio 84, FVC 86% with no significant bronchodilator response.  Diffusing capacity was normal at 94%. We discussed results in detail.  Since last visit she is feeling better. Cough and wheezing have resolved. No albuterol use. Only taking allergy meds As needed. Activity level is unchanged.     No Known Allergies  Immunization History  Administered Date(s) Administered   Influenza-Unspecified 07/15/2009   PFIZER(Purple Top)SARS-COV-2 Vaccination 08/23/2019, 09/16/2019   Tdap 06/23/2014    Past Medical History:  Diagnosis Date   GERD (gastroesophageal reflux disease)     Hyperglycemia    Hyperplastic colon polyp    Skin cancer    Vitamin D deficiency     Tobacco History: Social History   Tobacco Use  Smoking Status Never   Passive exposure: Past (Husband did smoke.)  Smokeless Tobacco Never   Counseling given: Not Answered   Outpatient Medications Prior to Visit  Medication Sig Dispense Refill   albuterol (VENTOLIN HFA) 108 (90 Base) MCG/ACT inhaler Inhale 2 puffs into the lungs every 6 (six) hours as needed.     esomeprazole (NEXIUM) 40 MG capsule Take 40 mg by mouth daily at 12 noon.     famotidine (PEPCID) 20 MG tablet Take 1 tablet (20 mg total) by mouth daily before supper. 90 tablet 3   Multiple Vitamins-Minerals (MULTIVITAMIN PO) Take by mouth daily.     esomeprazole (NEXIUM) 20 MG capsule Take 20 mg by mouth daily at 12 noon.     No facility-administered medications prior to visit.     Review of Systems:   Constitutional:   No  weight loss, night sweats,  Fevers, chills, fatigue, or  lassitude.  HEENT:   No headaches,  Difficulty swallowing,  Tooth/dental problems, or  Sore throat,                No sneezing, itching, ear ache,  +nasal congestion, post nasal drip,   CV:  No chest pain,  Orthopnea, PND, swelling in lower extremities, anasarca, dizziness, palpitations, syncope.   GI  No heartburn, indigestion, abdominal pain, nausea, vomiting, diarrhea, change in bowel habits, loss of appetite, bloody stools.  Resp: .  No chest wall deformity  Skin: no rash or lesions.  GU: no dysuria, change in color of urine, no urgency or frequency.  No flank pain, no hematuria   MS:  No joint pain or swelling.  No decreased range of motion.  No back pain.    Physical Exam  BP (!) 140/82 (BP Location: Left Arm, Cuff Size: Normal)   Pulse 71   Temp 97.8 F (36.6 C) (Temporal)   Ht 5\' 4"  (1.626 m)   Wt 183 lb 9.6 oz (83.3 kg)   SpO2 97%   BMI 31.51 kg/m   GEN: A/Ox3; pleasant , NAD, well nourished    HEENT:  [place]/AT,    NOSE-clear, THROAT-clear, no lesions, no postnasal drip or exudate noted.   NECK:  Supple w/ fair ROM; no JVD; normal carotid impulses w/o bruits; no thyromegaly or nodules palpated; no lymphadenopathy.    RESP  Clear  P & A; w/o, wheezes/ rales/ or rhonchi. no accessory muscle use, no dullness to percussion  CARD:  RRR, no m/r/g, no peripheral edema, pulses intact, no cyanosis or clubbing.  GI:   Soft & nt; nml bowel sounds; no organomegaly or masses detected.   Musco: Warm bil, no deformities or joint swelling noted.   Neuro: alert, no focal deficits noted.    Skin: Warm, no lesions or rashes      BNP No results found for: "BNP"  ProBNP No results found for: "PROBNP"  Imaging: No results found.  albuterol (PROVENTIL) (2.5 MG/3ML) 0.083% nebulizer solution 2.5 mg     Date Action Dose Route User   11/21/2022 1530 Given 2.5 mg Nebulization Karn Cassis, RT          Latest Ref Rng & Units 11/21/2022    3:43 PM  PFT Results  FVC-Pre L 2.74   FVC-Predicted Pre % 91   FVC-Post L 2.60   FVC-Predicted Post % 86   Pre FEV1/FVC % % 82   Post FEV1/FCV % % 84   FEV1-Pre L 2.24   FEV1-Predicted Pre % 98   FEV1-Post L 2.19   DLCO uncorrected ml/min/mmHg 18.47   DLCO UNC% % 94   DLVA Predicted % 97   TLC L 3.48   TLC % Predicted % 68   RV % Predicted % 33     Lab Results  Component Value Date   NITRICOXIDE 21 11/15/2022        Assessment & Plan:   Asthma Mild intermittent asthma- improved control on regimen aimed at chronic rhinitis and GERD prevention   Plan  Patient Instructions  Continue on Nexium and Pepcid.  Saline nasal rinses As needed   Ryaltris nasal As needed   Claritin daily As needed   Albuterol inhaler As needed   Follow up with Dr. Jayme Cloud in 1 year and As needed        Chronic rhinitis Continue with Claritin and Ryaltris As needed    GERD (gastroesophageal reflux disease) Cont on current regimen      Rubye Oaks,  NP 01/08/2023

## 2023-01-08 NOTE — Progress Notes (Signed)
Agree with the details of the visit as noted by Tammy Parrett, NP.  C. Laura Hyder Deman, MD [facility] 

## 2023-01-08 NOTE — Assessment & Plan Note (Signed)
Mild intermittent asthma- improved control on regimen aimed at chronic rhinitis and GERD prevention   Plan  Patient Instructions  Continue on Nexium and Pepcid.  Saline nasal rinses As needed   Ryaltris nasal As needed   Claritin daily As needed   Albuterol inhaler As needed   Follow up with Dr. Jayme Cloud in 1 year and As needed

## 2023-01-15 ENCOUNTER — Other Ambulatory Visit: Payer: Medicare PPO

## 2023-01-15 DIAGNOSIS — E7800 Pure hypercholesterolemia, unspecified: Secondary | ICD-10-CM | POA: Diagnosis not present

## 2023-01-15 DIAGNOSIS — R739 Hyperglycemia, unspecified: Secondary | ICD-10-CM

## 2023-01-15 LAB — LIPID PANEL
Cholesterol: 174 mg/dL (ref 0–200)
HDL: 48.2 mg/dL (ref >39.00)
LDL Cholesterol: 101 mg/dL — ABNORMAL HIGH (ref 0–99)
NonHDL: 125.64
Total CHOL/HDL Ratio: 4
Triglycerides: 125 mg/dL (ref 0.0–149.0)
VLDL: 25 mg/dL (ref 0.0–40.0)

## 2023-01-15 LAB — BASIC METABOLIC PANEL
BUN: 18 mg/dL (ref 6–23)
CO2: 23 mEq/L (ref 19–32)
Calcium: 9.6 mg/dL (ref 8.4–10.5)
Chloride: 109 mEq/L (ref 96–112)
Creatinine, Ser: 0.83 mg/dL (ref 0.40–1.20)
GFR: 71.19 mL/min (ref >60.00)
Glucose, Bld: 113 mg/dL — ABNORMAL HIGH (ref 70–99)
Potassium: 4.2 mEq/L (ref 3.5–5.1)
Sodium: 141 mEq/L (ref 135–145)

## 2023-01-15 LAB — HEPATIC FUNCTION PANEL
ALT: 21 U/L (ref 0–35)
AST: 21 U/L (ref 0–37)
Albumin: 4.1 g/dL (ref 3.5–5.2)
Alkaline Phosphatase: 104 U/L (ref 39–117)
Bilirubin, Direct: 0.1 mg/dL (ref 0.0–0.3)
Total Bilirubin: 0.4 mg/dL (ref 0.2–1.2)
Total Protein: 6.2 g/dL (ref 6.0–8.3)

## 2023-01-15 LAB — CBC WITH DIFFERENTIAL/PLATELET
Basophils Absolute: 0 10*3/uL (ref 0.0–0.1)
Basophils Relative: 1.1 % (ref 0.0–3.0)
Eosinophils Absolute: 0.1 10*3/uL (ref 0.0–0.7)
Eosinophils Relative: 2.6 % (ref 0.0–5.0)
HCT: 40 % (ref 36.0–46.0)
Hemoglobin: 13.6 g/dL (ref 12.0–15.0)
Lymphocytes Relative: 21.9 % (ref 12.0–46.0)
Lymphs Abs: 1 10*3/uL (ref 0.7–4.0)
MCHC: 33.9 g/dL (ref 30.0–36.0)
MCV: 92.6 fl (ref 78.0–100.0)
Monocytes Absolute: 0.3 10*3/uL (ref 0.1–1.0)
Monocytes Relative: 6.9 % (ref 3.0–12.0)
Neutro Abs: 3 10*3/uL (ref 1.4–7.7)
Neutrophils Relative %: 67.5 % (ref 43.0–77.0)
Platelets: 236 10*3/uL (ref 150.0–400.0)
RBC: 4.32 Mil/uL (ref 3.87–5.11)
RDW: 13.4 % (ref 11.5–15.5)
WBC: 4.4 10*3/uL (ref 4.0–10.5)

## 2023-01-15 LAB — HEMOGLOBIN A1C: Hgb A1c MFr Bld: 5.8 % (ref 4.6–6.5)

## 2023-01-15 LAB — TSH: TSH: 2.06 u[IU]/mL (ref 0.35–5.50)

## 2023-01-17 ENCOUNTER — Ambulatory Visit (INDEPENDENT_AMBULATORY_CARE_PROVIDER_SITE_OTHER): Payer: Medicare PPO | Admitting: Internal Medicine

## 2023-01-17 ENCOUNTER — Encounter: Payer: Self-pay | Admitting: Internal Medicine

## 2023-01-17 VITALS — BP 112/70 | HR 78 | Temp 97.9°F | Resp 16 | Ht 64.0 in | Wt 181.0 lb

## 2023-01-17 DIAGNOSIS — E7800 Pure hypercholesterolemia, unspecified: Secondary | ICD-10-CM | POA: Diagnosis not present

## 2023-01-17 DIAGNOSIS — R739 Hyperglycemia, unspecified: Secondary | ICD-10-CM

## 2023-01-17 DIAGNOSIS — R600 Localized edema: Secondary | ICD-10-CM | POA: Diagnosis not present

## 2023-01-17 DIAGNOSIS — M25562 Pain in left knee: Secondary | ICD-10-CM | POA: Diagnosis not present

## 2023-01-17 DIAGNOSIS — J4520 Mild intermittent asthma, uncomplicated: Secondary | ICD-10-CM | POA: Diagnosis not present

## 2023-01-17 DIAGNOSIS — F439 Reaction to severe stress, unspecified: Secondary | ICD-10-CM | POA: Diagnosis not present

## 2023-01-17 DIAGNOSIS — Z86010 Personal history of colonic polyps: Secondary | ICD-10-CM | POA: Diagnosis not present

## 2023-01-17 DIAGNOSIS — K219 Gastro-esophageal reflux disease without esophagitis: Secondary | ICD-10-CM

## 2023-01-17 MED ORDER — ESOMEPRAZOLE MAGNESIUM 40 MG PO CPDR: 40.0000 mg | DELAYED_RELEASE_CAPSULE | Freq: Every day | ORAL | 3 refills | Status: DC

## 2023-01-17 NOTE — Progress Notes (Signed)
Subjective:    Patient ID: Lori Guerrero, female    DOB: 1951-05-19, 72 y.o.   MRN: [id number]  Patient here for  Chief Complaint  Patient presents with   Medical Management of Chronic Issues    HPI Here to follow up regarding hypercholesterolemia, GERD and hyperglycemia. Saw ortho 10/2022 - left knee pain.  S/p injection. Saw pulmonary 11/2022 - evaluation - wheezing. Recommended albuterol and PFTs. PFTs - normal. Had f/u 01/08/23.  Wheezing resolved.  Reported - felt to have mild intermittent asthma.  Recommended to continue nexium and pepcid.  Saline nasal spray prn, claritin prn and albuterol prn.  She is taking nexium.  Has pepcid if needed.  States acid reflux is controlled if she takes nexium regularly and watches what she eats. Some occasional congestion.  Discussed taking antihistamines prn.  Also discussed using saline nasal spray.  No chest pain or sob reported.  No abdominal pain or bowel change reported.  Reported history of remote left knee injury.  Will notice some intermittent pain and sensation change.  Discussed PT.  Also reports some intermittent swelling right foot.  No pain.  No swelling extending up the leg.  Has seen podiatry. Discussed compression hose. Reports stiffness - hands and feet.    Past Medical History:  Diagnosis Date   GERD (gastroesophageal reflux disease)    Hyperglycemia    Hyperplastic colon polyp    Skin cancer    Vitamin D deficiency    Past Surgical History:  Procedure Laterality Date   BREAST BIOPSY     25 years ago   MOHS SURGERY     skin cancer   Family History  Problem Relation Age of Onset   Heart disease Mother    Cancer Paternal Grandfather        colon   Cancer Paternal Aunt        Breast cancer   Breast cancer Paternal Aunt 58   Breast cancer Maternal Aunt    Diabetes Maternal Grandmother    Social History   Socioeconomic History   Marital status: Widowed    Spouse name: Not on file   Number of children: 1   Years of  education: Not on file   Highest education level: 12th grade  Occupational History   Not on file  Tobacco Use   Smoking status: Never    Passive exposure: Past (Husband did smoke.)   Smokeless tobacco: Never  Vaping Use   Vaping status: Never Used  Substance and Sexual Activity   Alcohol use: Yes    Alcohol/week: 0.0 standard drinks of alcohol    Comment: occassionally   Drug use: No   Sexual activity: Not Currently    Comment: 1st intercourse- 19, partners- 2, widow   Other Topics Concern   Not on file  Social History Narrative   Not on file   Social Determinants of Health   Financial Resource Strain: Low Risk  (01/16/2023)   Overall Financial Resource Strain (CARDIA)    Difficulty of Paying Living Expenses: Not hard at all  Food Insecurity: No Food Insecurity (01/16/2023)   Hunger Vital Sign    Worried About Running Out of Food in the Last Year: Never true    Ran Out of Food in the Last Year: Never true  Transportation Needs: No Transportation Needs (01/16/2023)   PRAPARE - Administrator, Civil Service (Medical): No    Lack of Transportation (Non-Medical): No  Physical Activity: Sufficiently Active (01/16/2023)  Exercise Vital Sign    Days of Exercise per Week: 7 days    Minutes of Exercise per Session: 30 min  Stress: Stress Concern Present (01/16/2023)   [employer] of Occupational Health - Occupational Stress Questionnaire    Feeling of Stress : To some extent  Social Connections: Moderately Integrated (01/16/2023)   Social Connection and Isolation Panel [NHANES]    Frequency of Communication with Friends and Family: More than three times a week    Frequency of Social Gatherings with Friends and Family: More than three times a week    Attends Religious Services: More than 4 times per year    Active Member of [employer] or Organizations: Yes    Attends Banker Meetings: 1 to 4 times per year    Marital Status: Widowed     Review of Systems   Constitutional:  Negative for appetite change and unexpected weight change.  HENT:  Negative for congestion and sinus pressure.   Respiratory:  Negative for cough, chest tightness and shortness of breath.   Cardiovascular:  Negative for chest pain, palpitations and leg swelling.  Gastrointestinal:  Negative for abdominal pain, diarrhea, nausea and vomiting.  Genitourinary:  Negative for difficulty urinating and dysuria.  Musculoskeletal:  Negative for myalgias.       Joint stiffness.  Left knee pain as outlined.   Skin:  Negative for color change and rash.  Neurological:  Negative for dizziness and headaches.  Psychiatric/Behavioral:  Negative for agitation and dysphoric mood.        Increased stress with mother's health issues.        Objective:     BP 112/70   Pulse 78   Temp 97.9 F (36.6 C)   Resp 16   Ht 5\' 4"  (1.626 m)   Wt 181 lb (82.1 kg)   SpO2 97%   BMI 31.07 kg/m  Wt Readings from Last 3 Encounters:  01/17/23 181 lb (82.1 kg)  01/08/23 183 lb 9.6 oz (83.3 kg)  11/15/22 183 lb (83 kg)    Physical Exam Vitals reviewed.  Constitutional:      General: She is not in acute distress.    Appearance: Normal appearance.  HENT:     Head: Normocephalic and atraumatic.     Right Ear: External ear normal.     Left Ear: External ear normal.  Eyes:     General: No scleral icterus.       Right eye: No discharge.        Left eye: No discharge.     Conjunctiva/sclera: Conjunctivae normal.  Neck:     Thyroid: No thyromegaly.  Cardiovascular:     Rate and Rhythm: Normal rate and regular rhythm.  Pulmonary:     Effort: No respiratory distress.     Breath sounds: Normal breath sounds. No wheezing.  Abdominal:     General: Bowel sounds are normal.     Palpations: Abdomen is soft.     Tenderness: There is no abdominal tenderness.  Musculoskeletal:        General: No swelling or tenderness.     Cervical back: Neck supple. No tenderness.     Comments: DP pulses  palpable and equal bilaterally.   Lymphadenopathy:     Cervical: No cervical adenopathy.  Skin:    Findings: No erythema or rash.  Neurological:     Mental Status: She is alert.  Psychiatric:        Mood and Affect: Mood normal.  Behavior: Behavior normal.      Outpatient Encounter Medications as of 01/17/2023  Medication Sig   albuterol (VENTOLIN HFA) 108 (90 Base) MCG/ACT inhaler Inhale 2 puffs into the lungs every 6 (six) hours as needed.   esomeprazole (NEXIUM) 40 MG capsule Take 1 capsule (40 mg total) by mouth daily.   famotidine (PEPCID) 20 MG tablet Take 1 tablet (20 mg total) by mouth daily before supper.   Multiple Vitamins-Minerals (MULTIVITAMIN PO) Take by mouth daily.   [DISCONTINUED] esomeprazole (NEXIUM) 40 MG capsule Take 40 mg by mouth daily at 12 noon.   No facility-administered encounter medications on file as of 01/17/2023.     Lab Results  Component Value Date   WBC 4.4 01/15/2023   HGB 13.6 01/15/2023   HCT 40.0 01/15/2023   PLT 236.0 01/15/2023   GLUCOSE 113 (H) 01/15/2023   CHOL 174 01/15/2023   TRIG 125.0 01/15/2023   HDL 48.20 01/15/2023   LDLCALC 101 (H) 01/15/2023   ALT 21 01/15/2023   AST 21 01/15/2023   NA 141 01/15/2023   K 4.2 01/15/2023   CL 109 01/15/2023   CREATININE 0.83 01/15/2023   BUN 18 01/15/2023   CO2 23 01/15/2023   TSH 2.06 01/15/2023   HGBA1C 5.8 01/15/2023   MICROALBUR <0.7 02/07/2018    MR BRAIN/IAC W WO CONTRAST  Result Date: 12/14/2022 CLINICAL DATA:  Provided history: Anosmia. EXAM: MRI HEAD WITHOUT AND WITH CONTRAST TECHNIQUE: Multiplanar, multiecho pulse sequences of the brain and surrounding structures were obtained without and with intravenous contrast. CONTRAST:  8.5 mL Vueway intravenous contrast. COMPARISON:  None. FINDINGS: Brain: No age advanced or lobar predominant parenchymal atrophy. No cortical encephalomalacia is identified. No significant cerebral white matter disease. There is no acute infarct. No  evidence of an intracranial mass. No extra-axial fluid collection. No midline shift. No pathologic intracranial enhancement identified. Vascular: Maintained flow voids within the proximal large arterial vessels. Skull and upper cervical spine: No focal suspicious marrow lesion. Incompletely assessed cervical spondylosis. Sinuses/Orbits: No mass or acute finding within the imaged orbits. No significant paranasal sinus disease. IMPRESSION: Unremarkable MRI appearance of the brain. No evidence of an acute intracranial abnormality. No cause of anosmia is identified. Electronically Signed   By: Jackey Loge D.O.   On: 12/14/2022 18:07       Assessment & Plan:  Hypercholesterolemia Assessment & Plan: The 10-year ASCVD risk score (Arnett DK, et al., 2019) is: 7.3%   Values used to calculate the score:     Age: 3 years     Sex: Female     Is Non-Hispanic African American: No     Diabetic: No     Tobacco smoker: No     Systolic Blood Pressure: 112 mmHg     Is BP treated: No     HDL Cholesterol: 48.2 mg/dL     Total Cholesterol: 174 mg/dL  Have discussed labs.  Discussed calculated cholesterol risk and recommendation to start cholesterol medication.  Low cholesterol diet and exercise.  Follow lipid panel.   Orders: -     Lipid panel; Future -     Hepatic function panel; Future -     Basic metabolic panel; Future  Hyperglycemia Assessment & Plan: Low carb diet and exercise.  Follow met b and a1c.   Orders: -     Hemoglobin A1c; Future  Mild intermittent asthma, unspecified whether complicated Assessment & Plan: Saw pulmonary 11/2022 - evaluation - wheezing. Recommended albuterol and PFTs. PFTs -  normal. Had f/u 01/08/23.  Wheezing resolved.  Reported - felt to have mild intermittent asthma. Mild intermittent asthma- improved control on regimen aimed at chronic rhinitis and GERD prevention    Plan  Patient Instructions  Continue on Nexium and Pepcid.  Saline nasal rinses As needed    Ryaltris nasal As needed   Claritin daily As needed   Albuterol inhaler As needed   Follow up with Dr. Jayme Cloud in 1 year and As needed     Gastroesophageal reflux disease, unspecified whether esophagitis present Assessment & Plan: She is taking nexium.  Has pepcid if needed.  States acid reflux is controlled if she takes nexium regularly and watches what she eats.    History of colonic polyps Assessment & Plan: Colonoscopy 10/20/19 - two diminutive polyps in the rectum. Non bleeding internal hemorrhoids. hyperplastic polyp.  Recommended f/u colonoscopy in 5 years.    Stress Assessment & Plan: Increased stress with her mother's health issues.  Discussed.  Will notify if feels needs further intervention.    Pedal edema Assessment & Plan: Reported intermittent swelling right foot.  No pain.  No swelling extending up extremities.  Better in am.  Discussed leg/feet elevation.  Discussed compression hose.  Follow.    Left knee pain, unspecified chronicity Assessment & Plan: Reported history of remote left knee injury.  Will notice some intermittent pain and sensation change.  Discussed PT.  Notify me if desires PT referral.    Other orders -     Esomeprazole Magnesium; Take 1 capsule (40 mg total) by mouth daily.  Dispense: 90 capsule; Refill: 3   I spent 45 minutes with the patient. Time spent discussing her current concerns and symptoms.  Specifically time spent discussing increased stress, knee pain and her breathing/cough issues. Time also spent discussing further w/up, evaluation and treatment.    Dale Timberlane, MD

## 2023-01-21 ENCOUNTER — Encounter: Payer: Self-pay | Admitting: Internal Medicine

## 2023-01-21 DIAGNOSIS — M25562 Pain in left knee: Secondary | ICD-10-CM | POA: Insufficient documentation

## 2023-01-21 DIAGNOSIS — R600 Localized edema: Secondary | ICD-10-CM | POA: Insufficient documentation

## 2023-01-21 NOTE — Assessment & Plan Note (Signed)
The 10-year ASCVD risk score (Arnett DK, et al., 2019) is: 7.3%   Values used to calculate the score:     Age: 72 years     Sex: Female     Is Non-Hispanic African American: No     Diabetic: No     Tobacco smoker: No     Systolic Blood Pressure: 112 mmHg     Is BP treated: No     HDL Cholesterol: 48.2 mg/dL     Total Cholesterol: 174 mg/dL  Have discussed labs.  Discussed calculated cholesterol risk and recommendation to start cholesterol medication.  Low cholesterol diet and exercise.  Follow lipid panel.

## 2023-01-21 NOTE — Assessment & Plan Note (Signed)
Saw pulmonary 11/2022 - evaluation - wheezing. Recommended albuterol and PFTs. PFTs - normal. Had f/u 01/08/23.  Wheezing resolved.  Reported - felt to have mild intermittent asthma. Mild intermittent asthma- improved control on regimen aimed at chronic rhinitis and GERD prevention    Plan  Patient Instructions  Continue on Nexium and Pepcid.  Saline nasal rinses As needed   Ryaltris nasal As needed   Claritin daily As needed   Albuterol inhaler As needed   Follow up with Dr. Jayme Cloud in 1 year and As needed

## 2023-01-21 NOTE — Assessment & Plan Note (Signed)
Colonoscopy 10/20/19 - two diminutive polyps in the rectum. Non bleeding internal hemorrhoids. hyperplastic polyp.  Recommended f/u colonoscopy in 5 years.

## 2023-01-21 NOTE — Assessment & Plan Note (Signed)
Increased stress with her mother's health issues.  Discussed.  Will notify if feels needs further intervention.

## 2023-01-21 NOTE — Assessment & Plan Note (Signed)
She is taking nexium.  Has pepcid if needed.  States acid reflux is controlled if she takes nexium regularly and watches what she eats.

## 2023-01-21 NOTE — Assessment & Plan Note (Signed)
Reported history of remote left knee injury.  Will notice some intermittent pain and sensation change.  Discussed PT.  Notify me if desires PT referral.

## 2023-01-21 NOTE — Assessment & Plan Note (Signed)
Reported intermittent swelling right foot.  No pain.  No swelling extending up extremities.  Better in am.  Discussed leg/feet elevation.  Discussed compression hose.  Follow.

## 2023-01-21 NOTE — Assessment & Plan Note (Signed)
Low carb diet and exercise.  Follow met b and a1c.   

## 2023-03-23 DIAGNOSIS — X32XXXA Exposure to sunlight, initial encounter: Secondary | ICD-10-CM | POA: Diagnosis not present

## 2023-03-23 DIAGNOSIS — L570 Actinic keratosis: Secondary | ICD-10-CM | POA: Diagnosis not present

## 2023-04-19 DIAGNOSIS — M79671 Pain in right foot: Secondary | ICD-10-CM | POA: Diagnosis not present

## 2023-04-19 DIAGNOSIS — M79672 Pain in left foot: Secondary | ICD-10-CM | POA: Diagnosis not present

## 2023-04-19 DIAGNOSIS — G609 Hereditary and idiopathic neuropathy, unspecified: Secondary | ICD-10-CM | POA: Diagnosis not present

## 2023-05-03 ENCOUNTER — Ambulatory Visit: Payer: Medicare PPO | Admitting: Emergency Medicine

## 2023-05-03 VITALS — Ht 64.0 in | Wt 177.0 lb

## 2023-05-03 DIAGNOSIS — Z1231 Encounter for screening mammogram for malignant neoplasm of breast: Secondary | ICD-10-CM | POA: Diagnosis not present

## 2023-05-03 DIAGNOSIS — Z0000 Encounter for general adult medical examination without abnormal findings: Secondary | ICD-10-CM

## 2023-05-03 NOTE — Patient Instructions (Addendum)
Lori Guerrero , Thank you for taking time to come for your Medicare Wellness Visit. I appreciate your ongoing commitment to your health goals. Please review the following plan we discussed and let me know if I can assist you in the future.   Referrals/Orders/Follow-Ups/Clinician Recommendations: I have placed an order for a mammogram that is due after 07/13/23. Call [facility] @ [phone] to schedule.  This is a list of the screening recommended for you and due dates:  Health Maintenance  Topic Date Due   Zoster (Shingles) Vaccine (1 of 2) Never done   COVID-19 Vaccine (3 - Pfizer risk series) 10/14/2019   Pneumonia Vaccine (1 of 1 - PCV) 09/20/2023*   Flu Shot  10/01/2023*   Mammogram  07/13/2023   Medicare Annual Wellness Visit  05/02/2024   DEXA scan (bone density measurement)  06/22/2024   DTaP/Tdap/Td vaccine (2 - Td or Tdap) 06/23/2024   Colon Cancer Screening  10/16/2024   Hepatitis C Screening  Completed   HPV Vaccine  Aged Out  *Topic was postponed. The date shown is not the original due date.    Advanced directives: (ACP Link)Information on Advanced Care Planning can be found at [facility] Advance Health Care Directives Advance Health Care Directives ([email or web address]   Once you have completed the forms, please bring a copy of your health care power of attorney and living will to the office to be added to your chart at your convenience.   Next Medicare Annual Wellness Visit scheduled for next year: Yes, 05/09/24 @ 9:45am

## 2023-05-03 NOTE — Progress Notes (Signed)
Subjective:   Lori Guerrero is a 72 y.o. female who presents for an Initial Medicare Annual Wellness Visit.  Visit Complete: Virtual I connected with  Lori Guerrero on 05/03/23 by a audio enabled telemedicine application and verified that I am speaking with the correct person using two identifiers.  Patient Location: Home  Provider Location: Home Office  I discussed the limitations of evaluation and management by telemedicine. The patient expressed understanding and agreed to proceed.  Vital Signs: Because this visit was a virtual/telehealth visit, some criteria may be missing or patient reported. Any vitals not documented were not able to be obtained and vitals that have been documented are patient reported.   Cardiac Risk Factors include: advanced age (>28men, >83 women);dyslipidemia;obesity (BMI >30kg/m2)     Objective:    Today's Vitals   05/03/23 1550  Weight: 177 lb (80.3 kg)  Height: 5\' 4"  (1.626 m)   Body mass index is 30.38 kg/m.     05/03/2023    4:16 PM 12/15/2021   11:08 AM  Advanced Directives  Does Patient Have a Medical Advance Directive? No No  Would patient like information on creating a medical advance directive? Yes (MAU/Ambulatory/Procedural Areas - Information given) No - Patient declined    Current Medications (verified) Outpatient Encounter Medications as of 05/03/2023  Medication Sig   esomeprazole (NEXIUM) 40 MG capsule Take 1 capsule (40 mg total) by mouth daily.   famotidine (PEPCID) 20 MG tablet Take 1 tablet (20 mg total) by mouth daily before supper.   Multiple Vitamins-Minerals (MULTIVITAMIN PO) Take by mouth daily.   albuterol (VENTOLIN HFA) 108 (90 Base) MCG/ACT inhaler Inhale 2 puffs into the lungs every 6 (six) hours as needed. (Patient not taking: Reported on 05/03/2023)   fluorouracil (EFUDEX) 5 % cream Apply 1 Application topically 2 (two) times daily. (Patient not taking: Reported on 05/03/2023)   No facility-administered  encounter medications on file as of 05/03/2023.    Allergies (verified) Patient has no known allergies.   History: Past Medical History:  Diagnosis Date   GERD (gastroesophageal reflux disease)    Hyperglycemia    Hyperplastic colon polyp    Skin cancer    Vitamin D deficiency    Past Surgical History:  Procedure Laterality Date   BREAST BIOPSY     25 years ago   MOHS SURGERY     skin cancer   Family History  Problem Relation Age of Onset   Heart disease Mother        irregular heart beat   Other Father        [place] Lori Guerrero Disease (CJD)   Biliary Cirrhosis Sister    Breast cancer Maternal Aunt    Breast cancer Paternal Aunt 33   Diabetes Maternal Grandmother    Cancer Paternal Grandfather        colon   Social History   Socioeconomic History   Marital status: Widowed    Spouse name: Not on file   Number of children: 1   Years of education: Not on file   Highest education level: 12th grade  Occupational History   Occupation: retired  Tobacco Use   Smoking status: Never    Passive exposure: Past (Husband did smoke.)   Smokeless tobacco: Never  Vaping Use   Vaping status: Never Used  Substance and Sexual Activity   Alcohol use: Yes    Alcohol/week: 4.0 standard drinks of alcohol    Types: 4 Cans of beer per week  Comment: 1-2 beers once twice a week   Drug use: No   Sexual activity: Not Currently    Comment: 1st intercourse- 19, partners- 2, widow   Other Topics Concern   Not on file  Social History Narrative   Not on file   Social Determinants of Health   Financial Resource Strain: Low Risk  (05/03/2023)   Overall Financial Resource Strain (CARDIA)    Difficulty of Paying Living Expenses: Not hard at all  Food Insecurity: No Food Insecurity (05/03/2023)   Hunger Vital Sign    Worried About Running Out of Food in the Last Year: Never true    Ran Out of Food in the Last Year: Never true  Transportation Needs: No Transportation Needs  (05/03/2023)   PRAPARE - Administrator, Civil Service (Medical): No    Lack of Transportation (Non-Medical): No  Physical Activity: Insufficiently Active (05/03/2023)   Exercise Vital Sign    Days of Exercise per Week: 2 days    Minutes of Exercise per Session: 40 min  Stress: Stress Concern Present (05/03/2023)   [employer] of Occupational Health - Occupational Stress Questionnaire    Feeling of Stress : To some extent  Social Connections: Moderately Isolated (05/03/2023)   Social Connection and Isolation Panel [NHANES]    Frequency of Communication with Friends and Family: More than three times a week    Frequency of Social Gatherings with Friends and Family: More than three times a week    Attends Religious Services: More than 4 times per year    Active Member of [employer] or Organizations: No    Attends Banker Meetings: Never    Marital Status: Widowed    Tobacco Counseling Counseling given: Not Answered   Clinical Intake:  Pre-visit preparation completed: Yes  Pain : No/denies pain     BMI - recorded: 30.38 Nutritional Status: BMI > 30  Obese Nutritional Risks: None Diabetes: No  How often do you need to have someone help you when you read instructions, pamphlets, or other written materials from your doctor or pharmacy?: 1 - Never  Interpreter Needed?: No  Information entered by :: Tora Kindred, CMA   Activities of Daily Living    05/03/2023    3:52 PM  In your present state of health, do you have any difficulty performing the following activities:  Hearing? 0  Vision? 0  Difficulty concentrating or making decisions? 0  Walking or climbing stairs? 0  Dressing or bathing? 0  Doing errands, shopping? 0  Preparing Food and eating ? N  Using the Toilet? N  In the past six months, have you accidently leaked urine? N  Do you have problems with loss of bowel control? N  Managing your Medications? N  Managing your Finances? N   Housekeeping or managing your Housekeeping? N    Patient Care Team: Dale Terral, MD as PCP - General (Internal Medicine)  Indicate any recent Medical Services you may have received from other than [facility] providers in the past year (date may be approximate).     Assessment:   This is a routine wellness examination for Lori Guerrero.  Hearing/Vision screen Hearing Screening - Comments:: Denies hearing loss Vision Screening - Comments:: Gets eye exams   Goals Addressed               This Visit's Progress     Patient Stated (pt-stated)        Increase exercise  Depression Screen    05/03/2023    4:13 PM 08/31/2022    4:21 PM 04/13/2022    7:14 AM 11/21/2021   10:37 AM 08/23/2021   11:45 AM 01/18/2021    9:49 AM 05/18/2020    3:10 PM  PHQ 2/9 Scores  PHQ - 2 Score 0 0 0 0 0 0 0  PHQ- 9 Score 0 0         Fall Risk    05/03/2023    4:17 PM 08/31/2022    4:20 PM 04/13/2022    7:14 AM 11/21/2021   10:37 AM 08/23/2021   11:44 AM  Fall Risk   Falls in the past year? 0 1 0 1 0  Number falls in past yr: 0 0  0   Injury with Fall? 0 1  1   Risk for fall due to : No Fall Risks History of fall(s) No Fall Risks Impaired balance/gait No Fall Risks  Follow up Falls prevention discussed Falls evaluation completed Falls evaluation completed Falls evaluation completed Falls evaluation completed    MEDICARE RISK AT HOME: Medicare Risk at Home Any stairs in or around the home?: Yes If so, are there any without handrails?: No Home free of loose throw rugs in walkways, pet beds, electrical cords, etc?: Yes Adequate lighting in your home to reduce risk of falls?: Yes Life alert?: No Use of a cane, walker or w/c?: No Grab bars in the bathroom?: No Shower chair or bench in shower?: No Elevated toilet seat or a handicapped toilet?: Yes  TIMED UP AND GO:  Was the test performed? No    Cognitive Function:        05/03/2023    4:18 PM  6CIT Screen  What Year? 0 points   What month? 0 points  What time? 0 points  Count back from 20 0 points  Months in reverse 0 points  Repeat phrase 0 points  Total Score 0 points    Immunizations Immunization History  Administered Date(s) Administered   Influenza-Unspecified 07/15/2009   PFIZER(Purple Top)SARS-COV-2 Vaccination 08/23/2019, 09/16/2019   Tdap 06/23/2014    TDAP status: Up to date  Flu Vaccine status: Declined, Education has been provided regarding the importance of this vaccine but patient still declined. Advised may receive this vaccine at local pharmacy or Health Dept. Aware to provide a copy of the vaccination record if obtained from local pharmacy or Health Dept. Verbalized acceptance and understanding.  Pneumococcal vaccine status: Declined,  Education has been provided regarding the importance of this vaccine but patient still declined. Advised may receive this vaccine at local pharmacy or Health Dept. Aware to provide a copy of the vaccination record if obtained from local pharmacy or Health Dept. Verbalized acceptance and understanding.   Covid-19 vaccine status: Declined, Education has been provided regarding the importance of this vaccine but patient still declined. Advised may receive this vaccine at local pharmacy or Health Dept.or vaccine clinic. Aware to provide a copy of the vaccination record if obtained from local pharmacy or Health Dept. Verbalized acceptance and understanding.  Qualifies for Shingles Vaccine? Yes   Zostavax completed No   Shingrix Completed?: No.    Education has been provided regarding the importance of this vaccine. Patient has been advised to call insurance company to determine out of pocket expense if they have not yet received this vaccine. Advised may also receive vaccine at local pharmacy or Health Dept. Verbalized acceptance and understanding.  Screening Tests Health Maintenance  Topic  Date Due   Zoster Vaccines- Shingrix (1 of 2) Never done   COVID-19  Vaccine (3 - Pfizer risk series) 10/14/2019   INFLUENZA VACCINE  02/01/2023   Pneumonia Vaccine 60+ Years old (1 of 1 - PCV) 09/20/2023 (Originally 02/22/2017)   MAMMOGRAM  07/13/2023   Medicare Annual Wellness (AWV)  05/02/2024   DEXA SCAN  06/22/2024   DTaP/Tdap/Td (2 - Td or Tdap) 06/23/2024   Colonoscopy  10/16/2024   Hepatitis C Screening  Completed   HPV VACCINES  Aged Out    Health Maintenance  Health Maintenance Due  Topic Date Due   Zoster Vaccines- Shingrix (1 of 2) Never done   COVID-19 Vaccine (3 - Pfizer risk series) 10/14/2019   INFLUENZA VACCINE  02/01/2023    Colorectal cancer screening: Type of screening: Colonoscopy. Completed 10/17/19. Repeat every 5 years  Mammogram status: Completed 07/12/22. Repeat every year  Bone Density status: Completed 06/22/09. Results reflect: Bone density results: NORMAL. Repeat every 5 years.  Lung Cancer Screening: (Low Dose CT Chest recommended if Age 80-80 years, 20 pack-year currently smoking OR have quit w/in 15years.) does not qualify.   Lung Cancer Screening Referral: n/a  Additional Screening:  Hepatitis C Screening: does not qualify; Completed 04/25/16  Vision Screening: Recommended annual ophthalmology exams for early detection of glaucoma and other disorders of the eye.  Dental Screening: Recommended annual dental exams for proper oral hygiene   Community Resource Referral / Chronic Care Management: CRR required this visit?  No   CCM required this visit?  No     Plan:     I have personally reviewed and noted the following in the patient's chart:   Medical and social history Use of alcohol, tobacco or illicit drugs  Current medications and supplements including opioid prescriptions. Patient is not currently taking opioid prescriptions. Functional ability and status Nutritional status Physical activity Advanced directives List of other physicians Hospitalizations, surgeries, and ER visits in previous 12  months Vitals Screenings to include cognitive, depression, and falls Referrals and appointments  In addition, I have reviewed and discussed with patient certain preventive protocols, quality metrics, and best practice recommendations. A written personalized care plan for preventive services as well as general preventive health recommendations were provided to patient.     Tora Kindred, CMA   05/03/2023   After Visit Summary: (MyChart) Due to this being a telephonic visit, the after visit summary with patients personalized plan was offered to patient via MyChart   Nurse Notes:  Declined flu, pneumonia, covid and shingles vaccines. Placed order for MMG due after 07/13/23.

## 2023-05-18 ENCOUNTER — Other Ambulatory Visit (INDEPENDENT_AMBULATORY_CARE_PROVIDER_SITE_OTHER): Payer: Medicare PPO

## 2023-05-18 DIAGNOSIS — E7800 Pure hypercholesterolemia, unspecified: Secondary | ICD-10-CM | POA: Diagnosis not present

## 2023-05-18 DIAGNOSIS — R739 Hyperglycemia, unspecified: Secondary | ICD-10-CM

## 2023-05-18 LAB — HEPATIC FUNCTION PANEL
ALT: 19 U/L (ref 0–35)
AST: 22 U/L (ref 0–37)
Albumin: 3.9 g/dL (ref 3.5–5.2)
Alkaline Phosphatase: 113 U/L (ref 39–117)
Bilirubin, Direct: 0.1 mg/dL (ref 0.0–0.3)
Total Bilirubin: 0.4 mg/dL (ref 0.2–1.2)
Total Protein: 6.3 g/dL (ref 6.0–8.3)

## 2023-05-18 LAB — LIPID PANEL
Cholesterol: 185 mg/dL (ref 0–200)
HDL: 50.6 mg/dL (ref >39.00)
LDL Cholesterol: 120 mg/dL — ABNORMAL HIGH (ref 0–99)
NonHDL: 134.81
Total CHOL/HDL Ratio: 4
Triglycerides: 75 mg/dL (ref 0.0–149.0)
VLDL: 15 mg/dL (ref 0.0–40.0)

## 2023-05-18 LAB — HEMOGLOBIN A1C: Hgb A1c MFr Bld: 6.1 % (ref 4.6–6.5)

## 2023-05-18 LAB — BASIC METABOLIC PANEL
BUN: 20 mg/dL (ref 6–23)
CO2: 25 meq/L (ref 19–32)
Calcium: 9.1 mg/dL (ref 8.4–10.5)
Chloride: 109 meq/L (ref 96–112)
Creatinine, Ser: 0.89 mg/dL (ref 0.40–1.20)
GFR: 65.32 mL/min (ref >60.00)
Glucose, Bld: 119 mg/dL — ABNORMAL HIGH (ref 70–99)
Potassium: 4.1 meq/L (ref 3.5–5.1)
Sodium: 142 meq/L (ref 135–145)

## 2023-05-19 ENCOUNTER — Encounter: Payer: Self-pay | Admitting: Internal Medicine

## 2023-05-21 NOTE — Telephone Encounter (Signed)
Please notify - according to the last pulmonary note - dx - mild asthma.  Also, the last cbc was wnl. I have contacted lab to see if can add cbc to blood drawn.

## 2023-05-22 ENCOUNTER — Other Ambulatory Visit: Payer: Self-pay | Admitting: Internal Medicine

## 2023-05-22 ENCOUNTER — Other Ambulatory Visit (INDEPENDENT_AMBULATORY_CARE_PROVIDER_SITE_OTHER): Payer: Medicare PPO

## 2023-05-22 DIAGNOSIS — E7800 Pure hypercholesterolemia, unspecified: Secondary | ICD-10-CM | POA: Diagnosis not present

## 2023-05-22 LAB — CBC WITH DIFFERENTIAL/PLATELET
Basophils Absolute: 0 10*3/uL (ref 0.0–0.1)
Basophils Relative: 1 % (ref 0.0–3.0)
Eosinophils Absolute: 0.1 10*3/uL (ref 0.0–0.7)
Eosinophils Relative: 2.5 % (ref 0.0–5.0)
HCT: 40.9 % (ref 36.0–46.0)
Hemoglobin: 13.5 g/dL (ref 12.0–15.0)
Lymphocytes Relative: 23 % (ref 12.0–46.0)
Lymphs Abs: 1 10*3/uL (ref 0.7–4.0)
MCHC: 33 g/dL (ref 30.0–36.0)
MCV: 95.5 fL (ref 78.0–100.0)
Monocytes Absolute: 0.2 10*3/uL (ref 0.1–1.0)
Monocytes Relative: 5.9 % (ref 3.0–12.0)
Neutro Abs: 2.8 10*3/uL (ref 1.4–7.7)
Neutrophils Relative %: 67.6 % (ref 43.0–77.0)
Platelets: 242 10*3/uL (ref 150.0–400.0)
RBC: 4.28 Mil/uL (ref 3.87–5.11)
RDW: 13.4 % (ref 11.5–15.5)
WBC: 4.2 10*3/uL (ref 4.0–10.5)

## 2023-05-22 NOTE — Progress Notes (Signed)
Order placed for f/u cbc.   

## 2023-05-23 ENCOUNTER — Ambulatory Visit (INDEPENDENT_AMBULATORY_CARE_PROVIDER_SITE_OTHER): Payer: Medicare PPO | Admitting: Internal Medicine

## 2023-05-23 VITALS — BP 122/70 | HR 66 | Temp 97.9°F | Resp 16 | Ht 64.0 in | Wt 180.0 lb

## 2023-05-23 DIAGNOSIS — L818 Other specified disorders of pigmentation: Secondary | ICD-10-CM | POA: Diagnosis not present

## 2023-05-23 DIAGNOSIS — L814 Other melanin hyperpigmentation: Secondary | ICD-10-CM | POA: Diagnosis not present

## 2023-05-23 DIAGNOSIS — Z860100 Personal history of colon polyps, unspecified: Secondary | ICD-10-CM

## 2023-05-23 DIAGNOSIS — E7800 Pure hypercholesterolemia, unspecified: Secondary | ICD-10-CM | POA: Diagnosis not present

## 2023-05-23 DIAGNOSIS — R232 Flushing: Secondary | ICD-10-CM

## 2023-05-23 DIAGNOSIS — M79671 Pain in right foot: Secondary | ICD-10-CM

## 2023-05-23 DIAGNOSIS — Z86006 Personal history of melanoma in-situ: Secondary | ICD-10-CM | POA: Diagnosis not present

## 2023-05-23 DIAGNOSIS — R062 Wheezing: Secondary | ICD-10-CM

## 2023-05-23 DIAGNOSIS — K219 Gastro-esophageal reflux disease without esophagitis: Secondary | ICD-10-CM | POA: Diagnosis not present

## 2023-05-23 DIAGNOSIS — Z08 Encounter for follow-up examination after completed treatment for malignant neoplasm: Secondary | ICD-10-CM | POA: Diagnosis not present

## 2023-05-23 DIAGNOSIS — Z7189 Other specified counseling: Secondary | ICD-10-CM | POA: Diagnosis not present

## 2023-05-23 DIAGNOSIS — F439 Reaction to severe stress, unspecified: Secondary | ICD-10-CM

## 2023-05-23 DIAGNOSIS — Z85828 Personal history of other malignant neoplasm of skin: Secondary | ICD-10-CM | POA: Diagnosis not present

## 2023-05-23 DIAGNOSIS — E042 Nontoxic multinodular goiter: Secondary | ICD-10-CM | POA: Diagnosis not present

## 2023-05-23 DIAGNOSIS — L738 Other specified follicular disorders: Secondary | ICD-10-CM | POA: Diagnosis not present

## 2023-05-23 DIAGNOSIS — R739 Hyperglycemia, unspecified: Secondary | ICD-10-CM | POA: Diagnosis not present

## 2023-05-23 DIAGNOSIS — L853 Xerosis cutis: Secondary | ICD-10-CM | POA: Diagnosis not present

## 2023-05-23 DIAGNOSIS — Z85820 Personal history of malignant melanoma of skin: Secondary | ICD-10-CM | POA: Diagnosis not present

## 2023-05-23 MED ORDER — ESOMEPRAZOLE MAGNESIUM 40 MG PO CPDR: 40.0000 mg | DELAYED_RELEASE_CAPSULE | Freq: Every day | ORAL | 3 refills | Status: DC

## 2023-05-23 NOTE — Progress Notes (Signed)
Subjective:    Patient ID: Lori Guerrero, female    DOB: January 30, 1951, 72 y.o.   MRN: [id number]  Patient here for a scheduled follow up.   HPI Here to follow up regarding hypercholesterolemia, GERD and hyperglycemia. Saw ortho 10/2022 - left knee pain.  S/p injection. Saw pulmonary 11/2022 - evaluation - wheezing. Recommended albuterol and PFTs. PFTs - normal. Had f/u 01/08/23.  Wheezing resolved. Breathing stable.  No increased cough or congestion.  Recommended to continue nexium and pepcid.  Saline nasal spray prn, claritin prn and albuterol prn. Saw podiatry 04/19/23 - bilateral foot pain.  Recommended topical votaren gel or biofreeze and tylenol.    Past Medical History:  Diagnosis Date   GERD (gastroesophageal reflux disease)    Hyperglycemia    Hyperplastic colon polyp    Skin cancer    Vitamin D deficiency    Past Surgical History:  Procedure Laterality Date   BREAST BIOPSY     25 years ago   MOHS SURGERY     skin cancer   Family History  Problem Relation Age of Onset   Heart disease Mother        irregular heart beat   Other Father        [place] Disease (CJD)   Biliary Cirrhosis Sister    Breast cancer Maternal Aunt    Breast cancer Paternal Aunt 70   Diabetes Maternal Grandmother    Cancer Paternal Grandfather        colon   Social History   Socioeconomic History   Marital status: Widowed    Spouse name: Not on file   Number of children: 1   Years of education: Not on file   Highest education level: Some college, no degree  Occupational History   Occupation: retired  Tobacco Use   Smoking status: Never    Passive exposure: Past (Husband did smoke.)   Smokeless tobacco: Never  Vaping Use   Vaping status: Never Used  Substance and Sexual Activity   Alcohol use: Yes    Alcohol/week: 4.0 standard drinks of alcohol    Types: 4 Cans of beer per week    Comment: 1-2 beers once twice a week   Drug use: No   Sexual activity: Not Currently     Comment: 1st intercourse- 19, partners- 2, widow   Other Topics Concern   Not on file  Social History Narrative   Not on file   Social Determinants of Health   Financial Resource Strain: Low Risk  (05/19/2023)   Overall Financial Resource Strain (CARDIA)    Difficulty of Paying Living Expenses: Not hard at all  Food Insecurity: No Food Insecurity (05/19/2023)   Hunger Vital Sign    Worried About Running Out of Food in the Last Year: Never true    Ran Out of Food in the Last Year: Never true  Transportation Needs: No Transportation Needs (05/19/2023)   PRAPARE - Administrator, Civil Service (Medical): No    Lack of Transportation (Non-Medical): No  Physical Activity: Inactive (05/19/2023)   Exercise Vital Sign    Days of Exercise per Week: 0 days    Minutes of Exercise per Session: 40 min  Stress: Stress Concern Present (05/19/2023)   [employer] of Occupational Health - Occupational Stress Questionnaire    Feeling of Stress : To some extent  Social Connections: Moderately Integrated (05/19/2023)   Social Connection and Isolation Panel [NHANES]    Frequency of Communication  with Friends and Family: More than three times a week    Frequency of Social Gatherings with Friends and Family: More than three times a week    Attends Religious Services: More than 4 times per year    Active Member of [employer] or Organizations: Yes    Attends Banker Meetings: 1 to 4 times per year    Marital Status: Widowed  Recent Concern: Social Connections - Moderately Isolated (05/03/2023)   Social Connection and Isolation Panel [NHANES]    Frequency of Communication with Friends and Family: More than three times a week    Frequency of Social Gatherings with Friends and Family: More than three times a week    Attends Religious Services: More than 4 times per year    Active Member of [employer] or Organizations: No    Attends Banker Meetings: Never    Marital  Status: Widowed     Review of Systems  Constitutional:  Negative for appetite change and unexpected weight change.  HENT:  Negative for congestion and sinus pressure.   Respiratory:  Negative for cough, chest tightness and shortness of breath.   Cardiovascular:  Negative for chest pain and palpitations.  Gastrointestinal:  Negative for abdominal pain, diarrhea, nausea and vomiting.  Genitourinary:  Negative for difficulty urinating and dysuria.  Musculoskeletal:  Negative for joint swelling and myalgias.  Skin:  Negative for color change and rash.  Neurological:  Negative for dizziness and headaches.  Psychiatric/Behavioral:  Negative for agitation and dysphoric mood.        Objective:     BP 122/70   Pulse 66   Temp 97.9 F (36.6 C)   Resp 16   Ht 5\' 4"  (1.626 m)   Wt 180 lb (81.6 kg)   SpO2 98%   BMI 30.90 kg/m  Wt Readings from Last 3 Encounters:  05/23/23 180 lb (81.6 kg)  05/03/23 177 lb (80.3 kg)  01/17/23 181 lb (82.1 kg)    Physical Exam Vitals reviewed.  Constitutional:      General: She is not in acute distress.    Appearance: Normal appearance.  HENT:     Head: Normocephalic and atraumatic.     Right Ear: External ear normal.     Left Ear: External ear normal.     Mouth/Throat:     Pharynx: No oropharyngeal exudate or posterior oropharyngeal erythema.  Eyes:     General: No scleral icterus.       Right eye: No discharge.        Left eye: No discharge.     Conjunctiva/sclera: Conjunctivae normal.  Neck:     Thyroid: No thyromegaly.  Cardiovascular:     Rate and Rhythm: Normal rate and regular rhythm.  Pulmonary:     Effort: No respiratory distress.     Breath sounds: Normal breath sounds. No wheezing.  Abdominal:     General: Bowel sounds are normal.     Palpations: Abdomen is soft.     Tenderness: There is no abdominal tenderness.  Musculoskeletal:        General: No swelling or tenderness.     Cervical back: Neck supple. No tenderness.   Lymphadenopathy:     Cervical: No cervical adenopathy.  Skin:    Findings: No erythema or rash.  Neurological:     Mental Status: She is alert.  Psychiatric:        Mood and Affect: Mood normal.        Behavior:  Behavior normal.      Outpatient Encounter Medications as of 05/23/2023  Medication Sig   esomeprazole (NEXIUM) 40 MG capsule Take 1 capsule (40 mg total) by mouth daily.   famotidine (PEPCID) 20 MG tablet Take 1 tablet (20 mg total) by mouth daily before supper.   Multiple Vitamins-Minerals (MULTIVITAMIN PO) Take by mouth daily.   [DISCONTINUED] albuterol (VENTOLIN HFA) 108 (90 Base) MCG/ACT inhaler Inhale 2 puffs into the lungs every 6 (six) hours as needed. (Patient not taking: Reported on 05/03/2023)   [DISCONTINUED] esomeprazole (NEXIUM) 40 MG capsule Take 1 capsule (40 mg total) by mouth daily.   [DISCONTINUED] fluorouracil (EFUDEX) 5 % cream Apply 1 Application topically 2 (two) times daily. (Patient not taking: Reported on 05/03/2023)   No facility-administered encounter medications on file as of 05/23/2023.     Lab Results  Component Value Date   WBC 4.2 05/22/2023   HGB 13.5 05/22/2023   HCT 40.9 05/22/2023   PLT 242.0 05/22/2023   GLUCOSE 119 (H) 05/18/2023   CHOL 185 05/18/2023   TRIG 75.0 05/18/2023   HDL 50.60 05/18/2023   LDLCALC 120 (H) 05/18/2023   ALT 19 05/18/2023   AST 22 05/18/2023   NA 142 05/18/2023   K 4.1 05/18/2023   CL 109 05/18/2023   CREATININE 0.89 05/18/2023   BUN 20 05/18/2023   CO2 25 05/18/2023   TSH 2.06 01/15/2023   HGBA1C 6.1 05/18/2023   MICROALBUR <0.7 02/07/2018    MR BRAIN/IAC W WO CONTRAST  Result Date: 12/14/2022 CLINICAL DATA:  Provided history: Anosmia. EXAM: MRI HEAD WITHOUT AND WITH CONTRAST TECHNIQUE: Multiplanar, multiecho pulse sequences of the brain and surrounding structures were obtained without and with intravenous contrast. CONTRAST:  8.5 mL Vueway intravenous contrast. COMPARISON:  None. FINDINGS:  Brain: No age advanced or lobar predominant parenchymal atrophy. No cortical encephalomalacia is identified. No significant cerebral white matter disease. There is no acute infarct. No evidence of an intracranial mass. No extra-axial fluid collection. No midline shift. No pathologic intracranial enhancement identified. Vascular: Maintained flow voids within the proximal large arterial vessels. Skull and upper cervical spine: No focal suspicious marrow lesion. Incompletely assessed cervical spondylosis. Sinuses/Orbits: No mass or acute finding within the imaged orbits. No significant paranasal sinus disease. IMPRESSION: Unremarkable MRI appearance of the brain. No evidence of an acute intracranial abnormality. No cause of anosmia is identified. Electronically Signed   By: Jackey Loge D.O.   On: 12/14/2022 18:07       Assessment & Plan:  Hot flashes -     TSH; Future  Wheezing Assessment & Plan: Saw pulmonary 11/2022 - evaluation - wheezing. Recommended albuterol and PFTs. PFTs - normal. Had f/u 01/08/23.  Wheezing resolved. Breathing stable.  No increased cough or congestion.  Recommended to continue nexium and pepcid.  Saline nasal spray prn, claritin prn and albuterol prn. No wheezing now.  Breathing stable.    Stress Assessment & Plan: Increased stress with her mother's health issues.  Discussed.  Will notify if feels needs further intervention.    Hyperglycemia Assessment & Plan: Low carb diet and exercise.  Follow met b and a1c.    Hypercholesterolemia Assessment & Plan: The 10-year ASCVD risk score (Arnett DK, et al., 2019) is: 9.6%   Values used to calculate the score:     Age: 22 years     Sex: Female     Is Non-Hispanic African American: No     Diabetic: No     Tobacco smoker:  No     Systolic Blood Pressure: 122 mmHg     Is BP treated: No     HDL Cholesterol: 50.6 mg/dL     Total Cholesterol: 185 mg/dL  Have discussed labs.  Discussed calculated cholesterol risk and  recommendation to start cholesterol medication.  Low cholesterol diet and exercise.  Follow lipid panel.    History of colonic polyps Assessment & Plan: Colonoscopy 10/20/19 - two diminutive polyps in the rectum. Non bleeding internal hemorrhoids. hyperplastic polyp.  Recommended f/u colonoscopy in 5 years.    Gastroesophageal reflux disease, unspecified whether esophagitis present Assessment & Plan: She is taking nexium.  Has pepcid if needed.  Has reported acid reflux is controlled if she takes nexium regularly and watches what she eats.    Foot pain, right Assessment & Plan:  Saw podiatry 04/19/23 - bilateral foot pain.  Recommended topical votaren gel or biofreeze and tylenol. Wants to hold on f/u at this time.    Multinodular goiter Assessment & Plan: Saw ENT (Dr Andee Poles). Thyroid ultrasound (10/2022)-  Similar findings of multinodular goiter. No definitive worrisome new or enlarging thyroid nodules. None of the discretely measured thyroid nodules/pseudo nodules, many of which appear similar to the 2009 examination, meet imaging criteria to recommend percutaneous sampling or continued dedicated follow-up.   Other orders -     Esomeprazole Magnesium; Take 1 capsule (40 mg total) by mouth daily.  Dispense: 90 capsule; Refill: 3     Dale Nezperce, MD

## 2023-05-27 ENCOUNTER — Encounter: Payer: Self-pay | Admitting: Internal Medicine

## 2023-05-27 DIAGNOSIS — E042 Nontoxic multinodular goiter: Secondary | ICD-10-CM | POA: Insufficient documentation

## 2023-05-27 NOTE — Assessment & Plan Note (Signed)
Increased stress with her mother's health issues.  Discussed.  Will notify if feels needs further intervention.

## 2023-05-27 NOTE — Assessment & Plan Note (Signed)
The 10-year ASCVD risk score (Arnett DK, et al., 2019) is: 9.6%   Values used to calculate the score:     Age: 72 years     Sex: Female     Is Non-Hispanic African American: No     Diabetic: No     Tobacco smoker: No     Systolic Blood Pressure: 122 mmHg     Is BP treated: No     HDL Cholesterol: 50.6 mg/dL     Total Cholesterol: 185 mg/dL  Have discussed labs.  Discussed calculated cholesterol risk and recommendation to start cholesterol medication.  Low cholesterol diet and exercise.  Follow lipid panel.

## 2023-05-27 NOTE — Assessment & Plan Note (Signed)
Low carb diet and exercise.  Follow met b and a1c.   

## 2023-05-27 NOTE — Assessment & Plan Note (Signed)
Saw podiatry 04/19/23 - bilateral foot pain.  Recommended topical votaren gel or biofreeze and tylenol. Wants to hold on f/u at this time.

## 2023-05-27 NOTE — Assessment & Plan Note (Addendum)
Saw pulmonary 11/2022 - evaluation - wheezing. Recommended albuterol and PFTs. PFTs - normal. Had f/u 01/08/23.  Wheezing resolved. Breathing stable.  No increased cough or congestion.  Recommended to continue nexium and pepcid.  Saline nasal spray prn, claritin prn and albuterol prn. No wheezing now.  Breathing stable.

## 2023-05-27 NOTE — Assessment & Plan Note (Signed)
She is taking nexium.  Has pepcid if needed.  Has reported acid reflux is controlled if she takes nexium regularly and watches what she eats.

## 2023-05-27 NOTE — Assessment & Plan Note (Signed)
Colonoscopy 10/20/19 - two diminutive polyps in the rectum. Non bleeding internal hemorrhoids. hyperplastic polyp.  Recommended f/u colonoscopy in 5 years.

## 2023-05-27 NOTE — Assessment & Plan Note (Signed)
Saw ENT (Dr Andee Poles). Thyroid ultrasound (10/2022)-  Similar findings of multinodular goiter. No definitive worrisome new or enlarging thyroid nodules. None of the discretely measured thyroid nodules/pseudo nodules, many of which appear similar to the 2009 examination, meet imaging criteria to recommend percutaneous sampling or continued dedicated follow-up.

## 2023-06-11 ENCOUNTER — Other Ambulatory Visit (INDEPENDENT_AMBULATORY_CARE_PROVIDER_SITE_OTHER): Payer: Medicare PPO

## 2023-06-11 DIAGNOSIS — R232 Flushing: Secondary | ICD-10-CM

## 2023-06-11 LAB — TSH: TSH: 2.58 u[IU]/mL (ref 0.35–5.50)

## 2023-06-13 ENCOUNTER — Other Ambulatory Visit: Payer: Medicare PPO

## 2023-07-03 DIAGNOSIS — M19071 Primary osteoarthritis, right ankle and foot: Secondary | ICD-10-CM | POA: Diagnosis not present

## 2023-07-03 DIAGNOSIS — M79671 Pain in right foot: Secondary | ICD-10-CM | POA: Diagnosis not present

## 2023-09-17 ENCOUNTER — Encounter: Payer: Self-pay | Admitting: Internal Medicine

## 2023-09-17 MED ORDER — ESOMEPRAZOLE MAGNESIUM 40 MG PO CPDR: 40.0000 mg | DELAYED_RELEASE_CAPSULE | Freq: Every day | ORAL | 3 refills | Status: DC

## 2023-09-21 ENCOUNTER — Telehealth: Payer: Self-pay | Admitting: Internal Medicine

## 2023-09-21 DIAGNOSIS — E7800 Pure hypercholesterolemia, unspecified: Secondary | ICD-10-CM

## 2023-09-21 DIAGNOSIS — R739 Hyperglycemia, unspecified: Secondary | ICD-10-CM

## 2023-09-21 NOTE — Telephone Encounter (Signed)
 Patient need lab orders.

## 2023-09-21 NOTE — Telephone Encounter (Signed)
 Orders placed.

## 2023-09-24 ENCOUNTER — Other Ambulatory Visit (INDEPENDENT_AMBULATORY_CARE_PROVIDER_SITE_OTHER): Payer: Medicare PPO

## 2023-09-24 ENCOUNTER — Ambulatory Visit (INDEPENDENT_AMBULATORY_CARE_PROVIDER_SITE_OTHER)

## 2023-09-24 ENCOUNTER — Telehealth: Payer: Self-pay

## 2023-09-24 ENCOUNTER — Encounter: Payer: Self-pay | Admitting: Internal Medicine

## 2023-09-24 DIAGNOSIS — E7800 Pure hypercholesterolemia, unspecified: Secondary | ICD-10-CM | POA: Diagnosis not present

## 2023-09-24 DIAGNOSIS — R739 Hyperglycemia, unspecified: Secondary | ICD-10-CM | POA: Diagnosis not present

## 2023-09-24 DIAGNOSIS — E042 Nontoxic multinodular goiter: Secondary | ICD-10-CM

## 2023-09-24 LAB — HEPATIC FUNCTION PANEL
ALT: 17 U/L (ref 0–35)
AST: 18 U/L (ref 0–37)
Albumin: 3.9 g/dL (ref 3.5–5.2)
Alkaline Phosphatase: 99 U/L (ref 39–117)
Bilirubin, Direct: 0 mg/dL (ref 0.0–0.3)
Total Bilirubin: 0.4 mg/dL (ref 0.2–1.2)
Total Protein: 6.4 g/dL (ref 6.0–8.3)

## 2023-09-24 LAB — HEMOGLOBIN A1C: Hgb A1c MFr Bld: 6 % (ref 4.6–6.5)

## 2023-09-24 LAB — BASIC METABOLIC PANEL
BUN: 17 mg/dL (ref 6–23)
CO2: 23 meq/L (ref 19–32)
Calcium: 9 mg/dL (ref 8.4–10.5)
Chloride: 108 meq/L (ref 96–112)
Creatinine, Ser: 0.84 mg/dL (ref 0.40–1.20)
GFR: 69.83 mL/min (ref >60.00)
Glucose, Bld: 117 mg/dL — ABNORMAL HIGH (ref 70–99)
Potassium: 4.1 meq/L (ref 3.5–5.1)
Sodium: 141 meq/L (ref 135–145)

## 2023-09-24 LAB — CBC WITH DIFFERENTIAL/PLATELET
Basophils Absolute: 0 10*3/uL (ref 0.0–0.1)
Basophils Relative: 0.9 % (ref 0.0–3.0)
Eosinophils Absolute: 0.2 10*3/uL (ref 0.0–0.7)
Eosinophils Relative: 3.5 % (ref 0.0–5.0)
HCT: 40.5 % (ref 36.0–46.0)
Hemoglobin: 14 g/dL (ref 12.0–15.0)
Lymphocytes Relative: 30.3 % (ref 12.0–46.0)
Lymphs Abs: 1.4 10*3/uL (ref 0.7–4.0)
MCHC: 34.5 g/dL (ref 30.0–36.0)
MCV: 93.5 fl (ref 78.0–100.0)
Monocytes Absolute: 0.3 10*3/uL (ref 0.1–1.0)
Monocytes Relative: 5.9 % (ref 3.0–12.0)
Neutro Abs: 2.7 10*3/uL (ref 1.4–7.7)
Neutrophils Relative %: 59.4 % (ref 43.0–77.0)
Platelets: 232 10*3/uL (ref 150.0–400.0)
RBC: 4.34 Mil/uL (ref 3.87–5.11)
RDW: 13 % (ref 11.5–15.5)
WBC: 4.6 10*3/uL (ref 4.0–10.5)

## 2023-09-24 LAB — LIPID PANEL
Cholesterol: 181 mg/dL (ref 0–200)
HDL: 56.9 mg/dL (ref >39.00)
LDL Cholesterol: 107 mg/dL — ABNORMAL HIGH (ref 0–99)
NonHDL: 124.44
Total CHOL/HDL Ratio: 3
Triglycerides: 87 mg/dL (ref 0.0–149.0)
VLDL: 17.4 mg/dL (ref 0.0–40.0)

## 2023-09-24 LAB — TSH: TSH: 2.71 u[IU]/mL (ref 0.35–5.50)

## 2023-09-24 NOTE — Telephone Encounter (Signed)
 I have placed the order for the add on tsh. Please notify lab. Also, please call and confirm what she is talking about - test to check acid in stomach.

## 2023-09-24 NOTE — Telephone Encounter (Signed)
 Please add TSH to recent labs per Dr Lorin Picket. Thanks.

## 2023-09-24 NOTE — Telephone Encounter (Signed)
 I have sent message for TSH to be added. She thought there was a test to check the acid in her stomach. Advised that I was not aware of this. Patient says nevermind. She has appt on Wednesday.

## 2023-09-26 ENCOUNTER — Encounter: Payer: Self-pay | Admitting: Internal Medicine

## 2023-09-26 ENCOUNTER — Ambulatory Visit (INDEPENDENT_AMBULATORY_CARE_PROVIDER_SITE_OTHER): Payer: Medicare PPO | Admitting: Internal Medicine

## 2023-09-26 VITALS — BP 126/74 | HR 67 | Temp 97.8°F | Ht 64.0 in | Wt 182.2 lb

## 2023-09-26 DIAGNOSIS — E042 Nontoxic multinodular goiter: Secondary | ICD-10-CM

## 2023-09-26 DIAGNOSIS — M25551 Pain in right hip: Secondary | ICD-10-CM | POA: Diagnosis not present

## 2023-09-26 DIAGNOSIS — F439 Reaction to severe stress, unspecified: Secondary | ICD-10-CM

## 2023-09-26 DIAGNOSIS — Z0000 Encounter for general adult medical examination without abnormal findings: Secondary | ICD-10-CM

## 2023-09-26 DIAGNOSIS — K219 Gastro-esophageal reflux disease without esophagitis: Secondary | ICD-10-CM | POA: Diagnosis not present

## 2023-09-26 DIAGNOSIS — R739 Hyperglycemia, unspecified: Secondary | ICD-10-CM | POA: Diagnosis not present

## 2023-09-26 DIAGNOSIS — E7800 Pure hypercholesterolemia, unspecified: Secondary | ICD-10-CM

## 2023-09-26 DIAGNOSIS — M79671 Pain in right foot: Secondary | ICD-10-CM

## 2023-09-26 DIAGNOSIS — M25562 Pain in left knee: Secondary | ICD-10-CM | POA: Diagnosis not present

## 2023-09-26 DIAGNOSIS — Z860100 Personal history of colon polyps, unspecified: Secondary | ICD-10-CM

## 2023-09-26 MED ORDER — ESOMEPRAZOLE MAGNESIUM 40 MG PO CPDR: 40.0000 mg | DELAYED_RELEASE_CAPSULE | Freq: Every day | ORAL | 1 refills | Status: DC

## 2023-09-26 NOTE — Assessment & Plan Note (Signed)
 Physical today 09/26/23.  Mammogram 07/13/22 - Birads I.  Colonoscopy - 07/2016 - recommended f/u in 3 years.  F/u colonoscopy 10/2019 - recommended f/u in 5 years.

## 2023-09-26 NOTE — Assessment & Plan Note (Signed)
 The 10-year ASCVD risk score (Arnett DK, et al., 2019) is: 9.9%   Values used to calculate the score:     Age: 73 years     Sex: Female     Is Non-Hispanic African American: No     Diabetic: No     Tobacco smoker: No     Systolic Blood Pressure: 126 mmHg     Is BP treated: No     HDL Cholesterol: 56.9 mg/dL     Total Cholesterol: 181 mg/dL  Discussed low cholesterol diet and exercise. Declines cholesterol medication. Follow lipid panel.

## 2023-09-26 NOTE — Patient Instructions (Signed)
 Pepcid (famotidine) - 20mg  - take one tablet before evening meal.

## 2023-09-26 NOTE — Progress Notes (Unsigned)
 Subjective:    Patient ID: Lori Guerrero, female    DOB: October 22, 1950, 73 y.o.   MRN: [id number]  Patient here for No chief complaint on file.   HPI Here for a physical exam.    Past Medical History:  Diagnosis Date  . GERD (gastroesophageal reflux disease)   . Hyperglycemia   . Hyperplastic colon polyp   . Skin cancer   . Vitamin D deficiency    Past Surgical History:  Procedure Laterality Date  . BREAST BIOPSY     25 years ago  . MOHS SURGERY     skin cancer   Family History  Problem Relation Age of Onset  . Heart disease Mother        irregular heart beat  . Other Father        Lenise Arena Disease (CJD)  . Biliary Cirrhosis Sister   . Breast cancer Maternal Aunt   . Breast cancer Paternal Aunt 46  . Diabetes Maternal Grandmother   . Cancer Paternal Grandfather        colon   Social History   Socioeconomic History  . Marital status: Widowed    Spouse name: Not on file  . Number of children: 1  . Years of education: Not on file  . Highest education level: Some college, no degree  Occupational History  . Occupation: retired  Tobacco Use  . Smoking status: Never    Passive exposure: Past (Husband did smoke.)  . Smokeless tobacco: Never  Vaping Use  . Vaping status: Never Used  Substance and Sexual Activity  . Alcohol use: Yes    Alcohol/week: 4.0 standard drinks of alcohol    Types: 4 Cans of beer per week    Comment: 1-2 beers once twice a week  . Drug use: No  . Sexual activity: Not Currently    Comment: 1st intercourse- 19, partners- 2, widow   Other Topics Concern  . Not on file  Social History Narrative  . Not on file   Social Drivers of Health   Financial Resource Strain: Low Risk  (05/19/2023)   Overall Financial Resource Strain (CARDIA)   . Difficulty of Paying Living Expenses: Not hard at all  Food Insecurity: No Food Insecurity (05/19/2023)   Hunger Vital Sign   . Worried About Programme researcher, broadcasting/film/video in the Last Year: Never true    . Ran Out of Food in the Last Year: Never true  Transportation Needs: No Transportation Needs (05/19/2023)   PRAPARE - Transportation   . Lack of Transportation (Medical): No   . Lack of Transportation (Non-Medical): No  Physical Activity: Inactive (05/19/2023)   Exercise Vital Sign   . Days of Exercise per Week: 0 days   . Minutes of Exercise per Session: 40 min  Stress: Stress Concern Present (05/19/2023)   [employer] of Occupational Health - Occupational Stress Questionnaire   . Feeling of Stress : To some extent  Social Connections: Moderately Integrated (05/19/2023)   Social Connection and Isolation Panel [NHANES]   . Frequency of Communication with Friends and Family: More than three times a week   . Frequency of Social Gatherings with Friends and Family: More than three times a week   . Attends Religious Services: More than 4 times per year   . Active Member of Clubs or Organizations: Yes   . Attends Banker Meetings: 1 to 4 times per year   . Marital Status: Widowed  Recent Concern: Social Connections -  Moderately Isolated (05/03/2023)   Social Connection and Isolation Panel [NHANES]   . Frequency of Communication with Friends and Family: More than three times a week   . Frequency of Social Gatherings with Friends and Family: More than three times a week   . Attends Religious Services: More than 4 times per year   . Active Member of Clubs or Organizations: No   . Attends Banker Meetings: Never   . Marital Status: Widowed     Review of Systems     Objective:     BP 126/74   Pulse 67   Temp 97.8 F (36.6 C) (Oral)   Ht 5\' 4"  (1.626 m)   Wt 182 lb 3.2 oz (82.6 kg)   SpO2 96%   BMI 31.27 kg/m  Wt Readings from Last 3 Encounters:  09/26/23 182 lb 3.2 oz (82.6 kg)  05/23/23 180 lb (81.6 kg)  05/03/23 177 lb (80.3 kg)    Physical Exam  {Perform Simple Foot Exam  Perform Detailed exam:1} {Insert foot Exam (Optional):30965}    Outpatient Encounter Medications as of 09/26/2023  Medication Sig  . esomeprazole (NEXIUM) 40 MG capsule Take 1 capsule (40 mg total) by mouth daily.  . famotidine (PEPCID) 20 MG tablet Take 1 tablet (20 mg total) by mouth daily before supper.  . Multiple Vitamins-Minerals (MULTIVITAMIN PO) Take by mouth daily.   No facility-administered encounter medications on file as of 09/26/2023.     Lab Results  Component Value Date   WBC 4.6 09/24/2023   HGB 14.0 09/24/2023   HCT 40.5 09/24/2023   PLT 232.0 09/24/2023   GLUCOSE 117 (H) 09/24/2023   CHOL 181 09/24/2023   TRIG 87.0 09/24/2023   HDL 56.90 09/24/2023   LDLCALC 107 (H) 09/24/2023   ALT 17 09/24/2023   AST 18 09/24/2023   NA 141 09/24/2023   K 4.1 09/24/2023   CL 108 09/24/2023   CREATININE 0.84 09/24/2023   BUN 17 09/24/2023   CO2 23 09/24/2023   TSH 2.71 09/24/2023   HGBA1C 6.0 09/24/2023   MICROALBUR <0.7 02/07/2018    MR BRAIN/IAC W WO CONTRAST Result Date: 12/14/2022 CLINICAL DATA:  Provided history: Anosmia. EXAM: MRI HEAD WITHOUT AND WITH CONTRAST TECHNIQUE: Multiplanar, multiecho pulse sequences of the brain and surrounding structures were obtained without and with intravenous contrast. CONTRAST:  8.5 mL Vueway intravenous contrast. COMPARISON:  None. FINDINGS: Brain: No age advanced or lobar predominant parenchymal atrophy. No cortical encephalomalacia is identified. No significant cerebral white matter disease. There is no acute infarct. No evidence of an intracranial mass. No extra-axial fluid collection. No midline shift. No pathologic intracranial enhancement identified. Vascular: Maintained flow voids within the proximal large arterial vessels. Skull and upper cervical spine: No focal suspicious marrow lesion. Incompletely assessed cervical spondylosis. Sinuses/Orbits: No mass or acute finding within the imaged orbits. No significant paranasal sinus disease. IMPRESSION: Unremarkable MRI appearance of the brain. No  evidence of an acute intracranial abnormality. No cause of anosmia is identified. Electronically Signed   By: Jackey Loge D.O.   On: 12/14/2022 18:07       Assessment & Plan:  Health care maintenance Assessment & Plan: Physical today 09/26/23.  Mammogram 07/13/22 - Birads I.  Colonoscopy - 07/2016 - recommended f/u in 3 years.  F/u colonoscopy 10/2019 - recommended f/u in 5 years.    Hypercholesterolemia  Hyperglycemia  Multinodular goiter     Dale Haddam, MD

## 2023-09-27 ENCOUNTER — Encounter: Payer: Self-pay | Admitting: Internal Medicine

## 2023-09-27 NOTE — Assessment & Plan Note (Signed)
 Low carb diet and exercise.  Follow met b and a1c.  Lab Results  Component Value Date   HGBA1C 6.0 09/24/2023

## 2023-09-27 NOTE — Assessment & Plan Note (Signed)
 Colonoscopy 10/20/19 - two diminutive polyps in the rectum. Non bleeding internal hemorrhoids. hyperplastic polyp.  Recommended f/u colonoscopy in 5 years.

## 2023-09-27 NOTE — Assessment & Plan Note (Signed)
 Reported history of remote left knee injury.  Will notice some intermittent pain and sensation change.  Discussed PT.  Given persistence, request referral to ortho for further evaluation.

## 2023-09-27 NOTE — Assessment & Plan Note (Signed)
 Increased stress with her mother's health issues. Discussed. Notify me if feels needs further intervention. Follow.

## 2023-09-27 NOTE — Assessment & Plan Note (Signed)
 Saw ENT (Dr Andee Poles). Thyroid ultrasound (10/2022)-  Similar findings of multinodular goiter. No definitive worrisome new or enlarging thyroid nodules. None of the discretely measured thyroid nodules/pseudo nodules, many of which appear similar to the 2009 examination, meet imaging criteria to recommend percutaneous sampling or continued dedicated follow-up. Recent tsh wnl.

## 2023-09-27 NOTE — Assessment & Plan Note (Signed)
 Has seen podiatry 04/19/23 - bilateral foot pain.  Recommended topical votaren gel or biofreeze and tylenol. S/p injury a couple of months ago. Right lateral foot pain as outlined. Discussed further evaluation. Still having pain both feet with walking. Discussed wearing good support shoes and not going barefoot. Discussed the need for f/u with podiatry. She will schedule or let me know who she wants to see and we will schedule.

## 2023-09-27 NOTE — Assessment & Plan Note (Signed)
 Back on nexium as outlined. Had increased symptoms after being off PPI x 1 week. Discussed the need for f/u with GI and possible EGD given immediate return and increased symptoms after off PPI for short time. She declines further evaluation at this time. Of note abdominal ultrasound 10/2022 - ok. Will have her take nexium 40mg  before breakfast and start pepcid 30 minutes before evening meal. Follow. Call with update. Schedule soon f/u to reassess.

## 2023-09-27 NOTE — Assessment & Plan Note (Signed)
 Right lateral hip pain as outlined. Appears to be reproducible with palpation. Discussed trochanteric bursitis. Given persistence, refer her to ortho for further evaluation and treatment. Previous xray - ok.

## 2023-11-01 DIAGNOSIS — M7061 Trochanteric bursitis, right hip: Secondary | ICD-10-CM | POA: Diagnosis not present

## 2023-11-01 DIAGNOSIS — G8929 Other chronic pain: Secondary | ICD-10-CM | POA: Diagnosis not present

## 2023-11-01 DIAGNOSIS — M1712 Unilateral primary osteoarthritis, left knee: Secondary | ICD-10-CM | POA: Diagnosis not present

## 2023-11-01 DIAGNOSIS — M25551 Pain in right hip: Secondary | ICD-10-CM | POA: Diagnosis not present

## 2023-11-07 ENCOUNTER — Encounter (HOSPITAL_COMMUNITY): Payer: Self-pay

## 2023-11-14 ENCOUNTER — Ambulatory Visit: Admitting: Internal Medicine

## 2023-12-03 ENCOUNTER — Telehealth: Payer: Self-pay

## 2023-12-03 ENCOUNTER — Ambulatory Visit: Admitting: Internal Medicine

## 2023-12-03 ENCOUNTER — Encounter: Payer: Self-pay | Admitting: Internal Medicine

## 2023-12-03 VITALS — BP 128/72 | HR 70 | Temp 98.0°F | Resp 16 | Ht 64.0 in | Wt 182.8 lb

## 2023-12-03 DIAGNOSIS — R1013 Epigastric pain: Secondary | ICD-10-CM

## 2023-12-03 DIAGNOSIS — Z860100 Personal history of colon polyps, unspecified: Secondary | ICD-10-CM

## 2023-12-03 DIAGNOSIS — E7800 Pure hypercholesterolemia, unspecified: Secondary | ICD-10-CM | POA: Diagnosis not present

## 2023-12-03 DIAGNOSIS — K219 Gastro-esophageal reflux disease without esophagitis: Secondary | ICD-10-CM | POA: Diagnosis not present

## 2023-12-03 DIAGNOSIS — R739 Hyperglycemia, unspecified: Secondary | ICD-10-CM

## 2023-12-03 DIAGNOSIS — G8929 Other chronic pain: Secondary | ICD-10-CM | POA: Diagnosis not present

## 2023-12-03 DIAGNOSIS — E042 Nontoxic multinodular goiter: Secondary | ICD-10-CM | POA: Diagnosis not present

## 2023-12-03 DIAGNOSIS — M25551 Pain in right hip: Secondary | ICD-10-CM

## 2023-12-03 DIAGNOSIS — F439 Reaction to severe stress, unspecified: Secondary | ICD-10-CM

## 2023-12-03 DIAGNOSIS — Z1231 Encounter for screening mammogram for malignant neoplasm of breast: Secondary | ICD-10-CM

## 2023-12-03 DIAGNOSIS — M25561 Pain in right knee: Secondary | ICD-10-CM | POA: Diagnosis not present

## 2023-12-03 LAB — HEPATIC FUNCTION PANEL
ALT: 17 U/L (ref 0–35)
AST: 21 U/L (ref 0–37)
Albumin: 4.1 g/dL (ref 3.5–5.2)
Alkaline Phosphatase: 103 U/L (ref 39–117)
Bilirubin, Direct: 0.1 mg/dL (ref 0.0–0.3)
Total Bilirubin: 0.4 mg/dL (ref 0.2–1.2)
Total Protein: 6.5 g/dL (ref 6.0–8.3)

## 2023-12-03 LAB — CBC WITH DIFFERENTIAL/PLATELET
Basophils Absolute: 0.1 10*3/uL (ref 0.0–0.1)
Basophils Relative: 1.1 % (ref 0.0–3.0)
Eosinophils Absolute: 0.1 10*3/uL (ref 0.0–0.7)
Eosinophils Relative: 3 % (ref 0.0–5.0)
HCT: 40.6 % (ref 36.0–46.0)
Hemoglobin: 13.7 g/dL (ref 12.0–15.0)
Lymphocytes Relative: 23.7 % (ref 12.0–46.0)
Lymphs Abs: 1.1 10*3/uL (ref 0.7–4.0)
MCHC: 33.8 g/dL (ref 30.0–36.0)
MCV: 91.5 fl (ref 78.0–100.0)
Monocytes Absolute: 0.3 10*3/uL (ref 0.1–1.0)
Monocytes Relative: 5.4 % (ref 3.0–12.0)
Neutro Abs: 3.2 10*3/uL (ref 1.4–7.7)
Neutrophils Relative %: 66.8 % (ref 43.0–77.0)
Platelets: 265 10*3/uL (ref 150.0–400.0)
RBC: 4.44 Mil/uL (ref 3.87–5.11)
RDW: 12.9 % (ref 11.5–15.5)
WBC: 4.8 10*3/uL (ref 4.0–10.5)

## 2023-12-03 LAB — BASIC METABOLIC PANEL WITH GFR
BUN: 19 mg/dL (ref 6–23)
CO2: 23 meq/L (ref 19–32)
Calcium: 9.4 mg/dL (ref 8.4–10.5)
Chloride: 106 meq/L (ref 96–112)
Creatinine, Ser: 0.83 mg/dL (ref 0.40–1.20)
GFR: 70.75 mL/min (ref >60.00)
Glucose, Bld: 116 mg/dL — ABNORMAL HIGH (ref 70–99)
Potassium: 4 meq/L (ref 3.5–5.1)
Sodium: 138 meq/L (ref 135–145)

## 2023-12-03 LAB — LIPASE: Lipase: 36 U/L (ref 11.0–59.0)

## 2023-12-03 MED ORDER — SERTRALINE HCL 25 MG PO TABS: 25.0000 mg | ORAL_TABLET | Freq: Every day | ORAL | 2 refills | Status: DC

## 2023-12-03 NOTE — Telephone Encounter (Signed)
 I can see her 01/23/24 - 11:00.

## 2023-12-03 NOTE — Progress Notes (Signed)
 Subjective:    Patient ID: Lori Guerrero, female    DOB: 1951-05-06, 73 y.o.   MRN: [id number]  Patient here for  Chief Complaint  Patient presents with   Medical Management of Chronic Issues    HPI Here for a scheduled follow up - follow up regarding increased stress, right hip pain, hypercholesterolemia, hyperglycemia and GERD. Saw Dr Hooten 11/01/23 - left knee pain and right hip discomfort. Diagnosed with  right greater trochanteric bursitis and left knee OA and distal iliotibial band syndrome vs plica. Recommended topical ice and heat as directed prior to stretching, topical voltaren gel, exercises and PT. ` Her first PT session is today.  She is still having knee issues.  Plans to discuss with them at her physical therapy appointment.  She reports increased stress.  Stress with her mother's health issues and also some issues with her sister.  This is stress she is dealing with every day.  Feels it is contributing to her GI symptoms.  Increased anxiety.  Discussed treatment.  She is agreeable.  No chest pain.  Breathing stable.  Regarding her bowel issues she is having intermittent diarrhea.  Taking Nexium  daily.  Forgets her Pepcid  in the evening.  Will have some acid reflux symptoms.   Past Medical History:  Diagnosis Date   GERD (gastroesophageal reflux disease)    Hyperglycemia    Hyperplastic colon polyp    Skin cancer    Vitamin D  deficiency    Past Surgical History:  Procedure Laterality Date   BREAST BIOPSY     25 years ago   MOHS SURGERY     skin cancer   Family History  Problem Relation Age of Onset   Heart disease Mother        irregular heart beat   Other Father        Lenore Rafter Disease (CJD)   Biliary Cirrhosis Sister    Breast cancer Maternal Aunt    Breast cancer Paternal Aunt 52   Diabetes Maternal Grandmother    Cancer Paternal Grandfather        colon   Social History   Socioeconomic History   Marital status: Widowed    Spouse name: Not on  file   Number of children: 1   Years of education: Not on file   Highest education level: Some college, no degree  Occupational History   Occupation: retired  Tobacco Use   Smoking status: Never    Passive exposure: Past (Husband did smoke.)   Smokeless tobacco: Never  Vaping Use   Vaping status: Never Used  Substance and Sexual Activity   Alcohol use: Yes    Alcohol/week: 4.0 standard drinks of alcohol    Types: 4 Cans of beer per week    Comment: 1-2 beers once twice a week   Drug use: No   Sexual activity: Not Currently    Comment: 1st intercourse- 19, partners- 2, widow   Other Topics Concern   Not on file  Social History Narrative   Not on file   Social Drivers of Health   Financial Resource Strain: Low Risk  (12/03/2023)   Received from [facility]   Overall Financial Resource Strain (CARDIA)    Difficulty of Paying Living Expenses: Not hard at all  Food Insecurity: No Food Insecurity (12/03/2023)   Received from [facility]   Hunger Vital Sign    Worried About Running Out of Food in the Last Year: Never  true    Ran Out of Food in the Last Year: Never true  Transportation Needs: No Transportation Needs (12/03/2023)   Received from [facility] - Transportation    In the past 12 months, has lack of transportation kept you from medical appointments or from getting medications?: No    Lack of Transportation (Non-Medical): No  Physical Activity: Inactive (05/19/2023)   Exercise Vital Sign    Days of Exercise per Week: 0 days    Minutes of Exercise per Session: 40 min  Stress: Stress Concern Present (05/19/2023)   [employer] of Occupational Health - Occupational Stress Questionnaire    Feeling of Stress : To some extent  Social Connections: Moderately Integrated (05/19/2023)   Social Connection and Isolation Panel [NHANES]    Frequency of Communication with Friends and Family: More than three times a  week    Frequency of Social Gatherings with Friends and Family: More than three times a week    Attends Religious Services: More than 4 times per year    Active Member of [employer] or Organizations: Yes    Attends Banker Meetings: 1 to 4 times per year    Marital Status: Widowed  Recent Concern: Social Connections - Moderately Isolated (05/03/2023)   Social Connection and Isolation Panel [NHANES]    Frequency of Communication with Friends and Family: More than three times a week    Frequency of Social Gatherings with Friends and Family: More than three times a week    Attends Religious Services: More than 4 times per year    Active Member of [employer] or Organizations: No    Attends Banker Meetings: Never    Marital Status: Widowed     Review of Systems  Constitutional:  Negative for appetite change and unexpected weight change.  HENT:  Negative for congestion and sinus pressure.   Respiratory:  Negative for cough, chest tightness and shortness of breath.   Cardiovascular:  Negative for chest pain, palpitations and leg swelling.  Gastrointestinal:  Negative for vomiting.       Bowel issues - she feels is related to stress as outlined.   Genitourinary:  Negative for difficulty urinating and dysuria.  Musculoskeletal:  Negative for myalgias.       Knee pain as outlined.   Skin:  Negative for color change and rash.  Neurological:  Negative for dizziness and headaches.  Psychiatric/Behavioral:         Increased stress as outlined.        Objective:     BP 128/72   Pulse 70   Temp 98 F (36.7 C)   Resp 16   Ht 5\' 4"  (1.626 m)   Wt 182 lb 12.8 oz (82.9 kg)   SpO2 98%   BMI 31.38 kg/m  Wt Readings from Last 3 Encounters:  12/03/23 182 lb 12.8 oz (82.9 kg)  09/26/23 182 lb 3.2 oz (82.6 kg)  05/23/23 180 lb (81.6 kg)    Physical Exam Vitals reviewed.  Constitutional:      General: She is not in acute distress.    Appearance: Normal appearance.   HENT:     Head: Normocephalic and atraumatic.     Right Ear: External ear normal.     Left Ear: External ear normal.     Mouth/Throat:     Pharynx: No oropharyngeal exudate or posterior oropharyngeal erythema.  Eyes:     General: No scleral icterus.  Right eye: No discharge.        Left eye: No discharge.     Conjunctiva/sclera: Conjunctivae normal.  Neck:     Thyroid : No thyromegaly.  Cardiovascular:     Rate and Rhythm: Normal rate and regular rhythm.  Pulmonary:     Effort: No respiratory distress.     Breath sounds: Normal breath sounds. No wheezing.  Abdominal:     General: Bowel sounds are normal.     Palpations: Abdomen is soft.     Tenderness: There is no abdominal tenderness.  Musculoskeletal:        General: No swelling or tenderness.     Cervical back: Neck supple. No tenderness.  Lymphadenopathy:     Cervical: No cervical adenopathy.  Skin:    Findings: No erythema or rash.  Neurological:     Mental Status: She is alert.  Psychiatric:        Mood and Affect: Mood normal.        Behavior: Behavior normal.         Outpatient Encounter Medications as of 12/03/2023  Medication Sig   sertraline  (ZOLOFT ) 25 MG tablet Take 1 tablet (25 mg total) by mouth daily.   esomeprazole  (NEXIUM ) 40 MG capsule Take 1 capsule (40 mg total) by mouth daily.   famotidine  (PEPCID ) 20 MG tablet Take 1 tablet (20 mg total) by mouth daily before supper.   Multiple Vitamins-Minerals (MULTIVITAMIN PO) Take by mouth daily.   No facility-administered encounter medications on file as of 12/03/2023.     Lab Results  Component Value Date   WBC 4.8 12/03/2023   HGB 13.7 12/03/2023   HCT 40.6 12/03/2023   PLT 265.0 12/03/2023   GLUCOSE 116 (H) 12/03/2023   CHOL 181 09/24/2023   TRIG 87.0 09/24/2023   HDL 56.90 09/24/2023   LDLCALC 107 (H) 09/24/2023   ALT 17 12/03/2023   AST 21 12/03/2023   NA 138 12/03/2023   K 4.0 12/03/2023   CL 106 12/03/2023   CREATININE 0.83  12/03/2023   BUN 19 12/03/2023   CO2 23 12/03/2023   TSH 2.71 09/24/2023   HGBA1C 6.0 09/24/2023   MICROALBUR <0.7 02/07/2018    MR BRAIN/IAC W WO CONTRAST Result Date: 12/14/2022 CLINICAL DATA:  Provided history: Anosmia. EXAM: MRI HEAD WITHOUT AND WITH CONTRAST TECHNIQUE: Multiplanar, multiecho pulse sequences of the brain and surrounding structures were obtained without and with intravenous contrast. CONTRAST:  8.5 mL Vueway  intravenous contrast. COMPARISON:  None. FINDINGS: Brain: No age advanced or lobar predominant parenchymal atrophy. No cortical encephalomalacia is identified. No significant cerebral white matter disease. There is no acute infarct. No evidence of an intracranial mass. No extra-axial fluid collection. No midline shift. No pathologic intracranial enhancement identified. Vascular: Maintained flow voids within the proximal large arterial vessels. Skull and upper cervical spine: No focal suspicious marrow lesion. Incompletely assessed cervical spondylosis. Sinuses/Orbits: No mass or acute finding within the imaged orbits. No significant paranasal sinus disease. IMPRESSION: Unremarkable MRI appearance of the brain. No evidence of an acute intracranial abnormality. No cause of anosmia is identified. Electronically Signed   By: Bascom Lily D.O.   On: 12/14/2022 18:07       Assessment & Plan:  Encounter for screening mammogram for malignant neoplasm of breast -     3D Screening Mammogram, Left and Right; Future  Epigastric pain Assessment & Plan: Problems with diarrhea - intermittent. Some discomfort. Taking nexium . Forgets pepcid  in the evening. Some acid reflux symptoms. Feels  stress is contributing to her symptoms. Check labs, including cbc, metabolic panel, liver panel and lipase. Take acid reflux medication scheduled. Treat stress as outlined. Follow.  Call with update.   Orders: -     CBC with Differential/Platelet -     Hepatic function panel -     Lipase -     Basic  metabolic panel with GFR  Gastroesophageal reflux disease, unspecified whether esophagitis present Assessment & Plan: Of note abdominal ultrasound 10/2022 - ok. Take nexium  40mg  before breakfast and start pepcid  30 minutes before evening meal - take scheduled. Have discussed f/u with GI - given persistent upper symptoms despite PPI.    Hip pain, right Assessment & Plan:  Saw Dr Aubry Blase 11/01/23 - left knee pain and right hip discomfort. Diagnosed with  right greater trochanteric bursitis and left knee OA and distal iliotibial band syndrome vs plica. Recommended topical ice and heat as directed prior to stretching, topical voltaren gel, exercises and PT. ` Her first PT session is today.  She is still having knee issues.  Plans to discuss with them at her physical therapy appointment.    History of colonic polyps Assessment & Plan: Colonoscopy 10/20/19 - two diminutive polyps in the rectum. Non bleeding internal hemorrhoids. hyperplastic polyp.  Recommended f/u colonoscopy in 5 years.    Hypercholesterolemia Assessment & Plan: The 10-year ASCVD risk score (Arnett DK, et al., 2019) is: 10.2%   Values used to calculate the score:     Age: 49 years     Sex: Female     Is Non-Hispanic African American: No     Diabetic: No     Tobacco smoker: No     Systolic Blood Pressure: 128 mmHg     Is BP treated: No     HDL Cholesterol: 56.9 mg/dL     Total Cholesterol: 181 mg/dL  Discussed low cholesterol diet and exercise. Has declined cholesterol medication. Follow lipid panel.    Hyperglycemia Assessment & Plan: Low carb diet and exercise. Follow met b and A1c.   Lab Results  Component Value Date   HGBA1C 6.0 09/24/2023      Multinodular goiter Assessment & Plan: Saw ENT (Dr Donnie Galea). Thyroid  ultrasound (10/2022)-  Similar findings of multinodular goiter. No definitive worrisome new or enlarging thyroid  nodules. None of the discretely measured thyroid  nodules/pseudo nodules, many of which  appear similar to the 2009 examination, meet imaging criteria to recommend percutaneous sampling or continued dedicated follow-up. Recent tsh wnl.    Stress Assessment & Plan: Increased stress as outlined. Discussed. Feels needs something to help level things out.  Start zoloft  as directed. Follow.  Schedule soon follow up.    Other orders -     Sertraline  HCl; Take 1 tablet (25 mg total) by mouth daily.  Dispense: 30 tablet; Refill: 2   I spent 45 minutes with the patient . Time spent discussing her current concerns and symptoms. Specifically time spent discussed increased stress, treatment and GI issues.  Time also spent discussing further w/up, evaluation and treatment.    Dellar Fenton, MD

## 2023-12-03 NOTE — Telephone Encounter (Signed)
 FYI- Patient was scheduled 7 weeks instead of 5. Due to availability on your schedule conflicting with patients schedule. We can move up if you prefer or leave as is.

## 2023-12-03 NOTE — Telephone Encounter (Signed)
 Patient saw Dr. Dellar Fenton today and Dr. Geralyn Knee would like to see her again in five weeks for a follow-up.  I scheduled patient's follow-up appointment on 01/31/2024, due to availability on Dr. Thayne Fine schedule and patient's schedule.  I let patient know that I will send Dr. Geralyn Knee a message so she will know that we had to go past five weeks.

## 2023-12-04 ENCOUNTER — Ambulatory Visit: Payer: Self-pay | Admitting: Internal Medicine

## 2023-12-09 IMAGING — MG MM DIGITAL SCREENING BILAT W/ TOMO AND CAD
8 series · 8 of 24 positions shown · non-contrast
Comparison: Previous exam(s).

CLINICAL DATA: Screening.

EXAM:
DIGITAL SCREENING BILATERAL MAMMOGRAM WITH TOMOSYNTHESIS AND CAD
TECHNIQUE: Bilateral screening digital craniocaudal and mediolateral oblique
mammograms were obtained. Bilateral screening digital breast
tomosynthesis was performed. The images were evaluated with
computer-aided detection.

[L CC synth-2D]
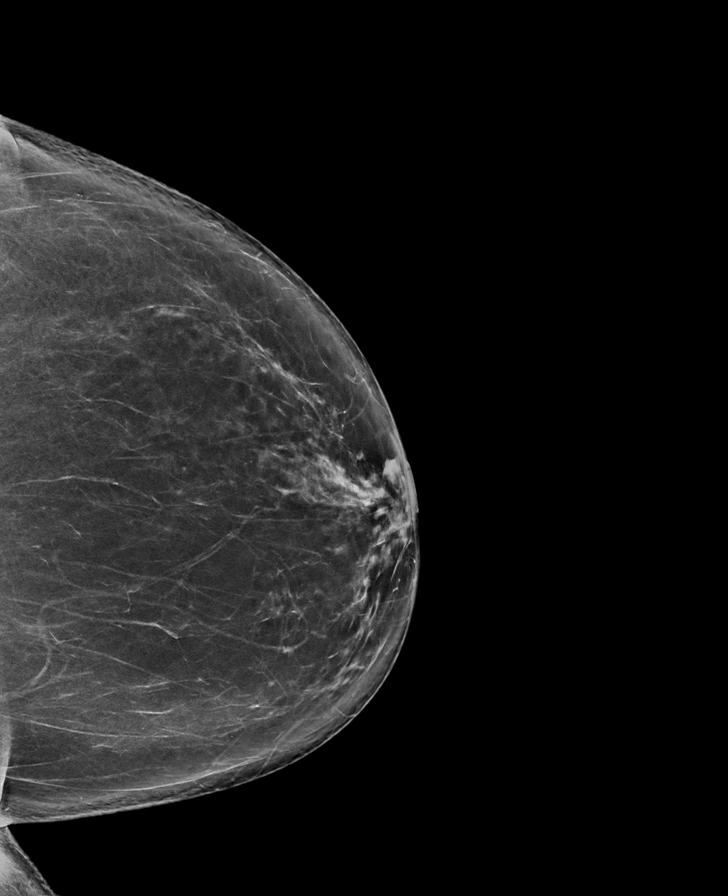

[L MLO synth-2D]
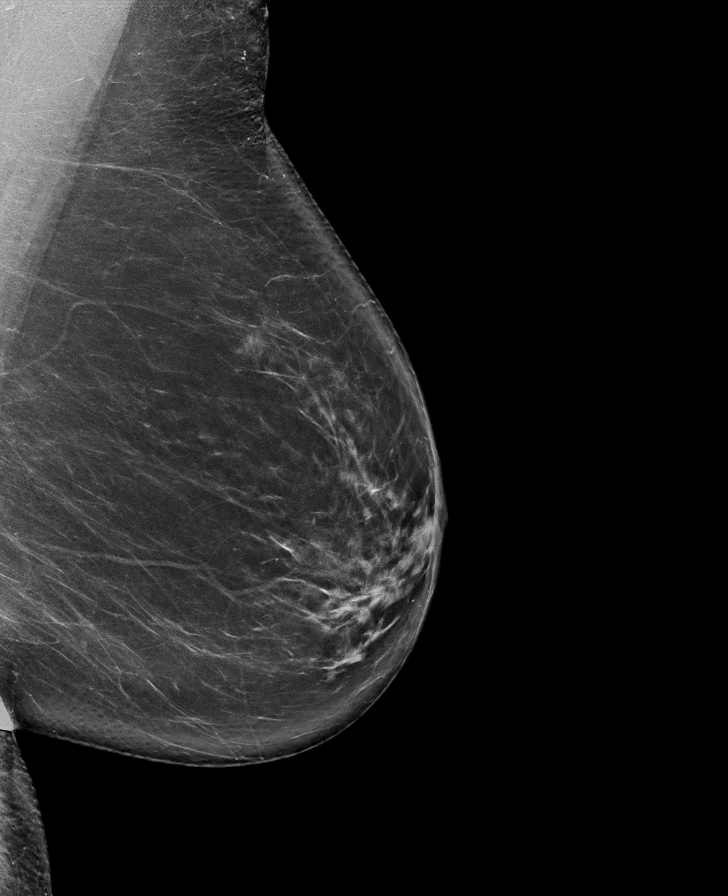

[R MLO synth-2D]
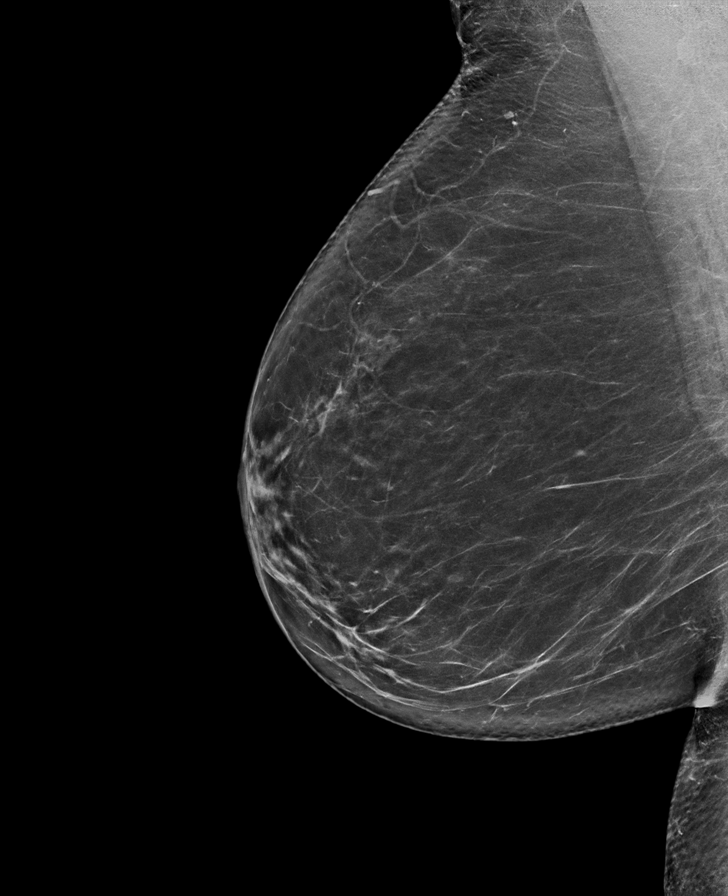

[R CC synth-2D]
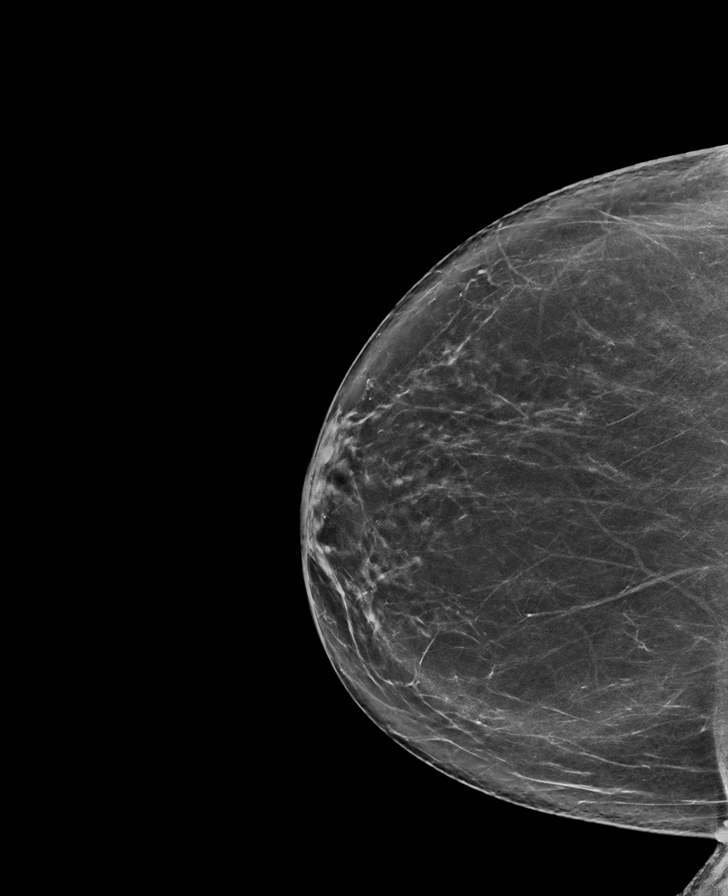

[R CC tomo · tomo slice 42/83.0]
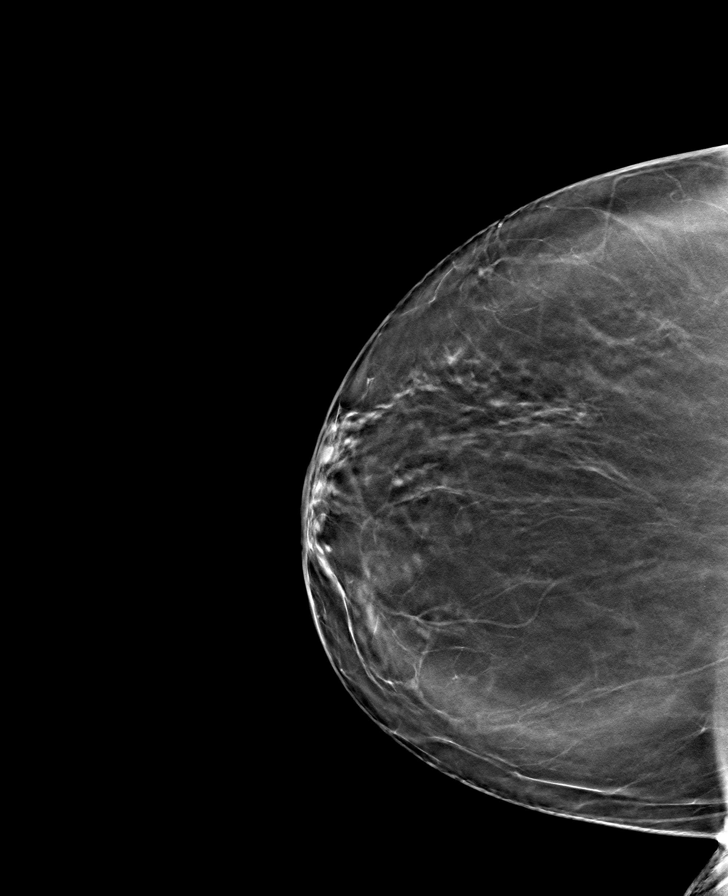

[L MLO tomo · tomo slice 45/90.0]
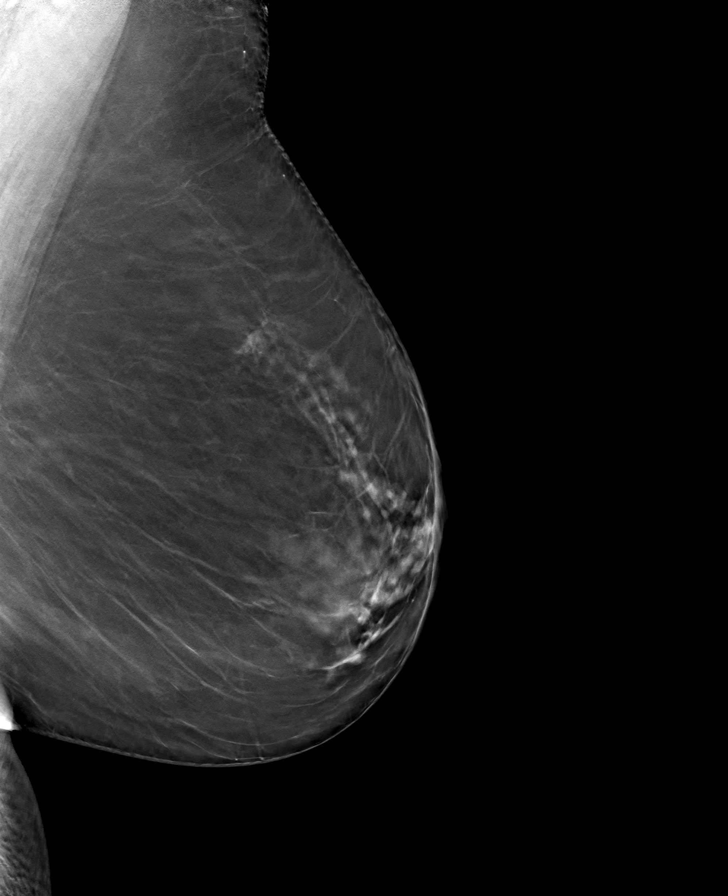

[L CC tomo · tomo slice 43/84.0]
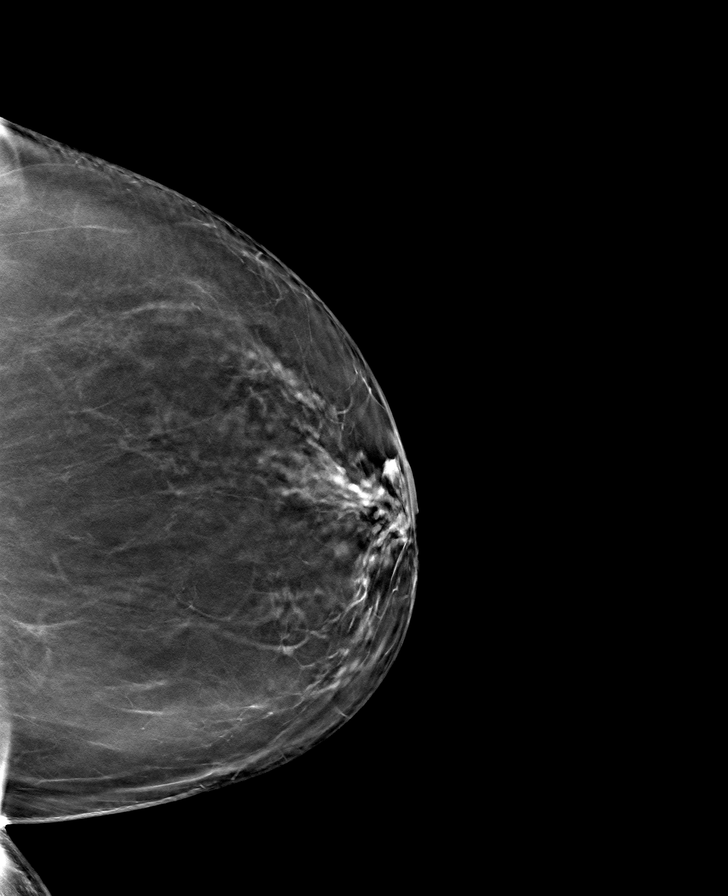

[R MLO tomo · tomo slice 43/84.0]
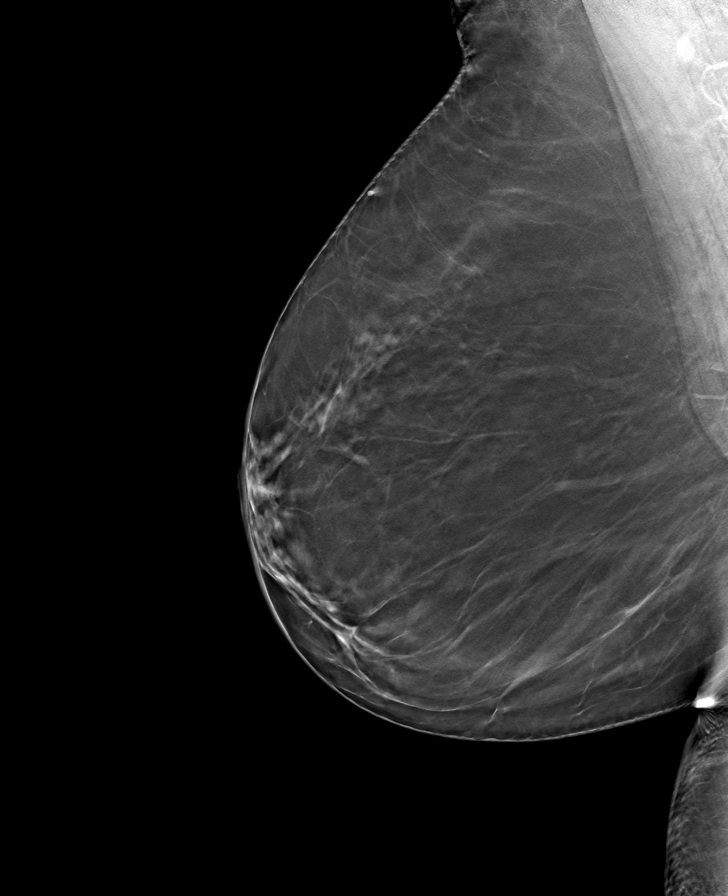

[8 of 24 positions shown; findings below may reference images not displayed]

ACR Breast Density Category b: There are scattered areas of
fibroglandular density.
FINDINGS: There are no findings suspicious for malignancy.
IMPRESSION: No mammographic evidence of malignancy. A result letter of this
screening mammogram will be mailed directly to the patient.

RECOMMENDATION:
Screening mammogram in one year. (Code:51-O-LD2)

BI-RADS CATEGORY  1: Negative.

## 2023-12-09 NOTE — Assessment & Plan Note (Signed)
 The 10-year ASCVD risk score (Arnett DK, et al., 2019) is: 10.2%   Values used to calculate the score:     Age: 73 years     Sex: Female     Is Non-Hispanic African American: No     Diabetic: No     Tobacco smoker: No     Systolic Blood Pressure: 128 mmHg     Is BP treated: No     HDL Cholesterol: 56.9 mg/dL     Total Cholesterol: 181 mg/dL  Discussed low cholesterol diet and exercise. Has declined cholesterol medication. Follow lipid panel.

## 2023-12-09 NOTE — Assessment & Plan Note (Signed)
 Of note abdominal ultrasound 10/2022 - ok. Take nexium  40mg  before breakfast and start pepcid  30 minutes before evening meal - take scheduled. Have discussed f/u with GI - given persistent upper symptoms despite PPI.

## 2023-12-09 NOTE — Assessment & Plan Note (Signed)
 Problems with diarrhea - intermittent. Some discomfort. Taking nexium . Forgets pepcid  in the evening. Some acid reflux symptoms. Feels stress is contributing to her symptoms. Check labs, including cbc, metabolic panel, liver panel and lipase. Take acid reflux medication scheduled. Treat stress as outlined. Follow.  Call with update.

## 2023-12-09 NOTE — Assessment & Plan Note (Signed)
 Low carb diet and exercise. Follow met b and A1c.   Lab Results  Component Value Date   HGBA1C 6.0 09/24/2023

## 2023-12-09 NOTE — Assessment & Plan Note (Signed)
 Increased stress as outlined. Discussed. Feels needs something to help level things out.  Start zoloft  as directed. Follow.  Schedule soon follow up.

## 2023-12-09 NOTE — Assessment & Plan Note (Signed)
 Saw ENT (Dr Andee Poles). Thyroid ultrasound (10/2022)-  Similar findings of multinodular goiter. No definitive worrisome new or enlarging thyroid nodules. None of the discretely measured thyroid nodules/pseudo nodules, many of which appear similar to the 2009 examination, meet imaging criteria to recommend percutaneous sampling or continued dedicated follow-up. Recent tsh wnl.

## 2023-12-09 NOTE — Assessment & Plan Note (Signed)
 Saw Dr Hooten 11/01/23 - left knee pain and right hip discomfort. Diagnosed with  right greater trochanteric bursitis and left knee OA and distal iliotibial band syndrome vs plica. Recommended topical ice and heat as directed prior to stretching, topical voltaren gel, exercises and PT. ` Her first PT session is today.  She is still having knee issues.  Plans to discuss with them at her physical therapy appointment.

## 2023-12-09 NOTE — Assessment & Plan Note (Signed)
 Colonoscopy 10/20/19 - two diminutive polyps in the rectum. Non bleeding internal hemorrhoids. hyperplastic polyp.  Recommended f/u colonoscopy in 5 years.

## 2023-12-11 NOTE — Telephone Encounter (Signed)
 We discussed multiple issues - per my last note. Discussed epigastric pain and msk issues. Given she is having these issues, please schedule appt for evaluation.

## 2023-12-17 DIAGNOSIS — G8929 Other chronic pain: Secondary | ICD-10-CM | POA: Diagnosis not present

## 2023-12-17 DIAGNOSIS — M25561 Pain in right knee: Secondary | ICD-10-CM | POA: Diagnosis not present

## 2023-12-25 ENCOUNTER — Other Ambulatory Visit: Payer: Self-pay | Admitting: Internal Medicine

## 2023-12-26 ENCOUNTER — Ambulatory Visit
Admission: RE | Admit: 2023-12-26 | Discharge: 2023-12-26 | Disposition: A | Discharge status: Home / Self Care | Source: Ambulatory Visit | Attending: Internal Medicine | Admitting: Internal Medicine

## 2023-12-26 DIAGNOSIS — Z1231 Encounter for screening mammogram for malignant neoplasm of breast: Secondary | ICD-10-CM | POA: Diagnosis not present

## 2024-01-02 DIAGNOSIS — G8929 Other chronic pain: Secondary | ICD-10-CM | POA: Diagnosis not present

## 2024-01-02 DIAGNOSIS — M25561 Pain in right knee: Secondary | ICD-10-CM | POA: Diagnosis not present

## 2024-01-07 ENCOUNTER — Encounter: Payer: Self-pay | Admitting: Internal Medicine

## 2024-01-07 MED ORDER — ALPRAZOLAM 0.25 MG PO TABS
0.2500 mg | ORAL_TABLET | Freq: Every day | ORAL | 0 refills | Status: AC | PRN
Start: 2024-01-07 — End: ?

## 2024-01-07 NOTE — Telephone Encounter (Signed)
 Pt is requesting a small rx of Alprazolam  to help her get through this week and the funeral. Pt stated that she has taken this before with no problems. Looks like this was last prescribed in 2022.

## 2024-01-07 NOTE — Telephone Encounter (Signed)
 Called [facility]. She has good support. Xanax  rx sent in for her.  Will let us  know if she needs anything.

## 2024-01-16 DIAGNOSIS — M25561 Pain in right knee: Secondary | ICD-10-CM | POA: Diagnosis not present

## 2024-01-16 DIAGNOSIS — G8929 Other chronic pain: Secondary | ICD-10-CM | POA: Diagnosis not present

## 2024-01-16 DIAGNOSIS — M25552 Pain in left hip: Secondary | ICD-10-CM | POA: Diagnosis not present

## 2024-01-16 DIAGNOSIS — M6281 Muscle weakness (generalized): Secondary | ICD-10-CM | POA: Diagnosis not present

## 2024-01-16 DIAGNOSIS — M25661 Stiffness of right knee, not elsewhere classified: Secondary | ICD-10-CM | POA: Diagnosis not present

## 2024-01-18 ENCOUNTER — Encounter: Payer: Self-pay | Admitting: Advanced Practice Midwife

## 2024-01-23 ENCOUNTER — Ambulatory Visit: Admitting: Internal Medicine

## 2024-01-25 NOTE — Telephone Encounter (Signed)
 Pt last seen on 12/03/23

## 2024-01-25 NOTE — Telephone Encounter (Signed)
 FYI- No acute issues. She cancelled her 7/23 appt. Wants to be seen sooner than 8/28 (first available). Will hold for work in spot.

## 2024-01-25 NOTE — Telephone Encounter (Signed)
 Noted

## 2024-01-25 NOTE — Telephone Encounter (Signed)
 Confirm no acute issues. We did discuss her GI issues during her last visit. Make sure she is taking her medication - including pepcid  as directed. Given it has been approximately 2 months, needs to be reevaluated to confirm what test would be the best next step. Any acute change or worsening symptoms, needs to be evaluated more acutely.

## 2024-01-28 NOTE — Telephone Encounter (Signed)
 Offered patient appointment. Pt declined. She is out of town until end of week

## 2024-01-29 ENCOUNTER — Telehealth: Payer: Self-pay

## 2024-01-29 NOTE — Telephone Encounter (Signed)
 Copied from CRM [id number]. Topic: General - Other >> Jan 29, 2024  8:49 AM Chasity T wrote: Reason for CRM: Patient is returning call for nurse trisha. Please return call to patient.

## 2024-01-29 NOTE — Telephone Encounter (Signed)
 LM to offer appt for Thursday. Appt is held on Dr Pete schedule. Can schedule if able to come or can do virtual if she would prefer.

## 2024-01-29 NOTE — Telephone Encounter (Signed)
 Pt scheduled

## 2024-01-29 NOTE — Telephone Encounter (Signed)
 See other note

## 2024-01-31 ENCOUNTER — Ambulatory Visit: Payer: Self-pay | Admitting: Internal Medicine

## 2024-01-31 ENCOUNTER — Ambulatory Visit
Admission: RE | Admit: 2024-01-31 | Discharge: 2024-01-31 | Disposition: A | Discharge status: Home / Self Care | Source: Ambulatory Visit | Attending: Internal Medicine | Admitting: Internal Medicine

## 2024-01-31 ENCOUNTER — Ambulatory Visit: Admitting: Internal Medicine

## 2024-01-31 ENCOUNTER — Ambulatory Visit
Admission: RE | Admit: 2024-01-31 | Discharge: 2024-01-31 | Disposition: A | Discharge status: Home / Self Care | Attending: Internal Medicine | Admitting: Internal Medicine

## 2024-01-31 VITALS — BP 130/70 | HR 70 | Resp 16 | Ht 64.0 in | Wt 181.2 lb

## 2024-01-31 DIAGNOSIS — F439 Reaction to severe stress, unspecified: Secondary | ICD-10-CM

## 2024-01-31 DIAGNOSIS — E7800 Pure hypercholesterolemia, unspecified: Secondary | ICD-10-CM | POA: Diagnosis not present

## 2024-01-31 DIAGNOSIS — M79672 Pain in left foot: Secondary | ICD-10-CM

## 2024-01-31 DIAGNOSIS — K219 Gastro-esophageal reflux disease without esophagitis: Secondary | ICD-10-CM | POA: Diagnosis not present

## 2024-01-31 DIAGNOSIS — R739 Hyperglycemia, unspecified: Secondary | ICD-10-CM

## 2024-01-31 DIAGNOSIS — R1030 Lower abdominal pain, unspecified: Secondary | ICD-10-CM

## 2024-01-31 DIAGNOSIS — M7989 Other specified soft tissue disorders: Secondary | ICD-10-CM | POA: Diagnosis not present

## 2024-01-31 DIAGNOSIS — Z860100 Personal history of colon polyps, unspecified: Secondary | ICD-10-CM

## 2024-01-31 DIAGNOSIS — E042 Nontoxic multinodular goiter: Secondary | ICD-10-CM

## 2024-01-31 DIAGNOSIS — M19072 Primary osteoarthritis, left ankle and foot: Secondary | ICD-10-CM | POA: Diagnosis not present

## 2024-01-31 DIAGNOSIS — M7732 Calcaneal spur, left foot: Secondary | ICD-10-CM | POA: Diagnosis not present

## 2024-01-31 LAB — CBC WITH DIFFERENTIAL/PLATELET
Basophils Absolute: 0 K/uL (ref 0.0–0.1)
Basophils Relative: 0.7 % (ref 0.0–3.0)
Eosinophils Absolute: 0.1 K/uL (ref 0.0–0.7)
Eosinophils Relative: 2.5 % (ref 0.0–5.0)
HCT: 41.4 % (ref 36.0–46.0)
Hemoglobin: 13.9 g/dL (ref 12.0–15.0)
Lymphocytes Relative: 25 % (ref 12.0–46.0)
Lymphs Abs: 1 K/uL (ref 0.7–4.0)
MCHC: 33.7 g/dL (ref 30.0–36.0)
MCV: 92.6 fl (ref 78.0–100.0)
Monocytes Absolute: 0.3 K/uL (ref 0.1–1.0)
Monocytes Relative: 6 % (ref 3.0–12.0)
Neutro Abs: 2.7 K/uL (ref 1.4–7.7)
Neutrophils Relative %: 65.8 % (ref 43.0–77.0)
Platelets: 232 K/uL (ref 150.0–400.0)
RBC: 4.47 Mil/uL (ref 3.87–5.11)
RDW: 13.4 % (ref 11.5–15.5)
WBC: 4.2 K/uL (ref 4.0–10.5)

## 2024-01-31 LAB — BASIC METABOLIC PANEL WITH GFR
BUN: 18 mg/dL (ref 6–23)
CO2: 25 meq/L (ref 19–32)
Calcium: 9.8 mg/dL (ref 8.4–10.5)
Chloride: 107 meq/L (ref 96–112)
Creatinine, Ser: 0.87 mg/dL (ref 0.40–1.20)
GFR: 66.79 mL/min (ref >60.00)
Glucose, Bld: 115 mg/dL — ABNORMAL HIGH (ref 70–99)
Potassium: 4 meq/L (ref 3.5–5.1)
Sodium: 140 meq/L (ref 135–145)

## 2024-01-31 MED ORDER — PANTOPRAZOLE SODIUM 40 MG PO TBEC: 40.0000 mg | DELAYED_RELEASE_TABLET | Freq: Every day | ORAL | 3 refills | Status: DC

## 2024-01-31 NOTE — Progress Notes (Signed)
 Subjective:    Patient ID: Lori Guerrero, female    DOB: 08-03-1950, 73 y.o.   MRN: [id number]  Patient here for  Chief Complaint  Patient presents with   Abdominal Pain    HPI Here for a work in appt. Work in to discuss persistent GI issues. Last visit, reported persistent bowel issues. Lower abdominal discomfort. Intermittent pain. Some nausea. Also, acid reflux. Discussed. Not taking PPI. Also reported some abdominal discomfort.  Also reported increased stress. Was started on zoloft . Not taking. Previous scratchy throat - relates to acid. Discussed treatment. Saw Dr Hooten 11/01/23 - left knee pain and right hip discomfort. Diagnosed with  right greater trochanteric bursitis and left knee OA and distal iliotibial band syndrome vs plica. Recommended topical ice and heat as directed prior to stretching, topical voltaren gel, exercises and PT.  Has been going to PT. No chest pain reported. Breathing stable. Did report she injured her foot - one week ago. Tripped over car jack. Left foot - black/bruised and some soft tissue swelling. Feel forward. Did not hit her head. No other injuries.    Past Medical History:  Diagnosis Date   GERD (gastroesophageal reflux disease)    Hyperglycemia    Hyperplastic colon polyp    Skin cancer    Vitamin D  deficiency    Past Surgical History:  Procedure Laterality Date   BREAST BIOPSY     25 years ago   MOHS SURGERY     skin cancer   Family History  Problem Relation Age of Onset   Heart disease Mother        irregular heart beat   Other Father        Kevon Eriksson Disease (CJD)   Biliary Cirrhosis Sister    Breast cancer Maternal Aunt    Breast cancer Paternal Aunt 45   Diabetes Maternal Grandmother    Cancer Paternal Grandfather        colon   Social History   Socioeconomic History   Marital status: Widowed    Spouse name: Not on file   Number of children: 1   Years of education: Not on file   Highest education level: Some  college, no degree  Occupational History   Occupation: retired  Tobacco Use   Smoking status: Never    Passive exposure: Past (Husband did smoke.)   Smokeless tobacco: Never  Vaping Use   Vaping status: Never Used  Substance and Sexual Activity   Alcohol use: Yes    Alcohol/week: 4.0 standard drinks of alcohol    Types: 4 Cans of beer per week    Comment: 1-2 beers once twice a week   Drug use: No   Sexual activity: Not Currently    Comment: 1st intercourse- 19, partners- 2, widow   Other Topics Concern   Not on file  Social History Narrative   Not on file   Social Drivers of Health   Financial Resource Strain: Low Risk  (12/03/2023)   [facility]   Overall Financial Resource Strain (CARDIA)    Difficulty of Paying Living Expenses: Not hard at all  Food Insecurity: No Food Insecurity (12/03/2023)   Received from [facility]   Hunger Vital Sign    Within the past 12 months, you worried that your food would run out before you got the money to buy more.: Never true    Within the past 12 months, the food you bought just didn't  last and you didn't have money to get more.: Never true  Transportation Needs: No Transportation Needs (12/03/2023)   Received from [facility] - Transportation    In the past 12 months, has lack of transportation kept you from medical appointments or from getting medications?: No    Lack of Transportation (Non-Medical): No  Physical Activity: Inactive (05/19/2023)   Exercise Vital Sign    Days of Exercise per Week: 0 days    Minutes of Exercise per Session: 40 min  Stress: Stress Concern Present (05/19/2023)   [employer] of Occupational [employer] - Occupational Stress Questionnaire    Feeling of Stress : To some extent  Social Connections: Moderately Integrated (05/19/2023)   Social Connection and Isolation Panel    Frequency of Communication with Friends and Family: More  than three times a week    Frequency of Social Gatherings with Friends and Family: More than three times a week    Attends Religious Services: More than 4 times per year    Active Member of [employer] or Organizations: Yes    Attends Banker Meetings: 1 to 4 times per year    Marital Status: Widowed  Recent Concern: Social Connections - Moderately Isolated (05/03/2023)   Social Connection and Isolation Panel    Frequency of Communication with Friends and Family: More than three times a week    Frequency of Social Gatherings with Friends and Family: More than three times a week    Attends Religious Services: More than 4 times per year    Active Member of [employer] or Organizations: No    Attends Banker Meetings: Never    Marital Status: Widowed     Review of Systems  Constitutional:  Negative for appetite change and unexpected weight change.  HENT:  Negative for congestion and sinus pressure.   Respiratory:  Negative for cough, chest tightness and shortness of breath.   Cardiovascular:  Negative for chest pain, palpitations and leg swelling.  Gastrointestinal:  Negative for vomiting.       Abdominal pain as outlined. Sone nausea. Acid reflux.   Genitourinary:  Negative for difficulty urinating and dysuria.  Musculoskeletal:  Negative for myalgias.       Foot pain as outlined.   Skin:  Negative for color change and rash.  Neurological:  Negative for dizziness and headaches.  Psychiatric/Behavioral:  Negative for agitation.        Objective:     BP 130/70   Pulse 70   Resp 16   Ht 5' 4 (1.626 m)   Wt 181 lb 3.2 oz (82.2 kg)   SpO2 98%   BMI 31.10 kg/m  Wt Readings from Last 3 Encounters:  01/31/24 181 lb 3.2 oz (82.2 kg)  12/03/23 182 lb 12.8 oz (82.9 kg)  09/26/23 182 lb 3.2 oz (82.6 kg)    Physical Exam Vitals reviewed.  Constitutional:      General: She is not in acute distress.    Appearance: Normal appearance.  HENT:     Head: Normocephalic  and atraumatic.     Right Ear: External ear normal.     Left Ear: External ear normal.  Eyes:     General: No scleral icterus.       Right eye: No discharge.        Left eye: No discharge.     Conjunctiva/sclera: Conjunctivae normal.  Neck:     Thyroid : No thyromegaly.  Cardiovascular:  Rate and Rhythm: Normal rate and regular rhythm.  Pulmonary:     Effort: No respiratory distress.     Breath sounds: Normal breath sounds. No wheezing.  Abdominal:     General: Bowel sounds are normal.     Palpations: Abdomen is soft.     Tenderness: There is abdominal tenderness.  Musculoskeletal:        General: No swelling or tenderness.     Cervical back: Neck supple. No tenderness.  Lymphadenopathy:     Cervical: No cervical adenopathy.  Skin:    Findings: No erythema or rash.  Neurological:     Mental Status: She is alert.  Psychiatric:        Mood and Affect: Mood normal.        Behavior: Behavior normal.         Outpatient Encounter Medications as of 01/31/2024  Medication Sig   pantoprazole  (PROTONIX ) 40 MG tablet Take 1 tablet (40 mg total) by mouth daily.   ALPRAZolam  (XANAX ) 0.25 MG tablet Take 1 tablet (0.25 mg total) by mouth daily as needed for anxiety.   famotidine  (PEPCID ) 20 MG tablet Take 1 tablet (20 mg total) by mouth daily before supper.   Multiple Vitamins-Minerals (MULTIVITAMIN PO) Take by mouth daily.   [DISCONTINUED] esomeprazole  (NEXIUM ) 40 MG capsule Take 1 capsule (40 mg total) by mouth daily.   [DISCONTINUED] sertraline  (ZOLOFT ) 25 MG tablet TAKE 1 TABLET (25 MG TOTAL) BY MOUTH DAILY.   No facility-administered encounter medications on file as of 01/31/2024.     Lab Results  Component Value Date   WBC 4.2 01/31/2024   HGB 13.9 01/31/2024   HCT 41.4 01/31/2024   PLT 232.0 01/31/2024   GLUCOSE 115 (H) 01/31/2024   CHOL 181 09/24/2023   TRIG 87.0 09/24/2023   HDL 56.90 09/24/2023   LDLCALC 107 (H) 09/24/2023   ALT 17 12/03/2023   AST 21  12/03/2023   NA 140 01/31/2024   K 4.0 01/31/2024   CL 107 01/31/2024   CREATININE 0.87 01/31/2024   BUN 18 01/31/2024   CO2 25 01/31/2024   TSH 2.71 09/24/2023   HGBA1C 6.0 09/24/2023    MM 3D SCREENING MAMMOGRAM BILATERAL BREAST Result Date: 12/28/2023 CLINICAL DATA:  Screening. EXAM: DIGITAL SCREENING BILATERAL MAMMOGRAM WITH TOMOSYNTHESIS AND CAD TECHNIQUE: Bilateral screening digital craniocaudal and mediolateral oblique mammograms were obtained. Bilateral screening digital breast tomosynthesis was performed. The images were evaluated with computer-aided detection. COMPARISON:  Previous exam(s). ACR Breast Density Category b: There are scattered areas of fibroglandular density. FINDINGS: There are no findings suspicious for malignancy. IMPRESSION: No mammographic evidence of malignancy. A result letter of this screening mammogram will be mailed directly to the patient. RECOMMENDATION: Screening mammogram in one year. (Code:SM-B-01Y) BI-RADS CATEGORY  1: Negative. Electronically Signed   By: Toribio Agreste M.D.   On: 12/28/2023 09:07       Assessment & Plan:  Lower abdominal pain Assessment & Plan: Persistent abdominal pain as outlined. Off PPI. With acid reflux. Start protonix . Discussed further w/up and evaluation. Check CT scan abd/pelvis. Further w/up pending results.   Orders: -     CBC with Differential/Platelet -     Basic metabolic panel with GFR -     CT ABDOMEN PELVIS W CONTRAST; Future  Left foot pain Assessment & Plan: Bruising after injury - left foot. Check xray.   Orders: -     DG Foot 2 Views Left; Future  Gastroesophageal reflux disease, unspecified whether esophagitis present  Assessment & Plan: Acid reflux as outlined. Start protonix .    History of colonic polyps Assessment & Plan: Colonoscopy 10/20/19 - two diminutive polyps in the rectum. Non bleeding internal hemorrhoids. hyperplastic polyp.  Recommended f/u colonoscopy in 5 years.     Hypercholesterolemia Assessment & Plan: The 10-year ASCVD risk score (Arnett DK, et al., 2019) is: 10.5%   Values used to calculate the score:     Age: 41 years     Clincally relevant sex: Female     Is Non-Hispanic African American: No     Diabetic: No     Tobacco smoker: No     Systolic Blood Pressure: 130 mmHg     Is BP treated: No     HDL Cholesterol: 56.9 mg/dL     Total Cholesterol: 181 mg/dL  Discussed low cholesterol diet and exercise. Has declined cholesterol medication. Follow lipid panel.    Hyperglycemia Assessment & Plan: Low carb diet and exercise. Follow met b and A1c.   Lab Results  Component Value Date   HGBA1C 6.0 09/24/2023      Multinodular goiter Assessment & Plan: Saw ENT (Dr Milissa). Thyroid  ultrasound (10/2022)-  Similar findings of multinodular goiter. No definitive worrisome new or enlarging thyroid  nodules. None of the discretely measured thyroid  nodules/pseudo nodules, many of which appear similar to the 2009 examination, meet imaging criteria to recommend percutaneous sampling or continued dedicated follow-up. Follow tsh.    Stress Assessment & Plan: Off zoloft . Hold on further medication at this time. Follow.    Other orders -     Pantoprazole  Sodium; Take 1 tablet (40 mg total) by mouth daily.  Dispense: 30 tablet; Refill: 3     Allena Hamilton, MD

## 2024-01-31 NOTE — Patient Instructions (Signed)
Take protonix 30 minutes before breakfast  Take pepcid 30 minutes before evening meal.

## 2024-02-03 ENCOUNTER — Encounter: Payer: Self-pay | Admitting: Internal Medicine

## 2024-02-03 NOTE — Assessment & Plan Note (Signed)
 Saw ENT (Dr Milissa). Thyroid  ultrasound (10/2022)-  Similar findings of multinodular goiter. No definitive worrisome new or enlarging thyroid  nodules. None of the discretely measured thyroid  nodules/pseudo nodules, many of which appear similar to the 2009 examination, meet imaging criteria to recommend percutaneous sampling or continued dedicated follow-up. Follow tsh.

## 2024-02-03 NOTE — Assessment & Plan Note (Signed)
 Acid reflux as outlined. Start protonix .

## 2024-02-03 NOTE — Assessment & Plan Note (Signed)
 Bruising after injury - left foot. Check xray.

## 2024-02-03 NOTE — Assessment & Plan Note (Signed)
 The 10-year ASCVD risk score (Arnett DK, et al., 2019) is: 10.5%   Values used to calculate the score:     Age: 73 years     Clincally relevant sex: Female     Is Non-Hispanic African American: No     Diabetic: No     Tobacco smoker: No     Systolic Blood Pressure: 130 mmHg     Is BP treated: No     HDL Cholesterol: 56.9 mg/dL     Total Cholesterol: 181 mg/dL  Discussed low cholesterol diet and exercise. Has declined cholesterol medication. Follow lipid panel.

## 2024-02-03 NOTE — Assessment & Plan Note (Signed)
 Low carb diet and exercise. Follow met b and A1c.   Lab Results  Component Value Date   HGBA1C 6.0 09/24/2023

## 2024-02-03 NOTE — Assessment & Plan Note (Signed)
 Colonoscopy 10/20/19 - two diminutive polyps in the rectum. Non bleeding internal hemorrhoids. hyperplastic polyp.  Recommended f/u colonoscopy in 5 years.

## 2024-02-03 NOTE — Assessment & Plan Note (Signed)
 Persistent abdominal pain as outlined. Off PPI. With acid reflux. Start protonix . Discussed further w/up and evaluation. Check CT scan abd/pelvis. Further w/up pending results.

## 2024-02-03 NOTE — Assessment & Plan Note (Signed)
 Off zoloft . Hold on further medication at this time. Follow.

## 2024-02-05 MED ORDER — FAMOTIDINE 20 MG PO TABS: 20.0000 mg | ORAL_TABLET | Freq: Every day | ORAL | 1 refills | Status: DC

## 2024-02-05 NOTE — Telephone Encounter (Signed)
 Pt would like a prescription of Pepcid  sent in

## 2024-02-05 NOTE — Telephone Encounter (Signed)
 Rx sent in for pepcid  and pt notified via my chart.

## 2024-02-05 NOTE — Telephone Encounter (Signed)
 Please call and notify her that if she is not wanting to start protonix , I recommend her taking pepcid  20mg  q day. This is over the counter. Also, I have ordered the CT scan. Someone should be calling her to schedule.

## 2024-02-08 ENCOUNTER — Ambulatory Visit
Admission: RE | Admit: 2024-02-08 | Discharge: 2024-02-08 | Disposition: A | Discharge status: Home / Self Care | Source: Ambulatory Visit | Attending: Internal Medicine | Admitting: Internal Medicine

## 2024-02-08 DIAGNOSIS — R1030 Lower abdominal pain, unspecified: Secondary | ICD-10-CM | POA: Insufficient documentation

## 2024-02-08 DIAGNOSIS — K5730 Diverticulosis of large intestine without perforation or abscess without bleeding: Secondary | ICD-10-CM | POA: Diagnosis not present

## 2024-02-08 DIAGNOSIS — R140 Abdominal distension (gaseous): Secondary | ICD-10-CM | POA: Diagnosis not present

## 2024-02-08 MED ORDER — IOHEXOL 300 MG/ML  SOLN
100.0000 mL | Freq: Once | INTRAMUSCULAR | Status: AC | PRN
  Administered 2024-02-08: 100 mL via INTRAVENOUS

## 2024-02-12 ENCOUNTER — Other Ambulatory Visit: Payer: Self-pay | Admitting: Internal Medicine

## 2024-02-12 DIAGNOSIS — R109 Unspecified abdominal pain: Secondary | ICD-10-CM

## 2024-02-12 DIAGNOSIS — R140 Abdominal distension (gaseous): Secondary | ICD-10-CM

## 2024-02-12 NOTE — Progress Notes (Signed)
 Order placed for GI referral.

## 2024-02-20 DIAGNOSIS — E041 Nontoxic single thyroid nodule: Secondary | ICD-10-CM | POA: Diagnosis not present

## 2024-02-20 DIAGNOSIS — J300 Vasomotor rhinitis: Secondary | ICD-10-CM | POA: Diagnosis not present

## 2024-02-20 DIAGNOSIS — R1314 Dysphagia, pharyngoesophageal phase: Secondary | ICD-10-CM | POA: Diagnosis not present

## 2024-02-20 DIAGNOSIS — K219 Gastro-esophageal reflux disease without esophagitis: Secondary | ICD-10-CM | POA: Diagnosis not present

## 2024-02-25 ENCOUNTER — Other Ambulatory Visit: Payer: Self-pay | Admitting: Otolaryngology

## 2024-02-25 DIAGNOSIS — K219 Gastro-esophageal reflux disease without esophagitis: Secondary | ICD-10-CM

## 2024-02-25 DIAGNOSIS — R1310 Dysphagia, unspecified: Secondary | ICD-10-CM

## 2024-03-05 DIAGNOSIS — L570 Actinic keratosis: Secondary | ICD-10-CM | POA: Diagnosis not present

## 2024-03-05 DIAGNOSIS — Z85828 Personal history of other malignant neoplasm of skin: Secondary | ICD-10-CM | POA: Diagnosis not present

## 2024-03-05 DIAGNOSIS — D2361 Other benign neoplasm of skin of right upper limb, including shoulder: Secondary | ICD-10-CM | POA: Diagnosis not present

## 2024-03-05 DIAGNOSIS — Z08 Encounter for follow-up examination after completed treatment for malignant neoplasm: Secondary | ICD-10-CM | POA: Diagnosis not present

## 2024-03-05 DIAGNOSIS — D1801 Hemangioma of skin and subcutaneous tissue: Secondary | ICD-10-CM | POA: Diagnosis not present

## 2024-03-05 DIAGNOSIS — Z86006 Personal history of melanoma in-situ: Secondary | ICD-10-CM | POA: Diagnosis not present

## 2024-03-05 DIAGNOSIS — L821 Other seborrheic keratosis: Secondary | ICD-10-CM | POA: Diagnosis not present

## 2024-03-05 DIAGNOSIS — X32XXXA Exposure to sunlight, initial encounter: Secondary | ICD-10-CM | POA: Diagnosis not present

## 2024-03-05 DIAGNOSIS — Z85820 Personal history of malignant melanoma of skin: Secondary | ICD-10-CM | POA: Diagnosis not present

## 2024-03-05 DIAGNOSIS — L814 Other melanin hyperpigmentation: Secondary | ICD-10-CM | POA: Diagnosis not present

## 2024-03-10 ENCOUNTER — Ambulatory Visit
Admission: RE | Admit: 2024-03-10 | Discharge: 2024-03-10 | Disposition: A | Discharge status: Home / Self Care | Source: Ambulatory Visit | Attending: Otolaryngology | Admitting: Otolaryngology

## 2024-03-10 DIAGNOSIS — R1310 Dysphagia, unspecified: Secondary | ICD-10-CM | POA: Diagnosis not present

## 2024-03-10 DIAGNOSIS — K219 Gastro-esophageal reflux disease without esophagitis: Secondary | ICD-10-CM | POA: Insufficient documentation

## 2024-03-10 DIAGNOSIS — R0989 Other specified symptoms and signs involving the circulatory and respiratory systems: Secondary | ICD-10-CM | POA: Diagnosis not present

## 2024-03-10 NOTE — Therapy (Signed)
 Modified Barium Swallow Study  Patient Details  Name: Lori Guerrero MRN: [id number] Date of Birth: 14-Feb-1951  Today's Date: 03/10/2024  Modified Barium Swallow completed.  Full report located under Chart Review in the Imaging Section.  History of Present Illness Pt is a 73 y.o. female who presents for MBSS in setting of GERD. Pt with complaints of dysphagia to solids and liquids. Pt with hx anosmia, GERD, chronic sinusitis, hypercholesterolemia, hyperglycemia, and thyroid  nodule.   Clinical Impression Pt demonstrated intact oropharyngeal swallow function with age-related changes appreciated (e.g. timing of swallow initiation, +trace pharyngeal stasis). Recommend continuation of a regular diet with thin liquids with standard aspiration/reflux precautions. No f/u ST services warranted. Noted pt with pending GI consult. Factors that may increase risk of adverse event in presence of aspiration Noe & Lianne 2021):    Swallow Evaluation Recommendations Recommendations: PO diet PO Diet Recommendation: Regular;Thin liquids (Level 0) Liquid Administration via:  (as tolerated) Medication Administration:  (as tolerated) Supervision: Patient able to self-feed Swallowing strategies  : Slow rate;Small bites/sips;Follow solids with liquids Postural changes: Position pt fully upright for meals (upright 60-90 minutes after POs) Oral care recommendations: Oral care BID (2x/day) Recommended consults: Consider GI consultation (pending at time of MBSS)     Delon Bangs, M.S., CCC-SLP Speech-Language Pathologist [facility] [phone] (ASCOM)  Delon HERO Kendria Halberg 03/10/2024,1:36 PM

## 2024-03-11 ENCOUNTER — Ambulatory Visit: Admitting: Internal Medicine

## 2024-03-28 ENCOUNTER — Ambulatory Visit: Admitting: Internal Medicine

## 2024-03-28 DIAGNOSIS — R1030 Lower abdominal pain, unspecified: Secondary | ICD-10-CM

## 2024-03-31 DIAGNOSIS — L570 Actinic keratosis: Secondary | ICD-10-CM | POA: Diagnosis not present

## 2024-03-31 DIAGNOSIS — X32XXXA Exposure to sunlight, initial encounter: Secondary | ICD-10-CM | POA: Diagnosis not present

## 2024-03-31 DIAGNOSIS — L2989 Other pruritus: Secondary | ICD-10-CM | POA: Diagnosis not present

## 2024-03-31 DIAGNOSIS — L538 Other specified erythematous conditions: Secondary | ICD-10-CM | POA: Diagnosis not present

## 2024-03-31 DIAGNOSIS — L820 Inflamed seborrheic keratosis: Secondary | ICD-10-CM | POA: Diagnosis not present

## 2024-03-31 DIAGNOSIS — R208 Other disturbances of skin sensation: Secondary | ICD-10-CM | POA: Diagnosis not present

## 2024-04-15 ENCOUNTER — Ambulatory Visit (INDEPENDENT_AMBULATORY_CARE_PROVIDER_SITE_OTHER): Admitting: Internal Medicine

## 2024-04-15 VITALS — BP 112/64 | HR 70 | Temp 97.9°F | Ht 64.0 in | Wt 182.2 lb

## 2024-04-15 DIAGNOSIS — E042 Nontoxic multinodular goiter: Secondary | ICD-10-CM | POA: Diagnosis not present

## 2024-04-15 DIAGNOSIS — E7800 Pure hypercholesterolemia, unspecified: Secondary | ICD-10-CM

## 2024-04-15 DIAGNOSIS — Z860100 Personal history of colon polyps, unspecified: Secondary | ICD-10-CM | POA: Diagnosis not present

## 2024-04-15 DIAGNOSIS — F439 Reaction to severe stress, unspecified: Secondary | ICD-10-CM

## 2024-04-15 DIAGNOSIS — R739 Hyperglycemia, unspecified: Secondary | ICD-10-CM | POA: Diagnosis not present

## 2024-04-15 NOTE — Progress Notes (Signed)
 Subjective:    Patient ID: Lori Guerrero, female    DOB: [birth year], 73 y.o.   MRN: [id number]  Patient here for  Chief Complaint  Patient presents with   Medical Management of Chronic Issues    4 wk f/u    HPI Here for a scheduled follow up - recently evaluated by ST - 03/10/24 - intact oropharyngeal swallow function. Recommended to continue regular diet with thin liquids with standard aspiration/reflux precautions. Last visit - persistent abdominal discomfort. She is doing better now. Taking pepcid . No chest pain or sob reported. No increased cough or congestion. Previous fall. Aggravated back. Massage helps. No significant residual issues or limitations. Some right lower abdominal pain - no significant issue. Discussed f/u - further w/up. Will monitor.    Past Medical History:  Diagnosis Date   GERD (gastroesophageal reflux disease)    Hyperglycemia    Hyperplastic colon polyp    Skin cancer    Vitamin D  deficiency    Past Surgical History:  Procedure Laterality Date   BREAST BIOPSY     25 years ago   MOHS SURGERY     skin cancer   Family History  Problem Relation Age of Onset   Heart disease Mother        irregular heart beat   Other Father        Kevon Eriksson Disease (CJD)   Biliary Cirrhosis Sister    Breast cancer Maternal Aunt    Breast cancer Paternal Aunt 37   Diabetes Maternal Grandmother    Cancer Paternal Grandfather        colon   Social History   Socioeconomic History   Marital status: Widowed    Spouse name: Not on file   Number of children: 1   Years of education: Not on file   Highest education level: Some college, no degree  Occupational History   Occupation: retired  Tobacco Use   Smoking status: Never    Passive exposure: Past (Husband did smoke.)   Smokeless tobacco: Never  Vaping Use   Vaping status: Never Used  Substance and Sexual Activity   Alcohol use: Yes    Alcohol/week: 4.0 standard drinks of alcohol    Types: 4 Cans of  beer per week    Comment: 1-2 beers once twice a week   Drug use: No   Sexual activity: Not Currently    Comment: 1st intercourse- 19, partners- 2, widow   Other Topics Concern   Not on file  Social History Narrative   Not on file   Social Drivers of Health   Financial Resource Strain: Low Risk  (04/14/2024)   Overall Financial Resource Strain (CARDIA)    Difficulty of Paying Living Expenses: Not hard at all  Food Insecurity: No Food Insecurity (04/14/2024)   Hunger Vital Sign    Worried About Running Out of Food in the Last Year: Never true    Ran Out of Food in the Last Year: Never true  Transportation Needs: No Transportation Needs (04/14/2024)   PRAPARE - Administrator, Civil Service (Medical): No    Lack of Transportation (Non-Medical): No  Physical Activity: Insufficiently Active (04/14/2024)   Exercise Vital Sign    Days of Exercise per Week: 5 days    Minutes of Exercise per Session: 10 min  Stress: Stress Concern Present (04/14/2024)   [employer] of Occupational Health - Occupational Stress Questionnaire    Feeling of Stress: To some extent  Social Connections: Moderately Integrated (04/14/2024)   Social Connection and Isolation Panel    Frequency of Communication with Friends and Family: More than three times a week    Frequency of Social Gatherings with Friends and Family: Twice a week    Attends Religious Services: More than 4 times per year    Active Member of [employer] or Organizations: Yes    Attends Banker Meetings: 1 to 4 times per year    Marital Status: Widowed     Review of Systems  Constitutional:  Negative for appetite change and unexpected weight change.  HENT:  Negative for congestion and sinus pressure.   Respiratory:  Negative for cough, chest tightness and shortness of breath.   Cardiovascular:  Negative for chest pain, palpitations and leg swelling.  Gastrointestinal:  Negative for abdominal pain, diarrhea, nausea  and vomiting.  Genitourinary:  Negative for difficulty urinating and dysuria.  Musculoskeletal:  Negative for joint swelling and myalgias.  Skin:  Negative for color change and rash.  Neurological:  Negative for dizziness and headaches.  Psychiatric/Behavioral:  Negative for agitation and dysphoric mood.        Objective:     BP 112/64   Pulse 70   Temp 97.9 F (36.6 C) (Oral)   Ht 5' 4 (1.626 m)   Wt 182 lb 4 oz (82.7 kg)   SpO2 97%   BMI 31.28 kg/m  Wt Readings from Last 3 Encounters:  04/15/24 182 lb 4 oz (82.7 kg)  01/31/24 181 lb 3.2 oz (82.2 kg)  12/03/23 182 lb 12.8 oz (82.9 kg)    Physical Exam Vitals reviewed.  Constitutional:      General: She is not in acute distress.    Appearance: Normal appearance.  HENT:     Head: Normocephalic and atraumatic.     Right Ear: External ear normal.     Left Ear: External ear normal.     Mouth/Throat:     Pharynx: No oropharyngeal exudate or posterior oropharyngeal erythema.  Eyes:     General: No scleral icterus.       Right eye: No discharge.        Left eye: No discharge.     Conjunctiva/sclera: Conjunctivae normal.  Neck:     Thyroid : No thyromegaly.  Cardiovascular:     Rate and Rhythm: Normal rate and regular rhythm.  Pulmonary:     Effort: No respiratory distress.     Breath sounds: Normal breath sounds. No wheezing.  Abdominal:     General: Bowel sounds are normal.     Palpations: Abdomen is soft.     Tenderness: There is no abdominal tenderness.  Musculoskeletal:        General: No swelling or tenderness.     Cervical back: Neck supple. No tenderness.  Lymphadenopathy:     Cervical: No cervical adenopathy.  Skin:    Findings: No erythema or rash.  Neurological:     Mental Status: She is alert.  Psychiatric:        Mood and Affect: Mood normal.        Behavior: Behavior normal.         Outpatient Encounter Medications as of 04/15/2024  Medication Sig   ALPRAZolam  (XANAX ) 0.25 MG tablet Take  1 tablet (0.25 mg total) by mouth daily as needed for anxiety.   famotidine  (PEPCID ) 20 MG tablet Take 1 tablet (20 mg total) by mouth daily before supper.   Multiple Vitamins-Minerals (MULTIVITAMIN PO) Take by  mouth daily.   [DISCONTINUED] pantoprazole  (PROTONIX ) 40 MG tablet Take 1 tablet (40 mg total) by mouth daily.   No facility-administered encounter medications on file as of 04/15/2024.     Lab Results  Component Value Date   WBC 4.2 01/31/2024   HGB 13.9 01/31/2024   HCT 41.4 01/31/2024   PLT 232.0 01/31/2024   GLUCOSE 115 (H) 01/31/2024   CHOL 181 09/24/2023   TRIG 87.0 09/24/2023   HDL 56.90 09/24/2023   LDLCALC 107 (H) 09/24/2023   ALT 17 12/03/2023   AST 21 12/03/2023   NA 140 01/31/2024   K 4.0 01/31/2024   CL 107 01/31/2024   CREATININE 0.87 01/31/2024   BUN 18 01/31/2024   CO2 25 01/31/2024   TSH 2.71 09/24/2023   HGBA1C 6.0 09/24/2023    DG SWALLOW FUNC OP MEDICARE SPEECH PATH Result Date: 03/10/2024 CLINICAL DATA:  73 year old female with reports of globus sensation with solids and liquids. EXAM: MODIFIED BARIUM SWALLOW TECHNIQUE: Different consistencies of barium were administered orally to the patient by the Speech Pathologist. Imaging of the pharynx was performed in the lateral projection. McKenzie McInnis PA-C was present in the fluoroscopy room during this study, which was supervised and interpreted by Dr. VEAR Lent. FLUOROSCOPY: Radiation Exposure Index (as provided by the fluoroscopic device): 9.1 mGy Kerma COMPARISON:  None Available. FINDINGS: Vestibular  Penetration:  None seen. Aspiration:  None seen. Other:  None. IMPRESSION: Unremarkable modified barium swallow. Please refer to the Speech Pathologists report for complete details and recommendations. Electronically Signed   By: Wilkie Lent M.D.   On: 03/10/2024 16:37       Assessment & Plan:  Stress Assessment & Plan: Overall appears to be handling things relatively well. On no medication at  this time. Overall stable. Follow.    Multinodular goiter Assessment & Plan: Saw ENT (Dr Milissa). Thyroid  ultrasound (10/2022)-  Similar findings of multinodular goiter. No definitive worrisome new or enlarging thyroid  nodules. None of the discretely measured thyroid  nodules/pseudo nodules, many of which appear similar to the 2009 examination, meet imaging criteria to recommend percutaneous sampling or continued dedicated follow-up. Follow tsh.    Hyperglycemia Assessment & Plan: Low carb diet and exercise. Follow met b and A1c.   Lab Results  Component Value Date   HGBA1C 6.0 09/24/2023      Hypercholesterolemia Assessment & Plan: The 10-year ASCVD risk score (Arnett DK, et al., 2019) is: 8.9%   Values used to calculate the score:     Age: 83 years     Clincally relevant sex: Female     Is Non-Hispanic African American: No     Diabetic: No     Tobacco smoker: No     Systolic Blood Pressure: 112 mmHg     Is BP treated: No     HDL Cholesterol: 56.9 mg/dL     Total Cholesterol: 181 mg/dL  Discussed low cholesterol diet and exercise. Has declined cholesterol medication. Follow lipid panel.    History of colonic polyps Assessment & Plan: Colonoscopy 10/20/19 - two diminutive polyps in the rectum. Non bleeding internal hemorrhoids. hyperplastic polyp.  Recommended f/u colonoscopy in 5 years.       Allena Hamilton, MD

## 2024-04-21 ENCOUNTER — Encounter: Payer: Self-pay | Admitting: Internal Medicine

## 2024-04-21 NOTE — Assessment & Plan Note (Signed)
 Colonoscopy 10/20/19 - two diminutive polyps in the rectum. Non bleeding internal hemorrhoids. hyperplastic polyp.  Recommended f/u colonoscopy in 5 years.

## 2024-04-21 NOTE — Assessment & Plan Note (Signed)
 Saw ENT (Dr Milissa). Thyroid  ultrasound (10/2022)-  Similar findings of multinodular goiter. No definitive worrisome new or enlarging thyroid  nodules. None of the discretely measured thyroid  nodules/pseudo nodules, many of which appear similar to the 2009 examination, meet imaging criteria to recommend percutaneous sampling or continued dedicated follow-up. Follow tsh.

## 2024-04-21 NOTE — Assessment & Plan Note (Signed)
 The 10-year ASCVD risk score (Arnett DK, et al., 2019) is: 8.9%   Values used to calculate the score:     Age: 72 years     Clincally relevant sex: Female     Is Non-Hispanic African American: No     Diabetic: No     Tobacco smoker: No     Systolic Blood Pressure: 112 mmHg     Is BP treated: No     HDL Cholesterol: 56.9 mg/dL     Total Cholesterol: 181 mg/dL  Discussed low cholesterol diet and exercise. Has declined cholesterol medication. Follow lipid panel.

## 2024-04-21 NOTE — Assessment & Plan Note (Signed)
 Low carb diet and exercise. Follow met b and A1c.   Lab Results  Component Value Date   HGBA1C 6.0 09/24/2023

## 2024-04-21 NOTE — Assessment & Plan Note (Signed)
 Overall appears to be handling things relatively well. On no medication at this time. Overall stable. Follow.

## 2024-04-23 ENCOUNTER — Other Ambulatory Visit

## 2024-04-25 ENCOUNTER — Other Ambulatory Visit

## 2024-04-25 ENCOUNTER — Other Ambulatory Visit (INDEPENDENT_AMBULATORY_CARE_PROVIDER_SITE_OTHER)

## 2024-04-25 DIAGNOSIS — R739 Hyperglycemia, unspecified: Secondary | ICD-10-CM | POA: Diagnosis not present

## 2024-04-25 DIAGNOSIS — E7800 Pure hypercholesterolemia, unspecified: Secondary | ICD-10-CM

## 2024-04-25 DIAGNOSIS — E042 Nontoxic multinodular goiter: Secondary | ICD-10-CM | POA: Diagnosis not present

## 2024-04-25 NOTE — Addendum Note (Signed)
 Addended by: MARYLEN PRO A on: 04/25/2024 07:57 AM   Modules accepted: Orders

## 2024-04-27 ENCOUNTER — Other Ambulatory Visit: Payer: Self-pay | Admitting: Internal Medicine

## 2024-04-27 LAB — LIPID PANEL
Chol/HDL Ratio: 3.7 ratio (ref 0.0–4.4)
Cholesterol, Total: 183 mg/dL (ref 100–199)
HDL: 50 mg/dL (ref >39)
LDL Chol Calc (NIH): 111 mg/dL — ABNORMAL HIGH (ref 0–99)
Triglycerides: 122 mg/dL (ref 0–149)
VLDL Cholesterol Cal: 22 mg/dL (ref 5–40)

## 2024-04-27 LAB — CBC WITH DIFFERENTIAL/PLATELET
Basophils Absolute: 0 x10E3/uL (ref 0.0–0.2)
Basos: 1 %
EOS (ABSOLUTE): 0.1 x10E3/uL (ref 0.0–0.4)
Eos: 2 %
Hematocrit: 41.8 % (ref 34.0–46.6)
Hemoglobin: 14.2 g/dL (ref 11.1–15.9)
Immature Grans (Abs): 0 x10E3/uL (ref 0.0–0.1)
Immature Granulocytes: 0 %
Lymphocytes Absolute: 1 x10E3/uL (ref 0.7–3.1)
Lymphs: 19 %
MCH: 32.5 pg (ref 26.6–33.0)
MCHC: 34 g/dL (ref 31.5–35.7)
MCV: 96 fL (ref 79–97)
Monocytes Absolute: 0.3 x10E3/uL (ref 0.1–0.9)
Monocytes: 6 %
Neutrophils Absolute: 3.9 x10E3/uL (ref 1.4–7.0)
Neutrophils: 72 %
Platelets: 210 x10E3/uL (ref 150–450)
RBC: 4.37 x10E6/uL (ref 3.77–5.28)
RDW: 13 % (ref 11.7–15.4)
WBC: 5.4 x10E3/uL (ref 3.4–10.8)

## 2024-04-27 LAB — HEMOGLOBIN A1C
Est. average glucose Bld gHb Est-mCnc: 117 mg/dL
Hgb A1c MFr Bld: 5.7 % — ABNORMAL HIGH (ref 4.8–5.6)

## 2024-04-27 LAB — BASIC METABOLIC PANEL WITH GFR
BUN/Creatinine Ratio: 22 (ref 12–28)
BUN: 16 mg/dL (ref 8–27)
CO2: 20 mmol/L (ref 20–29)
Calcium: 9.3 mg/dL (ref 8.7–10.3)
Chloride: 106 mmol/L (ref 96–106)
Creatinine, Ser: 0.74 mg/dL (ref 0.57–1.00)
Glucose: 111 mg/dL — ABNORMAL HIGH (ref 70–99)
Potassium: 4.2 mmol/L (ref 3.5–5.2)
Sodium: 139 mmol/L (ref 134–144)
eGFR: 86 mL/min/1.73 (ref >59)

## 2024-04-27 LAB — HEPATIC FUNCTION PANEL
ALT: 17 IU/L (ref 0–32)
AST: 23 IU/L (ref 0–40)
Albumin: 4.1 g/dL (ref 3.8–4.8)
Alkaline Phosphatase: 126 IU/L (ref 49–135)
Bilirubin Total: 0.4 mg/dL (ref 0.0–1.2)
Bilirubin, Direct: 0.14 mg/dL (ref 0.00–0.40)
Total Protein: 6.3 g/dL (ref 6.0–8.5)

## 2024-04-27 LAB — TSH: TSH: 1.33 u[IU]/mL (ref 0.450–4.500)

## 2024-04-29 ENCOUNTER — Ambulatory Visit: Payer: Self-pay | Admitting: Internal Medicine

## 2024-04-29 NOTE — Telephone Encounter (Signed)
 Copied from CRM [id number]. Topic: Clinical - Lab/Test Results >> Apr 29, 2024  4:03 PM Lori Guerrero wrote: Reason for CRM: patient called back and I read the message to her and she want to ask about her CO2 because it dropped and she want to know why. Patient stated it was in the green [phone]

## 2024-05-02 NOTE — Telephone Encounter (Signed)
 Please call her. Her labs are normal. The GFR 86 is good - this is a measure of her kidney function and the higher the better. Her thyroid  test and CO2 are within the normal range. If she is still concerned - ok to schedule appt to discuss

## 2024-05-09 ENCOUNTER — Ambulatory Visit (INDEPENDENT_AMBULATORY_CARE_PROVIDER_SITE_OTHER): Payer: Medicare PPO | Admitting: *Deleted

## 2024-05-09 VITALS — Ht 64.0 in | Wt 177.2 lb

## 2024-05-09 DIAGNOSIS — Z780 Asymptomatic menopausal state: Secondary | ICD-10-CM

## 2024-05-09 DIAGNOSIS — Z0000 Encounter for general adult medical examination without abnormal findings: Secondary | ICD-10-CM | POA: Diagnosis not present

## 2024-05-09 NOTE — Progress Notes (Signed)
 Subjective:   Lori Guerrero is a 73 y.o. female who presents for a Medicare Annual Wellness Visit. I connected with  SENIAH LAWRENCE on 05/09/24 by a audio enabled telemedicine application and verified that I am speaking with the correct person using two identifiers.  Patient Location: Home  Provider Location: Home Office  Persons Participating in Visit: Patient.  I discussed the limitations of evaluation and management by telemedicine. The patient expressed understanding and agreed to proceed.  Vital Signs: Because this visit was a virtual/telehealth visit, some criteria may be missing or patient reported. Any vitals not documented were not able to be obtained and vitals that have been documented are patient reported.   Allergies (verified) Patient has no known allergies.   History: Past Medical History:  Diagnosis Date   GERD (gastroesophageal reflux disease)    Hyperglycemia    Hyperplastic colon polyp    Skin cancer    Vitamin D  deficiency    Past Surgical History:  Procedure Laterality Date   BREAST BIOPSY     25 years ago   MOHS SURGERY     skin cancer   Family History  Problem Relation Age of Onset   Heart disease Mother        irregular heart beat   Other Father        Kevon Eriksson Disease (CJD)   Biliary Cirrhosis Sister    Breast cancer Maternal Aunt    Breast cancer Paternal Aunt 68   Diabetes Maternal Grandmother    Cancer Paternal Grandfather        colon   Social History   Occupational History   Occupation: retired  Tobacco Use   Smoking status: Never    Passive exposure: Past (Husband did smoke.)   Smokeless tobacco: Never  Vaping Use   Vaping status: Never Used  Substance and Sexual Activity   Alcohol use: Yes    Alcohol/week: 4.0 standard drinks of alcohol    Types: 4 Cans of beer per week    Comment: 1-2 beers once twice a week   Drug use: No   Sexual activity: Not Currently    Comment: 1st intercourse- 19, partners- 2, widow     Tobacco Counseling Counseling given: Not Answered  SDOH Screenings   Food Insecurity: No Food Insecurity (05/09/2024)  Housing: Low Risk  (05/09/2024)  Transportation Needs: No Transportation Needs (05/09/2024)  Utilities: Not At Risk (05/09/2024)  Alcohol Screen: Low Risk  (05/09/2024)  Depression (PHQ2-9): Low Risk  (05/09/2024)  Financial Resource Strain: Low Risk  (05/09/2024)  Physical Activity: Inactive (05/09/2024)  Social Connections: Moderately Integrated (05/09/2024)  Stress: No Stress Concern Present (05/09/2024)  Recent Concern: Stress - Stress Concern Present (04/14/2024)  Tobacco Use: Low Risk  (05/09/2024)  Health Literacy: Adequate Health Literacy (05/09/2024)   Depression Screen    05/09/2024    9:36 AM 04/15/2024    2:11 PM 09/26/2023   11:44 AM 05/03/2023    4:13 PM 08/31/2022    4:21 PM 04/13/2022    7:14 AM 11/21/2021   10:37 AM  PHQ 2/9 Scores  PHQ - 2 Score 0 0 0 0 0 0 0  PHQ- 9 Score 1 0  0  0  0        Data saved with a previous flowsheet row definition      Goals Addressed             This Visit's Progress    Patient Stated  Wants to lose about 15 pounds       Visit info / Clinical Intake: Medicare Wellness Visit Type:: Subsequent Annual Wellness Visit Medicare Wellness Visit Mode:: Telephone If telephone:: video declined If telephone or video:: pt reported vitals Interpreter Needed?: No Pre-visit prep was completed: yes AWV questionnaire completed by patient prior to visit?: no Living arrangements:: (!) lives alone Patient's Overall Health Status Rating: good Typical amount of pain: some Does pain affect daily life?: no Are you currently prescribed opioids?: no  Dietary Habits and Nutritional Risks How many meals a day?: 2 Eats fruit and vegetables daily?: (!) no Most meals are obtained by: eating out Diabetic:: no  Functional Status Activities of Daily Living (to include ambulation/medication): Independent Ambulation:  Independent Medication Administration: Independent Home Management: Independent Manage your own finances?: yes Primary transportation is: driving Concerns about vision?: (!) yes Concerns about hearing?: no  Fall Screening Falls in the past year?: 1 Number of falls in past year: 0 Was there an injury with Fall?: 1 Fall Risk Category Calculator: 2 Patient Fall Risk Level: Moderate Fall Risk  Fall Risk Patient at Risk for Falls Due to: History of fall(s); Other (Comment) (fell over a car jack) Fall risk Follow up: Falls evaluation completed; Falls prevention discussed  Home and Transportation Safety: All rugs have non-skid backing?: yes All stairs or steps have railings?: yes Grab bars in the bathtub or shower?: (!) no Have non-skid surface in bathtub or shower?: (!) no Good home lighting?: yes Regular seat belt use?: yes Hospital stays in the last year:: no  Cognitive Assessment Difficulty concentrating, remembering, or making decisions? : yes Will 6CIT or Mini Cog be Completed: yes What year is it?: 0 points What month is it?: 0 points Give patient an address phrase to remember (5 components): [address] [place] About what time is it?: 0 points Count backwards from 20 to 1: 0 points Say the months of the year in reverse: 0 points Repeat the address phrase from earlier: 0 points 6 CIT Score: 0 points  Advance Directives (For Healthcare) Does Patient Have a Medical Advance Directive?: No Would patient like information on creating a medical advance directive?: No - Patient declined  Reviewed/Updated  Reviewed/Updated: All        Objective:    Today's Vitals   05/09/24 0925  Weight: 177 lb 4 oz (80.4 kg)  Height: 5' 4 (1.626 m)   Body mass index is 30.42 kg/m.  Current Medications (verified) Outpatient Encounter Medications as of 05/09/2024  Medication Sig   ALPRAZolam  (XANAX ) 0.25 MG tablet Take 1 tablet (0.25 mg total) by mouth daily as needed for  anxiety.   famotidine  (PEPCID ) 20 MG tablet Take 1 tablet (20 mg total) by mouth daily before supper.   Multiple Vitamins-Minerals (MULTIVITAMIN PO) Take by mouth daily.   No facility-administered encounter medications on file as of 05/09/2024.   Hearing/Vision screen Hearing Screening - Comments:: No issues Vision Screening - Comments:: May have dry eyes, has appointment scheduled [facility] Immunizations and Health Maintenance Health Maintenance  Topic Date Due   Pneumococcal Vaccine: 50+ Years (1 of 2 - PCV) Never done   Zoster Vaccines- Shingrix (1 of 2) Never done   COVID-19 Vaccine (3 - Pfizer risk series) 10/14/2019   Influenza Vaccine  09/30/2024 (Originally 02/01/2024)   DEXA SCAN  06/22/2024   DTaP/Tdap/Td (2 - Td or Tdap) 06/23/2024   Colonoscopy  10/16/2024   Mammogram  12/25/2024   Medicare Annual Wellness (  AWV)  05/09/2025   Hepatitis C Screening  Completed   Meningococcal B Vaccine  Aged Out        Assessment/Plan:  This is a routine wellness examination for Lori Guerrero.   Patient Care Team: Glendia Shad, MD as PCP - General (Internal Medicine) Ashley Soulier, DPM as Referring Physician [facility]) [facility], [facility] ([facility]ermat[facility], [facility] ([facility]) Milissa Hamming, MD as Referring Physician (Otolaryngology)  I have personally reviewed and noted the following in the patient's chart:   Medical and social history Use of alcohol, tobacco or illicit drugs  Current medications and supplements including opioid prescriptions. Functional ability and status Nutritional status Physical activity Advanced directives List of other physicians Hospitalizations, surgeries, and ER visits in previous 12 months Vitals Screenings to include cognitive, depression, and falls Referrals and appointments  No orders of the defined types were placed in this encounter.  In addition, I have reviewed and discussed with patient certain preventive  protocols, quality metrics, and best practice recommendations. A written personalized care plan for preventive services as well as general preventive health recommendations were provided to patient.   Angeline Fredericks, LPN   [id number]/[id number]   Return in 1 year (on 05/09/2025).  After Visit Summary: (MyChart) Due to this being a telephonic visit, the after visit summary with patients personalized plan was offered to patient via MyChart   Nurse Notes: Order placed for Dexa Scan. Patient declines all vaccines.

## 2024-05-09 NOTE — Patient Instructions (Addendum)
 Lori Guerrero,  Thank you for taking the time for your Medicare Wellness Visit. I appreciate your continued commitment to your health goals. Please review the care plan we discussed, and feel free to reach out if I can assist you further.  Please note that Annual Wellness Visits do not include a physical exam. Some assessments may be limited, especially if the visit was conducted virtually. If needed, we may recommend an in-person follow-up with your provider.  Ongoing Care Seeing your primary care provider every 3 to 6 months helps us  monitor your health and provide consistent, personalized care.   You have an order for:  []   2D Mammogram  []   3D Mammogram  [x]   Bone Density     Please call for appointment:  [facility]  [address] [place] [address] [phone]     Make sure to wear two-piece clothing.  No lotions, powders, or deodorants the day of the appointment. Make sure to bring picture ID and insurance card.  Bring list of medications you are currently taking including any supplements.    Referrals If a referral was made during today's visit and you haven't received any updates within two weeks, please contact the referred provider directly to check on the status.  Recommended Screenings:  Health Maintenance  Topic Date Due   Pneumococcal Vaccine for age over 51 (1 of 2 - PCV) Never done   Zoster (Shingles) Vaccine (1 of 2) Never done   COVID-19 Vaccine (3 - Pfizer risk series) 10/14/2019   Flu Shot  09/30/2024*   DEXA scan (bone density measurement)  06/22/2024   DTaP/Tdap/Td vaccine (2 - Td or Tdap) 06/23/2024   Colon Cancer Screening  10/16/2024   Breast Cancer Screening  12/25/2024   Medicare Annual Wellness Visit  05/09/2025   Hepatitis C Screening  Completed   Meningitis B Vaccine  Aged Out  *Topic was postponed. The date shown is not the original due date.       05/09/2024    9:28 AM  Advanced  Directives  Does Patient Have a Medical Advance Directive? No  Would patient like information on creating a medical advance directive? No - Patient declined    Vision: Annual vision screenings are recommended for early detection of glaucoma, cataracts, and diabetic retinopathy. These exams can also reveal signs of chronic conditions such as diabetes and high blood pressure.  Dental: Annual dental screenings help detect early signs of oral cancer, gum disease, and other conditions linked to overall health, including heart disease and diabetes.  Please see the attached documents for additional preventive care recommendations.

## 2024-07-04 ENCOUNTER — Ambulatory Visit: Payer: Self-pay

## 2024-07-04 ENCOUNTER — Ambulatory Visit (INDEPENDENT_AMBULATORY_CARE_PROVIDER_SITE_OTHER): Admitting: Nurse Practitioner

## 2024-07-04 VITALS — BP 124/64 | HR 80 | Temp 98.3°F | Ht 64.0 in | Wt 181.2 lb

## 2024-07-04 DIAGNOSIS — R051 Acute cough: Secondary | ICD-10-CM

## 2024-07-04 DIAGNOSIS — J101 Influenza due to other identified influenza virus with other respiratory manifestations: Secondary | ICD-10-CM | POA: Insufficient documentation

## 2024-07-04 DIAGNOSIS — R062 Wheezing: Secondary | ICD-10-CM | POA: Diagnosis not present

## 2024-07-04 DIAGNOSIS — G4489 Other headache syndrome: Secondary | ICD-10-CM | POA: Diagnosis not present

## 2024-07-04 MED ORDER — GUAIFENESIN-CODEINE 100-10 MG/5ML PO SOLN: 5.0000 mL | Freq: Three times a day (TID) | ORAL | 0 refills | Status: DC | PRN

## 2024-07-04 MED ORDER — ALBUTEROL SULFATE HFA 108 (90 BASE) MCG/ACT IN AERS: 2.0000 | INHALATION_SPRAY | RESPIRATORY_TRACT | 0 refills | Status: AC | PRN

## 2024-07-04 NOTE — Patient Instructions (Signed)
 Nice to see you today  I have sent in cough medication that can make you sleepy so use caution  Rest, drink plenty of fluids Follow up if you do not continue to improve

## 2024-07-04 NOTE — Progress Notes (Signed)
 "  Established Patient Office Visit  Subjective   Patient ID: Lori Guerrero, female    DOB: Oct 16, 1950  Age: 74 y.o. MRN: [id number]  Chief Complaint  Patient presents with   Cough    Pt complains of cough and SOB, mild headache, sneezing, runny nose that started Sunday. No body aches or chills.      With a history of chornic rhinitis, GERD, goiter, DOE,  Discussed the use of AI scribe software for clinical note transcription with the patient, who gave verbal consent to proceed.  History of Present Illness Lori Guerrero is a 74 year old female who presents with flu-like symptoms.  Symptoms began on Sunday following exposure to sick family members on Christmas Eve and Christmas Day. She experiences rhinorrhea, cough, and occasional chills. No fever is reported, but she feels fatigued, attributing it to poor sleep due to coughing. The cough worsens when lying down, especially on her left side, and is described as 'violent'.  She has been using over-the-counter medications such as Sudafed and Benadryl, which have provided some relief. Her nasal discharge was significant yesterday, but the medications have helped reduce the severity of her cough. She describes a sensation in her chest that she can 'feel moving around' when she coughs.  She denies sore throat except when coughing violently, which irritates her throat. No ear pain or fullness is present. She visited an ENT on Christmas Eve for a sore in her nose and was treated with doxycycline and a topical cream.  She denies nausea, vomiting, diarrhea, or abdominal pain, though violent coughing makes her feel like she might vomit. Occasional headaches occur, but no myalgias are reported, except for some pain on her side.  Her current medications include alprazolam , a multivitamin, and Pepcid . She has not been using alprazolam  frequently, only taking a small dose when dealing with family stress. She has completed a course of  doxycycline recently.    Review of Systems  Constitutional:  Positive for chills and malaise/fatigue. Negative for fever.  HENT:  Negative for ear discharge, ear pain, sinus pain and sore throat.   Respiratory:  Positive for cough and sputum production.   Gastrointestinal:  Negative for abdominal pain, diarrhea, nausea and vomiting.  Musculoskeletal:  Positive for myalgias.  Neurological:  Positive for headaches.      Objective:     BP 124/64   Pulse 80   Temp 98.3 F (36.8 C) (Oral)   Ht 5' 4 (1.626 m)   Wt 181 lb 3.2 oz (82.2 kg)   SpO2 96%   BMI 31.10 kg/m  BP Readings from Last 3 Encounters:  07/04/24 124/64  04/21/24 112/64  01/31/24 130/70   Wt Readings from Last 3 Encounters:  07/04/24 181 lb 3.2 oz (82.2 kg)  05/09/24 177 lb 4 oz (80.4 kg)  04/15/24 182 lb 4 oz (82.7 kg)   SpO2 Readings from Last 3 Encounters:  07/04/24 96%  04/15/24 97%  01/31/24 98%      Physical Exam Vitals and nursing note reviewed.  Constitutional:      Appearance: Normal appearance.  HENT:     Right Ear: Tympanic membrane, ear canal and external ear normal.     Left Ear: Tympanic membrane, ear canal and external ear normal.     Mouth/Throat:     Mouth: Mucous membranes are moist.     Pharynx: Oropharynx is clear.  Cardiovascular:     Rate and Rhythm: Normal rate and regular rhythm.  Heart sounds: Normal heart sounds.  Pulmonary:     Effort: Pulmonary effort is normal.     Breath sounds: Normal breath sounds.  Lymphadenopathy:     Cervical: No cervical adenopathy.  Neurological:     Mental Status: She is alert.      No results found for any visits on 07/04/24.    The 10-year ASCVD risk score (Arnett DK, et al., 2019) is: 11%    Assessment & Plan:   Problem List Items Addressed This Visit       Respiratory   Influenza A     Other   Cough - Primary   Relevant Medications   guaiFENesin-codeine 100-10 MG/5ML syrup   Wheezing   Relevant Medications    albuterol  (VENTOLIN  HFA) 108 (90 Base) MCG/ACT inhaler   Other headache syndrome   Assessment and Plan Assessment & Plan Influenza A with respiratory manifestations Influenza A confirmed by rapid test. Symptoms include rhinorrhea, productive cough, chills, fatigue, and dyspnea. No fever. Differential diagnosis includes pneumonia, but recent doxycycline likely covers bacterial causes. Tamiflu discussed but not prescribed. - Prescribed cough medicine up to three times a day as needed, caution for sedation. - Prescribed albuterol  inhaler for wheezing or dyspnea, caution for jitteriness. - Advised rest, hydration, acetaminophen for myalgia or fever. - Continue Sudafed if effective, discontinue if not. - Advised to stay home through the weekend to prevent contagion.  Return if symptoms worsen or fail to improve.    Adina Crandall, NP  "

## 2024-07-04 NOTE — Telephone Encounter (Signed)
 FYI Only or Action Required?: FYI only for provider: appointment scheduled on 07/04/24.  Patient was last seen in primary care on 04/15/2024 by Glendia Shad, MD.  Called Nurse Triage reporting Cough.  Symptoms began several days ago.  Interventions attempted: OTC medications: Delysm, Vicks.  Symptoms are: gradually worsening.  Triage Disposition: See Physician Within 24 Hours  Patient/caregiver understands and will follow disposition?: Yes    Sunday onset of severe productive cough with white mucus, sneezing, headache, clear drainage from nose with lethargy. No blood. O2 sats 97%, HR in the 70's. Some wheezing, pt isn't sure bc she has an extra hole in her nose. Denies SOB. No fever. Scheduled appt with provider at different office in pt region today d/t no availability at home office with any provider within timeframe. Advised UC or ED for worsening symptoms.     Copied from CRM [id number]. Topic: Clinical - Red Word Triage >> Jul 04, 2024 12:11 PM Harlene ORN wrote: Red Word that prompted transfer to Nurse Triage: been sick since sunday; coughing so severe it causes nausea, no fever. Headache and some dizziness. Wheezing in between coughing. Reason for Disposition  SEVERE coughing spells (e.g., whooping sound after coughing, vomiting after coughing)  Answer Assessment - Initial Assessment Questions 1. ONSET: When did the cough begin?      Sunday  2. SEVERITY: How bad is the cough today?      Severe  3. SPUTUM: Describe the color of your sputum (e.g., none, dry cough; clear, white, yellow, green)     White  4. HEMOPTYSIS: Are you coughing up any blood? If Yes, ask: How much? (e.g., flecks, streaks, tablespoons, etc.)     Denies  5. DIFFICULTY BREATHING: Are you having difficulty breathing? If Yes, ask: How bad is it? (e.g., mild, moderate, severe)      Denies  6. FEVER: Do you have a fever? If Yes, ask: What is your temperature, how was it measured, and when  did it start?     Denies  7. CARDIAC HISTORY: Do you have any history of heart disease? (e.g., heart attack, congestive heart failure)      Denies  8. LUNG HISTORY: Do you have any history of lung disease?  (e.g., pulmonary embolus, asthma, emphysema)     Denies  9. PE RISK FACTORS: Do you have a history of blood clots? (or: recent major surgery, recent prolonged travel, bedridden)     Denies  10. OTHER SYMPTOMS: Do you have any other symptoms? (e.g., runny nose, wheezing, chest pain)       Sneezing, clear drainage from nose with lethargy. Some mild wheezing.   12. TRAVEL: Have you traveled out of the country in the last month? (e.g., travel history, exposures)       *No Answer*  Protocols used: Cough - Acute Productive-A-AH

## 2024-07-08 ENCOUNTER — Encounter: Payer: Self-pay | Admitting: Nurse Practitioner

## 2024-07-11 ENCOUNTER — Ambulatory Visit: Payer: Self-pay

## 2024-07-11 LAB — POCT INFLUENZA A/B
Influenza A, POC: POSITIVE — AB
Influenza B, POC: NEGATIVE

## 2024-07-11 NOTE — Telephone Encounter (Signed)
 FYI Only or Action Required?: Action required by provider: request for appointment and clinical question for provider.  Patient was last seen in primary care on 07/04/2024 by Wendee Lynwood HERO, NP.  Called Nurse Triage reporting Cough.  Symptoms began several weeks ago.  Interventions attempted: Prescription medications: Cough medicine.  Symptoms are: unchanged.  Triage Disposition: See HCP Within 4 Hours (Or PCP Triage)  Patient/caregiver understands and will follow disposition?:  Reason for Disposition  Wheezing is present  Answer Assessment - Initial Assessment Questions Seen 07/04/24 at [facility] because was wheezing, tested positive for Flu A.Given Albuterol  inhaler and guaiFENesin -codeine  100-10 MG/5ML syrup. Pt has not used the albuterol  inhaler. Educated patient on what and Albuterol  inhaler is and the benefits of use. No available appointments in office. Patient is wondering if she needs to be seen again or if something else needs to be called in to pharmacy. Advised patient to use Inhaler in the mean time. Requesting call back [phone]  1. ONSET: When did the cough begin?      A week  2. SEVERITY: How bad is the cough today?      Worsening  3. SPUTUM: Describe the color of your sputum (e.g., none, dry cough; clear, white, yellow, green)     Clear, sticky   4. HEMOPTYSIS: Are you coughing up any blood? If Yes, ask: How much? (e.g., flecks, streaks, tablespoons, etc.)     Denies  5. DIFFICULTY BREATHING: Are you having difficulty breathing? If Yes, ask: How bad is it? (e.g., mild, moderate, severe)      Denies  6. FEVER: Do you have a fever? If Yes, ask: What is your temperature, how was it measured, and when did it start?     100.4 yesterday forehead  7. CARDIAC HISTORY: Do you have any history of heart disease? (e.g., heart attack, congestive heart failure)      Denies  8. LUNG HISTORY: Do you have any history of lung disease?  (e.g.,  pulmonary embolus, asthma, emphysema)     Denies  9. OTHER SYMPTOMS: Do you have any other symptoms? (e.g., runny nose, wheezing, chest pain)       Wheezing, cannot lay on left side or will cough, rattling, chest congestion, dizzy.  Protocols used: Cough - Acute Productive-A-AH  Copied from CRM #[id number]. Topic: Clinical - Red Word Triage >> Jul 11, 2024  1:49 PM Tiffini S wrote: Red Word that prompted transfer to Nurse Triage: cough with pain and wheezing sound of left side of lung, dizziness- started three days ago

## 2024-07-11 NOTE — Addendum Note (Signed)
 Addended by: SEBASTIAN SHU on: 07/11/2024 09:02 AM   Modules accepted: Orders

## 2024-07-12 NOTE — Telephone Encounter (Signed)
 Called Lori Guerrero for update to see how she is doing. She is now using the inhaler. Feeling better. Still with some congestion. Not taking cough syrup - intolerance to the codeine . Will start robitussin DM. Continue inhaler. Please follow up with her and confirm she is continuing to do better.

## 2024-07-14 ENCOUNTER — Ambulatory Visit: Payer: Self-pay | Admitting: General Practice

## 2024-07-14 ENCOUNTER — Ambulatory Visit (INDEPENDENT_AMBULATORY_CARE_PROVIDER_SITE_OTHER)
Admission: RE | Admit: 2024-07-14 | Discharge: 2024-07-14 | Disposition: A | Source: Ambulatory Visit | Attending: General Practice | Admitting: General Practice

## 2024-07-14 ENCOUNTER — Encounter: Payer: Self-pay | Admitting: General Practice

## 2024-07-14 ENCOUNTER — Ambulatory Visit: Admitting: General Practice

## 2024-07-14 VITALS — BP 118/82 | HR 71 | Temp 97.5°F | Ht 64.0 in | Wt 179.0 lb

## 2024-07-14 DIAGNOSIS — R0981 Nasal congestion: Secondary | ICD-10-CM

## 2024-07-14 DIAGNOSIS — R051 Acute cough: Secondary | ICD-10-CM | POA: Diagnosis not present

## 2024-07-14 DIAGNOSIS — J209 Acute bronchitis, unspecified: Secondary | ICD-10-CM | POA: Diagnosis not present

## 2024-07-14 MED ORDER — PREDNISONE 20 MG PO TABS: 40.0000 mg | ORAL_TABLET | Freq: Every day | ORAL | 0 refills | Status: AC

## 2024-07-14 MED ORDER — FLUTICASONE PROPIONATE 50 MCG/ACT NA SUSP: 2.0000 | Freq: Every day | NASAL | 0 refills | Status: DC

## 2024-07-14 MED ORDER — BENZONATATE 200 MG PO CAPS: 200.0000 mg | ORAL_CAPSULE | Freq: Three times a day (TID) | ORAL | 0 refills | Status: AC | PRN

## 2024-07-14 MED ORDER — AZITHROMYCIN 250 MG PO TABS: ORAL_TABLET | ORAL | 0 refills | Status: AC

## 2024-07-14 NOTE — Progress Notes (Signed)
 "  Established Patient Office Visit  Subjective   Patient ID: Lori Guerrero, female    DOB: October 03, 1950  Age: 74 y.o. MRN: [id number]  Chief Complaint  Patient presents with   Cough    Patient here to follow up on continued cough; patient saw Adina Crandall, NP on 07/04/24. Patient has been on inhaler, cough syrup, and robitussin DM. Patient has been using the inhaler and has helped; did not take the prescribed cough syrup from [facility] but once or twice as it made her feel weird. Patient has only been taking the robitussin DM now.     Cough Associated symptoms include headaches and wheezing. Pertinent negatives include no chest pain, chills, ear pain, fever, heartburn, sore throat or shortness of breath.    Lori Guerrero is a 74 year old female, patient of Allena Hamilton, MD, presents today for an acute visit.   Discussed the use of AI scribe software for clinical note transcription with the patient, who gave verbal consent to proceed.  History of Present Illness Lori Guerrero is a 75 year old female who presents with persistent cough and dizziness following a recent flu diagnosis.  She has experienced a persistent cough since December 28th, characterized by a 'rattle' in her chest and productive with clear sputum. She uses an albuterol  inhaler every four hours as needed. She also reports wheezing, described as 'crazy', more pronounced on the left side. She has been using Robitussin with DM for her cough, as other cough syrups made her feel 'weird'.  She was diagnosed with Flu A on January 2nd and initially experienced symptoms including runny nose, productive cough, chills, fatigue, and difficulty breathing. Most of these symptoms have improved, but she continues to experience nasal congestion and ear popping without pain. She also reports dizziness, which started around the same time as her other symptoms, and headaches. She mentions having epistaxis, which she attributes to  irritation.  She has experienced diarrhea since her flu diagnosis, which has improved but persists. She reports a decrease in appetite and has not been eating much. She also reports stomach pain. No urinary symptoms such as urgency or burning.  She has a history of secondhand smoke exposure due to her late husband's smoking, but she has never smoked herself. She was previously tested for asthma but was told she did not have it.  She denies having received a flu shot or any other vaccinations this season.    Patient Active Problem List   Diagnosis Date Noted   Influenza A 07/04/2024   Other headache syndrome 07/04/2024   Left foot pain 01/31/2024   Multinodular goiter 05/27/2023   Hot flashes 05/23/2023   Left knee pain 01/21/2023   Chronic rhinitis 01/08/2023   Nausea 09/20/2022   Hypercholesterolemia 04/13/2022   Wheezing 04/13/2022   Hip pain, right 11/21/2021   Nasal lesion 08/29/2021   Tail bone pain 08/07/2021   Foot pain, right 05/02/2021   Cough 05/18/2020   Toe pain 11/16/2019   SOB (shortness of breath) on exertion 10/18/2019   Dizziness 10/18/2019   Back pain 06/30/2019   Skin lesions 04/23/2019   Right upper quadrant abdominal pain 05/22/2018   Nasal congestion 08/04/2017   Stress 04/05/2015   Health care maintenance 04/05/2015   Abdominal pain 04/14/2013   Hyperglycemia 08/13/2012   GERD (gastroesophageal reflux disease) 08/13/2012   History of colonic polyps 08/13/2012   Vitamin D  deficiency 08/13/2012   Skin cancer 08/13/2012   Past Medical History:  Diagnosis Date   GERD (gastroesophageal reflux disease)    Hyperglycemia    Hyperplastic colon polyp    Skin cancer    Vitamin D  deficiency    Past Surgical History:  Procedure Laterality Date   BREAST BIOPSY     25 years ago   MOHS SURGERY     skin cancer   Allergies[1]       07/04/2024    2:18 PM 05/09/2024    9:36 AM 04/15/2024    2:11 PM  Depression screen PHQ 2/9  Decreased Interest 0 0 0   Down, Depressed, Hopeless 0 0 0  PHQ - 2 Score 0 0 0  Altered sleeping 0 1 0  Tired, decreased energy 0 0 0  Change in appetite 0 0 0  Feeling bad or failure about yourself  0 0 0  Trouble concentrating 0 0 0  Moving slowly or fidgety/restless 0 0 0  Suicidal thoughts 0 0 0  PHQ-9 Score 0 1 0   Difficult doing work/chores Not difficult at all Not difficult at all Not difficult at all     Data saved with a previous flowsheet row definition       07/04/2024    2:18 PM 04/15/2024    2:12 PM 09/26/2023   11:43 AM 08/31/2022    4:21 PM  GAD 7 : Generalized Anxiety Score  Nervous, Anxious, on Edge 0 0 0 0  Control/stop worrying 0 0 0 0  Worry too much - different things 0 0 0 0  Trouble relaxing 0 0 0 0  Restless 0 0 0 0  Easily annoyed or irritable 0 0 0 0  Afraid - awful might happen 0 0 0 0  Total GAD 7 Score 0 0 0 0  Anxiety Difficulty Not difficult at all Not difficult at all  Not difficult at all      Review of Systems  Constitutional:  Negative for chills, fever and malaise/fatigue.  HENT:  Positive for congestion. Negative for ear pain, sinus pain and sore throat.   Respiratory:  Positive for cough, sputum production and wheezing. Negative for shortness of breath.   Cardiovascular:  Negative for chest pain.  Gastrointestinal:  Negative for abdominal pain, constipation, diarrhea, heartburn, nausea and vomiting.  Genitourinary:  Negative for dysuria, frequency and urgency.  Neurological:  Positive for dizziness and headaches.  Endo/Heme/Allergies:  Negative for polydipsia.  Psychiatric/Behavioral:  Negative for depression and suicidal ideas. The patient is not nervous/anxious.       Objective:     BP 118/82   Pulse 71   Temp (!) 97.5 F (36.4 C) (Temporal)   Ht 5' 4 (1.626 m)   Wt 179 lb (81.2 kg)   SpO2 97%   BMI 30.73 kg/m  BP Readings from Last 3 Encounters:  07/14/24 118/82  07/04/24 124/64  04/21/24 112/64   Wt Readings from Last 3 Encounters:   07/14/24 179 lb (81.2 kg)  07/04/24 181 lb 3.2 oz (82.2 kg)  05/09/24 177 lb 4 oz (80.4 kg)      Physical Exam Vitals and nursing note reviewed.  Constitutional:      Appearance: Normal appearance.  HENT:     Right Ear: Tympanic membrane, ear canal and external ear normal.     Left Ear: Tympanic membrane, ear canal and external ear normal.     Nose: Congestion present.     Mouth/Throat:     Pharynx: Oropharynx is clear.  Eyes:  Conjunctiva/sclera: Conjunctivae normal.  Cardiovascular:     Rate and Rhythm: Normal rate and regular rhythm.     Pulses: Normal pulses.     Heart sounds: Normal heart sounds.  Pulmonary:     Effort: Pulmonary effort is normal. No respiratory distress.     Breath sounds: No stridor. Wheezing present. No rhonchi or rales.  Chest:     Chest wall: No tenderness.  Neurological:     Mental Status: She is alert and oriented to person, place, and time.  Psychiatric:        Mood and Affect: Mood normal.        Behavior: Behavior normal.        Thought Content: Thought content normal.        Judgment: Judgment normal.      No results found for any visits on 07/14/24.     The 10-year ASCVD risk score (Arnett DK, et al., 2019) is: 10%    Assessment & Plan:  Acute cough -     DG Chest 2 View -     Benzonatate ; Take 1 capsule (200 mg total) by mouth 3 (three) times daily as needed.  Dispense: 20 capsule; Refill: 0 -     predniSONE ; Take 2 tablets (40 mg total) by mouth daily for 5 days.  Dispense: 10 tablet; Refill: 0  Nasal congestion -     Fluticasone  Propionate; Place 2 sprays into both nostrils daily.  Dispense: 16 g; Refill: 0  Acute bronchitis, unspecified organism -     Azithromycin ; Take 2 tablets on day 1, then 1 tablet daily on days 2 through 5  Dispense: 6 tablet; Refill: 0    Assessment and Plan Assessment & Plan Acute bronchitis Persistent cough and wheezing since December 28th, initially diagnosed as flu A. Differential  includes bronchitis and possible pneumonia. Wheezing more pronounced on the left side. Chest x-ray pending to rule out pneumonia. - Ordered chest x-ray to evaluate for pneumonia or bronchitis. - Continue albuterol  inhaler every four hours as needed. - Prescribed Tessalon  Perles for cough. - Prescribed prednisone , two tablets in the morning for five days. - Prescribed azithromycin  (Z-Pak), two tablets today and one tablet daily thereafter with food. - Prescribed Flonase  for nasal congestion and ear popping. - Advised follow-up with Doctor Glendia if symptoms do not improve. - Instructed to go to the ER if experiencing continuous shortness of breath or difficulty breathing.   Return if symptoms worsen or fail to improve.    Carrol Aurora, NP     [1] No Known Allergies  "

## 2024-07-14 NOTE — Patient Instructions (Addendum)
 Complete xray(s) prior to leaving today. I will notify you of your results once received.  Continue albuterol  every 4 hours as needed.   Continue robitussin DM.   Nasal Congestion/Ear Pressure/Sinus Pressure: Try using Flonase  (fluticasone ) nasal spray. Instill 1 spray in each nostril twice daily.    Start Benzonatate  capsules for cough. Take 1 capsule by mouth three times daily as needed for cough.   Start prednisone  20 mg tablets. Take 2 tablets by mouth once daily in the morning for 5 days.   Start Azithromycin  antibiotics for infection. Take 2 tablets by mouth today, then 1 tablet daily for 4 additional days.   Follow up with Dr. Glendia if your symptoms do not improve.   It was a pleasure to see you today!

## 2024-07-14 NOTE — Telephone Encounter (Signed)
 Lvm for pt to return call.called pt to see how she was doing. Okay to relay

## 2024-07-18 ENCOUNTER — Ambulatory Visit: Admitting: Internal Medicine

## 2024-07-23 ENCOUNTER — Ambulatory Visit (INDEPENDENT_AMBULATORY_CARE_PROVIDER_SITE_OTHER): Admitting: Internal Medicine

## 2024-07-23 ENCOUNTER — Encounter: Payer: Self-pay | Admitting: Internal Medicine

## 2024-07-23 VITALS — BP 140/86 | HR 62 | Temp 98.0°F | Ht 64.0 in | Wt 182.8 lb

## 2024-07-23 DIAGNOSIS — R059 Cough, unspecified: Secondary | ICD-10-CM

## 2024-07-23 DIAGNOSIS — Z860100 Personal history of colon polyps, unspecified: Secondary | ICD-10-CM | POA: Diagnosis not present

## 2024-07-23 DIAGNOSIS — M25521 Pain in right elbow: Secondary | ICD-10-CM | POA: Diagnosis not present

## 2024-07-23 DIAGNOSIS — Z780 Asymptomatic menopausal state: Secondary | ICD-10-CM | POA: Diagnosis not present

## 2024-07-23 DIAGNOSIS — K219 Gastro-esophageal reflux disease without esophagitis: Secondary | ICD-10-CM | POA: Diagnosis not present

## 2024-07-23 DIAGNOSIS — E7800 Pure hypercholesterolemia, unspecified: Secondary | ICD-10-CM

## 2024-07-23 DIAGNOSIS — F439 Reaction to severe stress, unspecified: Secondary | ICD-10-CM

## 2024-07-23 DIAGNOSIS — E042 Nontoxic multinodular goiter: Secondary | ICD-10-CM | POA: Diagnosis not present

## 2024-07-23 DIAGNOSIS — R739 Hyperglycemia, unspecified: Secondary | ICD-10-CM | POA: Diagnosis not present

## 2024-07-23 MED ORDER — FAMOTIDINE 20 MG PO TABS: 20.0000 mg | ORAL_TABLET | Freq: Every day | ORAL | 1 refills | Status: AC

## 2024-07-23 MED ORDER — AZELASTINE HCL 0.1 % NA SOLN: 1.0000 | Freq: Two times a day (BID) | NASAL | 0 refills | Status: AC

## 2024-07-23 NOTE — Progress Notes (Signed)
 "  Subjective:    Patient ID: Lori Guerrero, female    DOB: 06-03-1951, 74 y.o.   MRN: [id number]  Patient here for  Chief Complaint  Patient presents with   Medical Management of Chronic Issues    HPI Here for a scheduled follow up - recently evaluated by ST - 03/10/24 - intact oropharyngeal swallow function. Recommended to continue regular diet with thin liquids with standard aspiration/reflux precautions. Recently evaluated - cough. Diagnosed with influenza A 07/04/24. Persistent cough - prescribed tessalon  perles and prednisone , zpak and flonase . CXR - ok. Feeling better. Still with some increased drainage and occasional cough. No sore throat. Previous - wheeze - occasional flutter/congestion in chest. Feels from a cardiac standpoint - stable. Discussed - using flonase . No sob reported. No acid reflux reported. Does report - right elbow pain. Increased pulling. Worsened the last few days.    Past Medical History:  Diagnosis Date   GERD (gastroesophageal reflux disease)    Hyperglycemia    Hyperplastic colon polyp    Skin cancer    Vitamin D  deficiency    Past Surgical History:  Procedure Laterality Date   BREAST BIOPSY     25 years ago   EYE SURGERY  eyelid 2010   MOHS SURGERY     skin cancer   Family History  Problem Relation Age of Onset   Heart disease Mother        irregular heart beat   Diabetes Mother    Other Father        Kevon Eriksson Disease (CJD)   Cancer Father    Biliary Cirrhosis Sister    Breast cancer Maternal Aunt    Arthritis Maternal Aunt    Breast cancer Paternal Aunt 5   Cancer Paternal Aunt    Diabetes Maternal Grandmother    Heart disease Maternal Grandmother    Varicose Veins Maternal Grandmother    Cancer Paternal Grandfather        colon   Cancer Paternal Grandmother    Kidney disease Paternal Grandmother    Birth defects Sister    Heart disease Sister    Social History   Socioeconomic History   Marital status: Widowed    Spouse  name: Not on file   Number of children: 1   Years of education: Not on file   Highest education level: Some college, no degree  Occupational History   Occupation: retired  Tobacco Use   Smoking status: Never    Passive exposure: Past (Husband did smoke.)   Smokeless tobacco: Never  Vaping Use   Vaping status: Never Used  Substance and Sexual Activity   Alcohol use: Yes    Alcohol/week: 4.0 standard drinks of alcohol    Types: 4 Cans of beer per week    Comment: 1-2 beers once twice a week   Drug use: No   Sexual activity: Not Currently    Comment: 1st intercourse- 19, partners- 2, widow   Other Topics Concern   Not on file  Social History Narrative   Not on file   Social Drivers of Health   Tobacco Use: Low Risk (07/27/2024)   Patient History    Smoking Tobacco Use: Never    Smokeless Tobacco Use: Never    Passive Exposure: Past  Financial Resource Strain: Low Risk (05/09/2024)   Overall Financial Resource Strain (CARDIA)    Difficulty of Paying Living Expenses: Not hard at all  Food Insecurity: No Food Insecurity (05/09/2024)   Epic  Worried About Programme Researcher, Broadcasting/film/video in the Last Year: Never true    Ran Out of Food in the Last Year: Never true  Transportation Needs: No Transportation Needs (05/09/2024)   Epic    Lack of Transportation (Medical): No    Lack of Transportation (Non-Medical): No  Physical Activity: Inactive (05/09/2024)   Exercise Vital Sign    Days of Exercise per Week: 0 days    Minutes of Exercise per Session: 0 min  Stress: No Stress Concern Present (05/09/2024)   [employer] of Occupational Health - Occupational Stress Questionnaire    Feeling of Stress: Only a little  Recent Concern: Stress - Stress Concern Present (04/14/2024)   [employer] of Occupational Health - Occupational Stress Questionnaire    Feeling of Stress: To some extent  Social Connections: Moderately Integrated (05/09/2024)   Social Connection and Isolation Panel     Frequency of Communication with Friends and Family: More than three times a week    Frequency of Social Gatherings with Friends and Family: More than three times a week    Attends Religious Services: More than 4 times per year    Active Member of [employer] or Organizations: Yes    Attends Banker Meetings: More than 4 times per year    Marital Status: Widowed  Depression (PHQ2-9): Low Risk (07/04/2024)   Depression (PHQ2-9)    PHQ-2 Score: 0  Alcohol Screen: Low Risk (05/09/2024)   Alcohol Screen    Last Alcohol Screening Score (AUDIT): 3  Housing: Low Risk (05/09/2024)   Epic    Unable to Pay for Housing in the Last Year: No    Number of Times Moved in the Last Year: 0    Homeless in the Last Year: No  Utilities: Not At Risk (05/09/2024)   Epic    Threatened with loss of utilities: No  Health Literacy: Adequate Health Literacy (05/09/2024)   B1300 Health Literacy    Frequency of need for help with medical instructions: Never     Review of Systems  Constitutional:  Negative for appetite change and unexpected weight change.  HENT:  Negative for congestion and sinus pressure.   Respiratory:  Negative for cough, chest tightness and shortness of breath.   Cardiovascular:  Negative for chest pain, palpitations and leg swelling.  Gastrointestinal:  Negative for abdominal pain, nausea and vomiting.  Genitourinary:  Negative for difficulty urinating and dysuria.  Musculoskeletal:  Negative for myalgias.       Right elbow pain.   Skin:  Negative for color change and rash.  Neurological:  Negative for dizziness and headaches.  Psychiatric/Behavioral:  Negative for agitation and dysphoric mood.        Objective:     BP (!) 140/86   Pulse 62   Temp 98 F (36.7 C) (Oral)   Ht 5' 4 (1.626 m)   Wt 182 lb 12.8 oz (82.9 kg)   SpO2 98%   BMI 31.38 kg/m  Wt Readings from Last 3 Encounters:  07/23/24 182 lb 12.8 oz (82.9 kg)  07/14/24 179 lb (81.2 kg)  07/04/24 181 lb 3.2 oz  (82.2 kg)    Physical Exam Vitals reviewed.  Constitutional:      General: She is not in acute distress.    Appearance: Normal appearance.  HENT:     Head: Normocephalic and atraumatic.     Right Ear: External ear normal.     Left Ear: External ear normal.     Mouth/Throat:  Pharynx: No oropharyngeal exudate or posterior oropharyngeal erythema.  Eyes:     General: No scleral icterus.       Right eye: No discharge.        Left eye: No discharge.     Conjunctiva/sclera: Conjunctivae normal.  Neck:     Thyroid : No thyromegaly.  Cardiovascular:     Rate and Rhythm: Normal rate and regular rhythm.  Pulmonary:     Effort: No respiratory distress.     Breath sounds: Normal breath sounds. No wheezing.  Abdominal:     General: Bowel sounds are normal.     Palpations: Abdomen is soft.     Tenderness: There is no abdominal tenderness.  Musculoskeletal:     Cervical back: Neck supple. No tenderness.     Comments: Increased pain - palpation - right elbow. Some increased pain with rotation of forearm.  Lymphadenopathy:     Cervical: No cervical adenopathy.  Skin:    Findings: No erythema or rash.  Neurological:     Mental Status: She is alert.  Psychiatric:        Mood and Affect: Mood normal.        Behavior: Behavior normal.         Outpatient Encounter Medications as of 07/23/2024  Medication Sig   albuterol  (VENTOLIN  HFA) 108 (90 Base) MCG/ACT inhaler Inhale 2 puffs into the lungs every 4 (four) hours as needed for shortness of breath.   ALPRAZolam  (XANAX ) 0.25 MG tablet Take 1 tablet (0.25 mg total) by mouth daily as needed for anxiety.   azelastine  (ASTELIN ) 0.1 % nasal spray Place 1 spray into both nostrils 2 (two) times daily. Use in each nostril as directed   benzonatate  (TESSALON ) 200 MG capsule Take 1 capsule (200 mg total) by mouth 3 (three) times daily as needed.   fluticasone  (FLONASE ) 50 MCG/ACT nasal spray Place 2 sprays into both nostrils daily.   Multiple  Vitamins-Minerals (MULTIVITAMIN PO) Take by mouth daily.   famotidine  (PEPCID ) 20 MG tablet Take 1 tablet (20 mg total) by mouth daily before supper.   [DISCONTINUED] famotidine  (PEPCID ) 20 MG tablet Take 1 tablet (20 mg total) by mouth daily before supper.   No facility-administered encounter medications on file as of 07/23/2024.     Lab Results  Component Value Date   WBC 5.4 04/25/2024   HGB 14.2 04/25/2024   HCT 41.8 04/25/2024   PLT 210 04/25/2024   GLUCOSE 111 (H) 04/25/2024   CHOL 183 04/25/2024   TRIG 122 04/25/2024   HDL 50 04/25/2024   LDLCALC 111 (H) 04/25/2024   ALT 17 04/25/2024   AST 23 04/25/2024   NA 139 04/25/2024   K 4.2 04/25/2024   CL 106 04/25/2024   CREATININE 0.74 04/25/2024   BUN 16 04/25/2024   CO2 20 04/25/2024   TSH 1.330 04/25/2024   HGBA1C 5.7 (H) 04/25/2024    DG Chest 2 View Result Date: 07/14/2024 EXAM: 2 VIEW(S) XRAY OF THE CHEST 07/14/2024 10:36:42 AM COMPARISON: 11 / 9 / 23 CLINICAL HISTORY: cough, wheezing FINDINGS: LUNGS AND PLEURA: No focal pulmonary opacity. No pleural effusion. No pneumothorax. HEART AND MEDIASTINUM: No acute abnormality of the cardiac and mediastinal silhouettes. BONES AND SOFT TISSUES: No acute osseous abnormality. IMPRESSION: 1. No acute cardiopulmonary process. Electronically signed by: Norman Gatlin MD MD 07/14/2024 12:32 PM EST RP Workstation: [id number]       Assessment & Plan:  Right elbow pain Assessment & Plan: Persistent pain. Refer to ortho for  further evaluation.   Orders: -     Ambulatory referral to Orthopedic Surgery  Postmenopausal estrogen deficiency  Hypercholesterolemia Assessment & Plan: The 10-year ASCVD risk score (Arnett DK, et al., 2019) is: 13.7%   Values used to calculate the score:     Age: 52 years     Clinically relevant sex: Female     Is Non-Hispanic African American: No     Diabetic: No     Tobacco smoker: No     Systolic Blood Pressure: 140 mmHg     Is BP treated: No      HDL Cholesterol: 50 mg/dL     Total Cholesterol: 183 mg/dL  Have discussed low cholesterol diet and exercise. Has declined cholesterol medication. Follow lipid panel.   Orders: -     Lipid panel; Future -     Hepatic function panel; Future -     Basic metabolic panel with GFR; Future  Multinodular goiter Assessment & Plan: Saw ENT (Dr Milissa). Thyroid  ultrasound (10/2022)-  Similar findings of multinodular goiter. No definitive worrisome new or enlarging thyroid  nodules. None of the discretely measured thyroid  nodules/pseudo nodules, many of which appear similar to the 2009 examination, meet imaging criteria to recommend percutaneous sampling or continued dedicated follow-up. Follow tsh.    Hyperglycemia Assessment & Plan: Low carb diet and exercise. Follow met b and A1c.   Lab Results  Component Value Date   HGBA1C 5.7 (H) 04/25/2024     Orders: -     Hemoglobin A1c; Future  Cough, unspecified type Assessment & Plan: Recent diagnosis of influenza. Overall improved. Some residual drainage contributing to occasional cough. No sob. Feels from cardiac standpoint - stable. Continue flonase  nasal spray. Add astelin  nasal spray. Follow.  Call with update. Pepcid . Follow.    Gastroesophageal reflux disease, unspecified whether esophagitis present Assessment & Plan: Pepcid  as directed. Follow.    History of colonic polyps Assessment & Plan: Colonoscopy 10/20/19 - two diminutive polyps in the rectum. Non bleeding internal hemorrhoids. hyperplastic polyp.  Recommended f/u colonoscopy in 5 years.    Stress Assessment & Plan: Overall appears to be doing well. Follow.    Other orders -     Famotidine ; Take 1 tablet (20 mg total) by mouth daily before supper.  Dispense: 90 tablet; Refill: 1 -     Azelastine  HCl; Place 1 spray into both nostrils 2 (two) times daily. Use in each nostril as directed  Dispense: 30 mL; Refill: 0     Allena Hamilton, MD "

## 2024-07-23 NOTE — Patient Instructions (Signed)
 Continue flonase  nasal spray - 2 sprays each nostril one time per day.   Astelin  nasal spray - 1 spray each nostril twice a day.

## 2024-07-27 ENCOUNTER — Encounter: Payer: Self-pay | Admitting: Internal Medicine

## 2024-07-27 NOTE — Assessment & Plan Note (Signed)
 Saw ENT (Dr Milissa). Thyroid  ultrasound (10/2022)-  Similar findings of multinodular goiter. No definitive worrisome new or enlarging thyroid  nodules. None of the discretely measured thyroid  nodules/pseudo nodules, many of which appear similar to the 2009 examination, meet imaging criteria to recommend percutaneous sampling or continued dedicated follow-up. Follow tsh.

## 2024-07-27 NOTE — Assessment & Plan Note (Signed)
 Overall appears to be doing well.  Follow.

## 2024-07-27 NOTE — Assessment & Plan Note (Signed)
 Pepcid  as directed. Follow.

## 2024-07-27 NOTE — Assessment & Plan Note (Signed)
 Low carb diet and exercise. Follow met b and A1c.   Lab Results  Component Value Date   HGBA1C 5.7 (H) 04/25/2024

## 2024-07-27 NOTE — Assessment & Plan Note (Signed)
 Colonoscopy 10/20/19 - two diminutive polyps in the rectum. Non bleeding internal hemorrhoids. hyperplastic polyp.  Recommended f/u colonoscopy in 5 years.

## 2024-07-27 NOTE — Assessment & Plan Note (Signed)
 Persistent pain. Refer to ortho for further evaluation.

## 2024-07-27 NOTE — Assessment & Plan Note (Signed)
 The 10-year ASCVD risk score (Arnett DK, et al., 2019) is: 13.7%   Values used to calculate the score:     Age: 74 years     Clinically relevant sex: Female     Is Non-Hispanic African American: No     Diabetic: No     Tobacco smoker: No     Systolic Blood Pressure: 140 mmHg     Is BP treated: No     HDL Cholesterol: 50 mg/dL     Total Cholesterol: 183 mg/dL  Have discussed low cholesterol diet and exercise. Has declined cholesterol medication. Follow lipid panel.

## 2024-07-27 NOTE — Assessment & Plan Note (Addendum)
 Recent diagnosis of influenza. Overall improved. Some residual drainage contributing to occasional cough. No sob. Feels from cardiac standpoint - stable. Continue flonase  nasal spray. Add astelin  nasal spray. Follow.  Call with update. Pepcid . Follow.

## 2024-08-05 ENCOUNTER — Other Ambulatory Visit: Payer: Self-pay | Admitting: General Practice

## 2024-08-05 DIAGNOSIS — R0981 Nasal congestion: Secondary | ICD-10-CM

## 2024-08-13 ENCOUNTER — Other Ambulatory Visit

## 2024-10-01 ENCOUNTER — Encounter: Admitting: Internal Medicine

## 2025-05-13 ENCOUNTER — Ambulatory Visit
